# Patient Record
Sex: Female | Born: 1960 | Race: White | Hispanic: No | Smoking: Never smoker
Health system: Southern US, Community
[De-identification: ages and names within clinical notes are randomized; demographics above are authoritative.]

## PROBLEM LIST (undated history)

## (undated) DIAGNOSIS — F419 Anxiety disorder, unspecified: Secondary | ICD-10-CM

## (undated) DIAGNOSIS — E119 Type 2 diabetes mellitus without complications: Secondary | ICD-10-CM

## (undated) DIAGNOSIS — E7800 Pure hypercholesterolemia, unspecified: Secondary | ICD-10-CM

## (undated) DIAGNOSIS — F329 Major depressive disorder, single episode, unspecified: Secondary | ICD-10-CM

## (undated) DIAGNOSIS — G4733 Obstructive sleep apnea (adult) (pediatric): Secondary | ICD-10-CM

## (undated) DIAGNOSIS — F32A Depression, unspecified: Secondary | ICD-10-CM

## (undated) HISTORY — DX: Type 2 diabetes mellitus without complications: E11.9

## (undated) HISTORY — PX: TONSILLECTOMY: SUR1361

## (undated) HISTORY — PX: ABDOMINAL SURGERY: SHX537

## (undated) HISTORY — DX: Obstructive sleep apnea (adult) (pediatric): G47.33

---
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
## 2006-06-24 HISTORY — PX: APPENDECTOMY: SHX54

## 2007-11-20 ENCOUNTER — Emergency Department (HOSPITAL_COMMUNITY): Admission: EM | Admit: 2007-11-20 | Discharge: 2007-11-20 | Payer: Self-pay | Admitting: Emergency Medicine

## 2007-11-28 ENCOUNTER — Emergency Department (HOSPITAL_COMMUNITY): Admission: EM | Admit: 2007-11-28 | Discharge: 2007-11-28 | Payer: Self-pay | Admitting: Family Medicine

## 2008-01-03 ENCOUNTER — Emergency Department (HOSPITAL_COMMUNITY): Admission: EM | Admit: 2008-01-03 | Discharge: 2008-01-03 | Payer: Self-pay | Admitting: Emergency Medicine

## 2008-01-12 ENCOUNTER — Ambulatory Visit: Payer: Self-pay | Admitting: Internal Medicine

## 2008-01-12 LAB — CONVERTED CEMR LAB
Calcium: 9.8 mg/dL (ref 8.4–10.5)
GC Probe Amp, Urine: NEGATIVE
Sodium: 141 meq/L (ref 135–145)
TSH: 0.985 microintl units/mL (ref 0.350–4.50)

## 2008-01-13 ENCOUNTER — Encounter (INDEPENDENT_AMBULATORY_CARE_PROVIDER_SITE_OTHER): Payer: Self-pay | Admitting: Internal Medicine

## 2008-01-17 ENCOUNTER — Ambulatory Visit (HOSPITAL_COMMUNITY): Admission: RE | Admit: 2008-01-17 | Discharge: 2008-01-17 | Payer: Self-pay | Admitting: Internal Medicine

## 2008-01-23 ENCOUNTER — Ambulatory Visit: Payer: Self-pay | Admitting: *Deleted

## 2008-11-29 ENCOUNTER — Ambulatory Visit: Payer: Self-pay | Admitting: Family Medicine

## 2009-02-22 ENCOUNTER — Ambulatory Visit: Payer: Self-pay | Admitting: Internal Medicine

## 2009-02-22 ENCOUNTER — Encounter: Payer: Self-pay | Admitting: Internal Medicine

## 2009-02-22 LAB — CONVERTED CEMR LAB: Chlamydia, DNA Probe: NEGATIVE

## 2009-03-13 ENCOUNTER — Ambulatory Visit: Payer: Self-pay | Admitting: Internal Medicine

## 2009-08-30 ENCOUNTER — Ambulatory Visit: Payer: Self-pay | Admitting: Internal Medicine

## 2010-09-22 ENCOUNTER — Inpatient Hospital Stay (INDEPENDENT_AMBULATORY_CARE_PROVIDER_SITE_OTHER)
Admission: RE | Admit: 2010-09-22 | Discharge: 2010-09-22 | Disposition: A | Discharge status: Home / Self Care | Payer: Self-pay | Source: Ambulatory Visit | Attending: Emergency Medicine | Admitting: Emergency Medicine

## 2010-09-22 DIAGNOSIS — M659 Synovitis and tenosynovitis, unspecified: Secondary | ICD-10-CM

## 2010-10-31 ENCOUNTER — Emergency Department (HOSPITAL_COMMUNITY)
Admission: EM | Admit: 2010-10-31 | Discharge: 2010-11-01 | Disposition: A | Discharge status: Other Acute Inpatient Hospital | Payer: Self-pay | Attending: Emergency Medicine | Admitting: Emergency Medicine

## 2010-10-31 DIAGNOSIS — F3289 Other specified depressive episodes: Secondary | ICD-10-CM | POA: Insufficient documentation

## 2010-10-31 DIAGNOSIS — F329 Major depressive disorder, single episode, unspecified: Secondary | ICD-10-CM | POA: Insufficient documentation

## 2010-10-31 DIAGNOSIS — R45851 Suicidal ideations: Secondary | ICD-10-CM | POA: Insufficient documentation

## 2010-10-31 LAB — COMPREHENSIVE METABOLIC PANEL
ALT: 21 U/L (ref 0–35)
AST: 17 U/L (ref 0–37)
Albumin: 3.4 g/dL — ABNORMAL LOW (ref 3.5–5.2)
Alkaline Phosphatase: 41 U/L (ref 39–117)
BUN: 21 mg/dL (ref 6–23)
Chloride: 108 mEq/L (ref 96–112)
Potassium: 4.3 mEq/L (ref 3.5–5.1)
Sodium: 141 mEq/L (ref 135–145)
Total Bilirubin: 0.3 mg/dL (ref 0.3–1.2)

## 2010-10-31 LAB — CBC
HCT: 45.3 % (ref 36.0–46.0)
MCHC: 32 g/dL (ref 30.0–36.0)
RDW: 14.2 % (ref 11.5–15.5)

## 2010-10-31 LAB — DIFFERENTIAL
Basophils Absolute: 0.1 10*3/uL (ref 0.0–0.1)
Basophils Relative: 1 % (ref 0–1)
Eosinophils Relative: 3 % (ref 0–5)
Lymphocytes Relative: 33 % (ref 12–46)
Monocytes Absolute: 1 10*3/uL (ref 0.1–1.0)

## 2010-10-31 LAB — RAPID URINE DRUG SCREEN, HOSP PERFORMED
Opiates: NOT DETECTED
Tetrahydrocannabinol: NOT DETECTED

## 2010-11-01 ENCOUNTER — Inpatient Hospital Stay (HOSPITAL_COMMUNITY)
Admission: RE | Admit: 2010-11-01 | Discharge: 2010-11-02 | DRG: 881 | Disposition: A | Discharge status: Home / Self Care | Payer: PRIVATE HEALTH INSURANCE | Source: Intra-hospital | Attending: Psychiatry | Admitting: Psychiatry

## 2010-11-01 DIAGNOSIS — E669 Obesity, unspecified: Secondary | ICD-10-CM

## 2010-11-01 DIAGNOSIS — F411 Generalized anxiety disorder: Secondary | ICD-10-CM

## 2010-11-01 DIAGNOSIS — R7309 Other abnormal glucose: Secondary | ICD-10-CM

## 2010-11-01 DIAGNOSIS — F3289 Other specified depressive episodes: Principal | ICD-10-CM

## 2010-11-01 DIAGNOSIS — F1410 Cocaine abuse, uncomplicated: Secondary | ICD-10-CM

## 2010-11-01 DIAGNOSIS — F4310 Post-traumatic stress disorder, unspecified: Secondary | ICD-10-CM

## 2010-11-01 DIAGNOSIS — R45851 Suicidal ideations: Secondary | ICD-10-CM

## 2010-11-01 DIAGNOSIS — F603 Borderline personality disorder: Secondary | ICD-10-CM

## 2010-11-01 DIAGNOSIS — F329 Major depressive disorder, single episode, unspecified: Principal | ICD-10-CM

## 2010-11-01 DIAGNOSIS — K219 Gastro-esophageal reflux disease without esophagitis: Secondary | ICD-10-CM
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
## 2011-03-20 LAB — URINALYSIS, ROUTINE W REFLEX MICROSCOPIC
Bilirubin Urine: NEGATIVE
Glucose, UA: NEGATIVE
Hgb urine dipstick: NEGATIVE
Specific Gravity, Urine: 1.025
pH: 5.5

## 2011-03-20 LAB — POCT PREGNANCY, URINE: Preg Test, Ur: NEGATIVE

## 2011-03-21 LAB — POCT PREGNANCY, URINE
Operator id: 239701
Preg Test, Ur: NEGATIVE

## 2011-03-21 LAB — POCT URINALYSIS DIP (DEVICE)
Bilirubin Urine: NEGATIVE
Glucose, UA: NEGATIVE
Ketones, ur: NEGATIVE
Nitrite: NEGATIVE
Operator id: 239701

## 2011-09-04 ENCOUNTER — Other Ambulatory Visit: Payer: Self-pay

## 2011-09-04 ENCOUNTER — Emergency Department (HOSPITAL_COMMUNITY): Payer: Self-pay

## 2011-09-04 ENCOUNTER — Encounter (HOSPITAL_COMMUNITY): Payer: Self-pay

## 2011-09-04 ENCOUNTER — Emergency Department (HOSPITAL_COMMUNITY)
Admission: EM | Admit: 2011-09-04 | Discharge: 2011-09-04 | Disposition: A | Discharge status: Home / Self Care | Payer: Self-pay | Attending: Emergency Medicine | Admitting: Emergency Medicine

## 2011-09-04 DIAGNOSIS — E7800 Pure hypercholesterolemia, unspecified: Secondary | ICD-10-CM | POA: Insufficient documentation

## 2011-09-04 DIAGNOSIS — M79609 Pain in unspecified limb: Secondary | ICD-10-CM | POA: Insufficient documentation

## 2011-09-04 DIAGNOSIS — G43909 Migraine, unspecified, not intractable, without status migrainosus: Secondary | ICD-10-CM | POA: Insufficient documentation

## 2011-09-04 DIAGNOSIS — R071 Chest pain on breathing: Secondary | ICD-10-CM | POA: Insufficient documentation

## 2011-09-04 DIAGNOSIS — F341 Dysthymic disorder: Secondary | ICD-10-CM | POA: Insufficient documentation

## 2011-09-04 DIAGNOSIS — R110 Nausea: Secondary | ICD-10-CM | POA: Insufficient documentation

## 2011-09-04 DIAGNOSIS — R0789 Other chest pain: Secondary | ICD-10-CM

## 2011-09-04 DIAGNOSIS — M25519 Pain in unspecified shoulder: Secondary | ICD-10-CM | POA: Insufficient documentation

## 2011-09-04 HISTORY — DX: Major depressive disorder, single episode, unspecified: F32.9

## 2011-09-04 HISTORY — DX: Depression, unspecified: F32.A

## 2011-09-04 HISTORY — DX: Pure hypercholesterolemia, unspecified: E78.00

## 2011-09-04 HISTORY — DX: Anxiety disorder, unspecified: F41.9

## 2011-09-04 LAB — DIFFERENTIAL
Basophils Relative: 1 % (ref 0–1)
Lymphs Abs: 1.3 10*3/uL (ref 0.7–4.0)
Monocytes Relative: 17 % — ABNORMAL HIGH (ref 3–12)
Neutro Abs: 5 10*3/uL (ref 1.7–7.7)
Neutrophils Relative %: 64 % (ref 43–77)

## 2011-09-04 LAB — RAPID URINE DRUG SCREEN, HOSP PERFORMED
Amphetamines: NOT DETECTED
Barbiturates: NOT DETECTED
Benzodiazepines: NOT DETECTED
Cocaine: NOT DETECTED
Tetrahydrocannabinol: NOT DETECTED

## 2011-09-04 LAB — COMPREHENSIVE METABOLIC PANEL
ALT: 32 U/L (ref 0–35)
Albumin: 3.5 g/dL (ref 3.5–5.2)
Alkaline Phosphatase: 40 U/L (ref 39–117)
BUN: 15 mg/dL (ref 6–23)
Chloride: 105 mEq/L (ref 96–112)
Potassium: 4.7 mEq/L (ref 3.5–5.1)
Sodium: 138 mEq/L (ref 135–145)
Total Bilirubin: 0.5 mg/dL (ref 0.3–1.2)
Total Protein: 7.6 g/dL (ref 6.0–8.3)

## 2011-09-04 LAB — CBC
Hemoglobin: 15 g/dL (ref 12.0–15.0)
MCHC: 33.2 g/dL (ref 30.0–36.0)
Platelets: 268 10*3/uL (ref 150–400)
RBC: 5.01 MIL/uL (ref 3.87–5.11)

## 2011-09-04 LAB — POCT I-STAT TROPONIN I: Troponin i, poc: 0 ng/mL (ref 0.00–0.08)

## 2011-09-04 LAB — D-DIMER, QUANTITATIVE: D-Dimer, Quant: 0.32 ug/mL-FEU (ref 0.00–0.48)

## 2011-09-04 MED ORDER — DIPHENHYDRAMINE HCL 50 MG/ML IJ SOLN
25.0000 mg | Freq: Once | INTRAMUSCULAR | Status: AC
  Administered 2011-09-04: 09:00:00 via INTRAVENOUS
  Filled 2011-09-04: qty 2

## 2011-09-04 MED ORDER — DIAZEPAM 5 MG PO TABS: 5.0000 mg | ORAL_TABLET | Freq: Two times a day (BID) | ORAL | Status: DC

## 2011-09-04 MED ORDER — NAPROXEN 500 MG PO TABS: 500.0000 mg | ORAL_TABLET | Freq: Two times a day (BID) | ORAL | Status: DC

## 2011-09-04 MED ORDER — METOCLOPRAMIDE HCL 5 MG/ML IJ SOLN
10.0000 mg | Freq: Once | INTRAMUSCULAR | Status: AC
  Administered 2011-09-04: 10 mg via INTRAVENOUS
  Filled 2011-09-04: qty 2

## 2011-09-04 MED ORDER — KETOROLAC TROMETHAMINE 30 MG/ML IJ SOLN
30.0000 mg | Freq: Once | INTRAMUSCULAR | Status: AC
  Administered 2011-09-04: 30 mg via INTRAVENOUS
  Filled 2011-09-04: qty 1

## 2011-09-04 MED ORDER — DEXAMETHASONE SODIUM PHOSPHATE 10 MG/ML IJ SOLN
10.0000 mg | Freq: Once | INTRAMUSCULAR | Status: AC
  Administered 2011-09-04: 10 mg via INTRAVENOUS
  Filled 2011-09-04: qty 1

## 2011-09-04 NOTE — ED Notes (Addendum)
Chest pain increases with any pressure or movement.  Pt denies nausea, vomiting sweating.  Pt states ibuprofen has not helped the headache. Pt reports MI 7 yrs ago and the pain is similar. Pt reports having EKG on 9th of march.  Pt states [facility] "didnt like" the EKG and wanted her to have stress test but pt has not scheduled.

## 2011-09-04 NOTE — ED Provider Notes (Signed)
Medical screening examination/treatment/procedure(s) were performed by non-physician practitioner and as supervising physician I was immediately available for consultation/collaboration. Devoria Albe, MD, Armando Gang   Ward Givens, MD 09/04/11 [phone]

## 2011-09-04 NOTE — ED Notes (Signed)
Pt presents with 1 month h/o mid-sternal chest tightness.  Pt reports pain has been constant, radiates into L arm and straight through to mid-scapula.  -shortness of breath.  Pt reports productive cough with clear phlegm x 3-4 days.  Migraine headache started x 2 days ago that begins to frontal area and radiates to occipital area.  +nausea and photophobia.

## 2011-09-04 NOTE — ED Provider Notes (Signed)
History     CSN: [id number]  Arrival date & time 09/04/11  4098   None     Chief Complaint  Patient presents with  . Chest Pain    (Consider location/radiation/quality/duration/timing/severity/associated sxs/prior treatment) The history is provided by the patient.  Pt presents with 1 month of chest tightness to the mid sternal area. Described as sharp, stabbing. The pain seems to be constant in nature and is not positional. Seems to get mildly worse with deep breaths. Occasionally radiates into the L arm, shoulder. Denies associated dyspnea, diaphoresis. Admits occasional SOB with walking long distances which does not seem related to pain. Denies hemoptysis but has had productive cough (clear phlegm) for past 4 days. Denies f/c. No n/v/d. Patient has no prior history of DVT/PE. Denies recent trauma, surgery, or prolonged immobilization. Denies hemoptysis or use of exogenous estrogen. Denies leg swelling. Has been taking ibuprofen with no relief. States she has hx of MI seven years ago for which she was txed in [place]. Does not think she received a stent. Is not currently followed by cards as she does not have insurance.   Additionally c/o migraine HA. Has hx of the same. States this started 2 days ago at occiput, radiates to frontal area bilaterally, which is consistent with previous. Denies neck pain, stiffness. She has associated nausea without emesis. Additionally complains of photophobia and phonophobia. Denies difficulty walking, syncope.  Past Medical History  Diagnosis Date  . Depression   . Anxiety   . Migraine   . Hypercholesteremia   . Endometriosis     Past Surgical History  Procedure Date  . Appendectomy   . Abdominal surgery   . Tonsillectomy     No family history on file.  History  Substance Use Topics  . Smoking status: Never Smoker   . Smokeless tobacco: Not on file  . Alcohol Use: No    OB History    Grav Para Term Preterm Abortions TAB SAB Ect Mult Living                    Review of Systems  Constitutional: Negative for fever, chills, activity change and appetite change.  HENT: Negative for congestion, sore throat, facial swelling, rhinorrhea, neck pain and neck stiffness.   Eyes: Negative for photophobia.  Respiratory: Negative for chest tightness and shortness of breath.   Cardiovascular: Positive for chest pain. Negative for palpitations and leg swelling.  Gastrointestinal: Positive for nausea. Negative for vomiting and abdominal pain.  Genitourinary: Negative.   Musculoskeletal: Negative for myalgias.  Skin: Negative for color change and rash.  Neurological: Positive for headaches. Negative for dizziness, seizures, speech difficulty, weakness and numbness.    Allergies  Review of patient's allergies indicates not on file.  Home Medications  No current outpatient prescriptions on file.  BP 138/79  Pulse 110  Temp(Src) 100.1 F (37.8 C) (Oral)  Resp 22  Ht 5\' 2"  (1.575 m)  Wt 225 lb (102.059 kg)  BMI 41.15 kg/m2  SpO2 94% Patient's HR on monitor while I was examining her is 90.  Physical Exam  Nursing note and vitals reviewed. Constitutional: She is oriented to person, place, and time. She appears well-developed and well-nourished. No distress.  HENT:  Head: Normocephalic and atraumatic.  Right Ear: External ear normal.  Left Ear: External ear normal.  Mouth/Throat: Oropharynx is clear and moist. No oropharyngeal exudate.       Scalp nontender to palp, no sinus tenderness  Eyes: Conjunctivae and  EOM are normal. Pupils are equal, round, and reactive to light.  Neck: Normal range of motion. Neck supple. No muscular tenderness present. No Brudzinski's sign and no Kernig's sign noted.  Cardiovascular: Normal rate, regular rhythm and normal heart sounds.   Pulmonary/Chest: Effort normal and breath sounds normal. She exhibits tenderness.       Reproducible chest pain over midsternum  Abdominal: Soft. Bowel sounds are  normal. There is no tenderness.  Musculoskeletal: Normal range of motion. She exhibits no edema and no tenderness.       nontender to calf palpation  Neurological: She is alert and oriented to person, place, and time. She displays normal reflexes. No cranial nerve deficit. She exhibits normal muscle tone. Coordination normal.  Skin: Skin is warm and dry. No rash noted. She is not diaphoretic.  Psychiatric: She has a normal mood and affect.    ED Course  Procedures (including critical care time)   Date: 09/04/2011  Rate: 110  Rhythm: sinus tachycardia and premature ventricular contractions (PVC)  QRS Axis: normal  Intervals: normal  ST/T Wave abnormalities: normal  Conduction Disutrbances:none  Narrative Interpretation:   Old EKG Reviewed: none available    Labs Reviewed  DIFFERENTIAL - Abnormal; Notable for the following:    Monocytes Relative 17 (*)    Monocytes Absolute 1.3 (*)    All other components within normal limits  COMPREHENSIVE METABOLIC PANEL - Abnormal; Notable for the following:    Glucose, Bld 107 (*)    GFR calc non Af Amer 76 (*)    GFR calc Af Amer 89 (*)    All other components within normal limits  D-DIMER, QUANTITATIVE  CBC  POCT I-STAT TROPONIN I  URINE RAPID DRUG SCREEN (HOSP PERFORMED)   Dg Chest 2 View  09/04/2011  *RADIOLOGY REPORT*  Clinical Data: Chest pain.  CHEST - 2 VIEW  Comparison: No priors.  Findings: Lung volumes are normal.  No consolidative airspace disease.  No pleural effusions.  No pneumothorax.  No pulmonary nodule or mass noted.  Pulmonary vasculature and the cardiomediastinal silhouette are within normal limits. Atherosclerotic calcifications within the arch of the aorta. Mild elevation of right hemidiaphragm.  IMPRESSION: 1. No radiographic evidence of acute cardiopulmonary disease. 2.  Atherosclerosis. 3.  Mild elevation of right hemidiaphragm.  Original Report Authenticated By: Florencia Reasons, M.D.     1. Migraine   2.  Chest wall pain       MDM  Old records reviewed. Office visit from March 2012 indicates that the patient has a history of cocaine abuse. UDS performed today was negative. Pt given migraine cocktail of Decadron/Reglan/Benadryl which almost completely relieved her pain. She was given Toradol as well and reported analgesia with this for both her HA and CP. Given the timing of sx of CP, suspicion for ACS lowered. Pain is reproducible to palpation. Will treat as poss chest wall pain. Pt was encouraged to establish care with [facility] for f/u. Strict return precautions discussed.       Grant Fontana, [place] 09/04/11 1614

## 2011-09-04 NOTE — Discharge Instructions (Signed)
Your labs today were normal. Please make a follow up appointment with cardiology. Return to the ED immediately for worsening pain, high fever, or other worrisome symptoms.  RESOURCE GUIDE  Dental Problems  Patients with Medicaid: [facility] [phone] [address]                                           [phone] [address] Phone:  [phone]                                                  Phone:  [phone]  If unable to pay or uninsured, contact:  [facility] or [facility]. to become qualified for the adult dental clinic.  Chronic Pain Problems Contact [facility]  [phone] Patients need to be referred by their primary care doctor.  Insufficient Money for Medicine Contact [employer]:  call "211" or [facility] [phone].  No Primary Care Doctor Call [facility]  [phone] Other agencies that provide inexpensive medical care    [facility]  [phone]    [facility]  [phone]    [facility]  [phone]    [facility]  [phone]    [facility]  [phone]    [facility]  [phone]  Psychological Services [facility]  [phone] [facility]  [phone] [facility]   [phone] (emergency services [phone])  Substance Abuse Resources [facility]  [phone] [facility] [phone] [facility] [phone] Floydene Flock ([phone] Residential & Outpatient Substance Abuse Program  ([phone]  Abuse/Neglect [facility] Child Abuse Hotline (864) 533-4609 [facility] Child Abuse Hotline 782-178-5191 (After Hours)  Emergency Shelter [facility] [phone]  [facility] [phone] [facility] 6696828798  MRSA Hotline #:   (856) 090-8731    [facility]  Resources  [facility]     [employer]                          [facility]. [address] [place]                       [address]      [address]  [place]                                                Cristobal Goldmann Phone:  [phone]                                   Phone:  [phone]                 Phone:  [phone]  [facility] Phone:  [phone]  [facility] Child Abuse Hotline 385-660-9796 978-414-9671 (After Hours)  Chest Pain (Nonspecific) Chest pain has many causes. Your pain could be caused by something serious, such as a heart attack or a blood clot in the lungs. It could also be caused by something less serious, such as a chest bruise or a virus. Follow up with your doctor. More lab tests or other studies may be needed to find the cause of your pain. Most of the time, nonspecific chest pain will improve within 2 to 3 days of rest and mild pain medicine. HOME CARE  For chest bruises, you may put ice on the sore area for 15 to 20 minutes, 3 to 4 times a day. Do this only if it makes you or your child feel better.   Put ice in a plastic bag.   Place a towel between the skin and the bag.   Rest for the next 2 to 3 days.   Go back to work if the pain improves.   See your doctor if the pain lasts longer than 1 to 2 weeks.   Only take medicine as told by your doctor.   Quit smoking if you smoke.  GET HELP RIGHT AWAY IF:   There is more pain or pain that spreads to the arm, neck, jaw, back, or belly (abdomen).   You or your child has shortness of breath.   You or your child coughs more than usual or coughs up blood.   You or your child has very bad back or belly pain, feels sick to his or her stomach (nauseous), or throws up (vomits).   You or your child has very bad weakness.   You or your child passes out (faints).   You or your child has a  temperature by mouth above 102 F (38.9 C), not controlled by medicine.  Any of these problems may be serious and may be an emergency. Do not wait to see if the problems will go away. Get medical help right away. Call your local emergency services 911 in [place] Do not drive yourself to the hospital. MAKE SURE YOU:   Understand these instructions.   Will watch this condition.   Will get help right away if you or your child is not doing well or gets worse.  Document Released: 11/27/2007 Document Revised: 05/30/2011 Document Reviewed: 11/27/2007 [facility] Patient Information 2012 [place], [place].  Migraine Headache A migraine headache is an intense, throbbing pain on one or both sides of your head. The exact cause of a migraine headache is not always known. A migraine may be caused when nerves in the brain become irritated and release chemicals that cause swelling within blood vessels, causing pain. Many migraine sufferers have a family history of migraines. Before you get a migraine you may or may not get an aura. An aura is a group of symptoms that can predict the beginning of a migraine. An aura may include:  Visual changes such as:   Flashing lights.   Bright spots or zig-zag lines.   Tunnel vision.   Feelings of numbness.   Trouble talking.   Muscle weakness.  SYMPTOMS  Pain on one or both sides of your head.   Pain that is pulsating or throbbing in nature.   Pain that is severe enough to prevent  daily activities.   Pain that is aggravated by any daily physical activity.   Nausea (feeling sick to your stomach), vomiting, or both.   Pain with exposure to bright lights, loud noises, or activity.   General sensitivity to bright lights or loud noises.  MIGRAINE TRIGGERS Examples of triggers of migraine headaches include:   Alcohol.   Smoking.   Stress.   It may be related to menses (female menstruation).   Aged cheeses.   Foods or drinks that contain nitrates,  glutamate, aspartame, or tyramine.   Lack of sleep.   Chocolate.   Caffeine.   Hunger.   Medications such as nitroglycerine (used to treat chest pain), birth control pills, estrogen, and some blood pressure medications.  DIAGNOSIS  A migraine headache is often diagnosed based on:  Symptoms.   Physical examination.   A computerized X-ray scan (computed tomography, CT) of your head.  TREATMENT  Medications can help prevent migraines if they are recurrent or should they become recurrent. Your caregiver can help you with a medication or treatment program that will be helpful to you.   Lying down in a dark, quiet room may be helpful.   Keeping a headache diary may help you find a trend as to what may be triggering your headaches.  SEEK IMMEDIATE MEDICAL CARE IF:   You have confusion, personality changes or seizures.   You have headaches that wake you from sleep.   You have an increased frequency in your headaches.   You have a stiff neck.   You have a loss of vision.   You have muscle weakness.   You start losing your balance or have trouble walking.   You feel faint or pass out.  MAKE SURE YOU:   Understand these instructions.   Will watch your condition.   Will get help right away if you are not doing well or get worse.  Document Released: 06/10/2005 Document Revised: 05/30/2011 Document Reviewed: 01/24/2009 [facility] Patient Information 2012 [place], [place].

## 2011-09-06 ENCOUNTER — Emergency Department (HOSPITAL_COMMUNITY): Payer: Self-pay

## 2011-09-06 ENCOUNTER — Encounter (HOSPITAL_COMMUNITY): Payer: Self-pay

## 2011-09-06 ENCOUNTER — Other Ambulatory Visit: Payer: Self-pay

## 2011-09-06 ENCOUNTER — Emergency Department (HOSPITAL_COMMUNITY)
Admission: EM | Admit: 2011-09-06 | Discharge: 2011-09-06 | Disposition: A | Discharge status: Home / Self Care | Payer: Self-pay | Attending: Emergency Medicine | Admitting: Emergency Medicine

## 2011-09-06 DIAGNOSIS — R059 Cough, unspecified: Secondary | ICD-10-CM | POA: Insufficient documentation

## 2011-09-06 DIAGNOSIS — M940 Chondrocostal junction syndrome [Tietze]: Secondary | ICD-10-CM | POA: Insufficient documentation

## 2011-09-06 DIAGNOSIS — Z79899 Other long term (current) drug therapy: Secondary | ICD-10-CM | POA: Insufficient documentation

## 2011-09-06 DIAGNOSIS — R071 Chest pain on breathing: Secondary | ICD-10-CM | POA: Insufficient documentation

## 2011-09-06 DIAGNOSIS — E7800 Pure hypercholesterolemia, unspecified: Secondary | ICD-10-CM | POA: Insufficient documentation

## 2011-09-06 DIAGNOSIS — J40 Bronchitis, not specified as acute or chronic: Secondary | ICD-10-CM | POA: Insufficient documentation

## 2011-09-06 DIAGNOSIS — R0789 Other chest pain: Secondary | ICD-10-CM

## 2011-09-06 DIAGNOSIS — R110 Nausea: Secondary | ICD-10-CM | POA: Insufficient documentation

## 2011-09-06 DIAGNOSIS — R5381 Other malaise: Secondary | ICD-10-CM | POA: Insufficient documentation

## 2011-09-06 DIAGNOSIS — R0602 Shortness of breath: Secondary | ICD-10-CM | POA: Insufficient documentation

## 2011-09-06 DIAGNOSIS — F341 Dysthymic disorder: Secondary | ICD-10-CM | POA: Insufficient documentation

## 2011-09-06 DIAGNOSIS — R509 Fever, unspecified: Secondary | ICD-10-CM | POA: Insufficient documentation

## 2011-09-06 DIAGNOSIS — R05 Cough: Secondary | ICD-10-CM | POA: Insufficient documentation

## 2011-09-06 LAB — DIFFERENTIAL
Basophils Relative: 1 % (ref 0–1)
Lymphs Abs: 1.6 10*3/uL (ref 0.7–4.0)
Monocytes Absolute: 0.6 10*3/uL (ref 0.1–1.0)
Monocytes Relative: 8 % (ref 3–12)
Neutro Abs: 5.5 10*3/uL (ref 1.7–7.7)
Neutrophils Relative %: 71 % (ref 43–77)

## 2011-09-06 LAB — BASIC METABOLIC PANEL
BUN: 20 mg/dL (ref 6–23)
Chloride: 104 mEq/L (ref 96–112)
Creatinine, Ser: 1.07 mg/dL (ref 0.50–1.10)
GFR calc Af Amer: 69 mL/min — ABNORMAL LOW (ref >90)
Glucose, Bld: 96 mg/dL (ref 70–99)
Potassium: 4 mEq/L (ref 3.5–5.1)

## 2011-09-06 LAB — CBC
HCT: 45.6 % (ref 36.0–46.0)
Hemoglobin: 14.4 g/dL (ref 12.0–15.0)
MCH: 28.6 pg (ref 26.0–34.0)
MCHC: 31.6 g/dL (ref 30.0–36.0)
RBC: 5.03 MIL/uL (ref 3.87–5.11)

## 2011-09-06 LAB — APTT: aPTT: 29 seconds (ref 24–37)

## 2011-09-06 LAB — CARDIAC PANEL(CRET KIN+CKTOT+MB+TROPI): Relative Index: 1.4 (ref 0.0–2.5)

## 2011-09-06 MED ORDER — MOXIFLOXACIN HCL 400 MG PO TABS
400.0000 mg | ORAL_TABLET | Freq: Once | ORAL | Status: AC
  Administered 2011-09-06: 400 mg via ORAL
  Filled 2011-09-06: qty 1

## 2011-09-06 MED ORDER — SODIUM CHLORIDE 0.9 % IV SOLN: INTRAVENOUS | Status: DC

## 2011-09-06 MED ORDER — HYDROCODONE-ACETAMINOPHEN 5-325 MG PO TABS
2.0000 | ORAL_TABLET | Freq: Once | ORAL | Status: DC
  Filled 2011-09-06: qty 1

## 2011-09-06 MED ORDER — MORPHINE SULFATE 4 MG/ML IJ SOLN
4.0000 mg | Freq: Once | INTRAMUSCULAR | Status: AC
  Administered 2011-09-06: 4 mg via INTRAVENOUS
  Filled 2011-09-06: qty 1

## 2011-09-06 MED ORDER — ALBUTEROL SULFATE HFA 108 (90 BASE) MCG/ACT IN AERS
2.0000 | INHALATION_SPRAY | RESPIRATORY_TRACT | Status: DC | PRN
  Administered 2011-09-06: 2 via RESPIRATORY_TRACT
  Filled 2011-09-06: qty 6.7

## 2011-09-06 MED ORDER — PREDNISONE 20 MG PO TABS
60.0000 mg | ORAL_TABLET | Freq: Once | ORAL | Status: AC
  Administered 2011-09-06: 60 mg via ORAL
  Filled 2011-09-06: qty 3

## 2011-09-06 MED ORDER — ASPIRIN 81 MG PO CHEW: 324.0000 mg | CHEWABLE_TABLET | Freq: Once | ORAL | Status: DC

## 2011-09-06 MED ORDER — SODIUM CHLORIDE 0.9 % IV BOLUS (SEPSIS)
1000.0000 mL | Freq: Once | INTRAVENOUS | Status: AC
  Administered 2011-09-06: 1000 mL via INTRAVENOUS

## 2011-09-06 MED ORDER — PREDNISONE 20 MG PO TABS: 40.0000 mg | ORAL_TABLET | Freq: Every day | ORAL | Status: AC

## 2011-09-06 MED ORDER — ALBUTEROL SULFATE HFA 108 (90 BASE) MCG/ACT IN AERS: 2.0000 | INHALATION_SPRAY | RESPIRATORY_TRACT | Status: AC | PRN

## 2011-09-06 MED ORDER — MOXIFLOXACIN HCL 400 MG PO TABS: 400.0000 mg | ORAL_TABLET | Freq: Every day | ORAL | Status: AC

## 2011-09-06 MED ORDER — HYDROCODONE-ACETAMINOPHEN 5-325 MG PO TABS: 2.0000 | ORAL_TABLET | ORAL | Status: AC | PRN

## 2011-09-06 NOTE — ED Notes (Signed)
Pt vomited previously ingested sandwich. Pt was assisted in changing. Ginger Ale was offered.

## 2011-09-06 NOTE — ED Notes (Signed)
Patient states she is feeling better and ready to be discharged.

## 2011-09-06 NOTE — ED Notes (Signed)
Pt has been resting comfortably, denies any pain at present

## 2011-09-06 NOTE — ED Notes (Signed)
Patient going to try to drink ginger ale, then will be discharged.

## 2011-09-06 NOTE — ED Notes (Signed)
Dr Connor at bedside 

## 2011-09-06 NOTE — ED Provider Notes (Signed)
History     CSN: [id number]  Arrival date & time 09/06/11  1012   First MD Initiated Contact with Patient 09/06/11 1019      Chief Complaint  Patient presents with  . Chest Pain    (Consider location/radiation/quality/duration/timing/severity/associated sxs/prior treatment) Patient is a 51 y.o. female presenting with chest pain. The history is provided by the patient.  Chest Pain The chest pain began 1 - 2 weeks ago. Duration of episode(s) is 1 week. Chest pain occurs constantly. The chest pain is unchanged. The pain is associated with breathing and exertion. The severity of the pain is moderate. The quality of the pain is described as dull, similar to previous episodes and pressure-like. The pain does not radiate. Chest pain is worsened by deep breathing and exertion. Primary symptoms include a fever, fatigue, shortness of breath, cough and nausea. Pertinent negatives for primary symptoms include no syncope, no wheezing, no palpitations, no abdominal pain, no vomiting, no dizziness and no altered mental status.  Pertinent negatives for associated symptoms include no claudication, no diaphoresis, no lower extremity edema, no near-syncope, no numbness, no orthopnea and no weakness. She tried nothing for the symptoms. Risk factors: Hyperlipidemia, father with early coronary artery disease.  Her past medical history is significant for hyperlipidemia.  Pertinent negatives for past medical history include no CAD, no congenital heart disease, no COPD, no CHF, no diabetes, no DVT, no hypertension, no MI and no PE.  Her family medical history is significant for CAD in family.     Past Medical History  Diagnosis Date  . Depression   . Anxiety   . Migraine   . Hypercholesteremia   . Endometriosis     Past Surgical History  Procedure Date  . Appendectomy   . Abdominal surgery   . Tonsillectomy   . Splenectomy     No family history on file.  History  Substance Use Topics  . Smoking  status: Never Smoker   . Smokeless tobacco: Not on file  . Alcohol Use: No    OB History    Grav Para Term Preterm Abortions TAB SAB Ect Mult Living                  Review of Systems  Constitutional: Positive for fever and fatigue. Negative for chills, diaphoresis, activity change and appetite change.  HENT: Negative.   Eyes: Negative.   Respiratory: Positive for cough, chest tightness and shortness of breath. Negative for wheezing.   Cardiovascular: Positive for chest pain. Negative for palpitations, orthopnea, claudication, leg swelling, syncope and near-syncope.  Gastrointestinal: Positive for nausea. Negative for vomiting, abdominal pain, constipation and abdominal distention.  Genitourinary: Negative.   Musculoskeletal: Negative for myalgias, joint swelling and arthralgias.  Skin: Negative for color change, pallor and rash.  Neurological: Negative for dizziness, syncope, weakness, light-headedness and numbness.  Hematological: Negative.  Does not bruise/bleed easily.  Psychiatric/Behavioral: Negative.  Negative for altered mental status.    Allergies  Gabapentin and Iodine  Home Medications   Current Outpatient Rx  Name Route Sig Dispense Refill  . HYDROXYZINE PAMOATE 25 MG PO CAPS Oral Take 25 mg by mouth 2 (two) times daily as needed. For anxiety    . OMEPRAZOLE 20 MG PO CPDR Oral Take 40 mg by mouth daily.    . SERTRALINE HCL 100 MG PO TABS Oral Take 150 mg by mouth every morning.      BP 101/59  Pulse 70  Temp(Src) 98.4 F (36.9 C) (Oral)  Resp 18  Ht 5\' 2"  (1.575 m)  Wt 234 lb (106.142 kg)  BMI 42.80 kg/m2  SpO2 100%  Physical Exam  Nursing note and vitals reviewed. Constitutional: She is oriented to person, place, and time. She appears well-nourished. No distress.  HENT:  Head: Normocephalic and atraumatic.  Mouth/Throat: Oropharynx is clear and moist.  Eyes: EOM are normal. Pupils are equal, round, and reactive to light.  Neck: Normal range of  motion. Neck supple. No JVD present. No tracheal deviation present.  Cardiovascular: Normal rate, regular rhythm, S1 normal, S2 normal, normal heart sounds and intact distal pulses.   No extrasystoles are present. PMI is not displaced.  Exam reveals no gallop and no friction rub.   No murmur heard. Pulmonary/Chest: Effort normal and breath sounds normal. No accessory muscle usage or stridor. Not tachypneic. No respiratory distress. She has no decreased breath sounds. She has no wheezes. She has no rhonchi. She has no rales. She exhibits tenderness and bony tenderness. She exhibits no crepitus, no deformity and no retraction.       The patient has reproduction of her chest pain, augmentation of her chest pain with palpation of the left chest wall and costal chondral joints suggestive of chest wall pain.  Abdominal: Soft. Bowel sounds are normal. She exhibits no distension and no mass. There is no tenderness. There is no rebound and no guarding.  Musculoskeletal: Normal range of motion. She exhibits no edema and no tenderness.  Neurological: She is alert and oriented to person, place, and time. No cranial nerve deficit. She exhibits normal muscle tone.  Skin: Skin is warm and dry. No rash noted. She is not diaphoretic. No erythema. No pallor.  Psychiatric: She has a normal mood and affect. Her behavior is normal. Judgment and thought content normal.    ED Course  Procedures (including critical care time)   Date: 09/06/2011  Rate: 72  Rhythm: normal sinus rhythm  QRS Axis: normal  Intervals: normal  ST/T Wave abnormalities: nonspecific ST/T changes  Conduction Disutrbances:none  Narrative Interpretation: No significant changes from prior ECG or suggestion of myocardial ischemia.  Old EKG Reviewed: unchanged    Labs Reviewed  BASIC METABOLIC PANEL - Abnormal; Notable for the following:    GFR calc non Af Amer 59 (*)    GFR calc Af Amer 69 (*)    All other components within normal limits    CARDIAC PANEL(CRET KIN+CKTOT+MB+TROPI)  CBC  DIFFERENTIAL  PROTIME-INR  APTT   Dg Chest 2 View  09/06/2011  *RADIOLOGY REPORT*  Clinical Data: Chest pain.  Midsternal pain.  CHEST - 2 VIEW  Comparison: 09/04/2011  Findings: Cardiomegaly.  Mild peribronchial thickening.  Low lung volumes without confluent opacity or effusion.  IMPRESSION: Cardiomegaly.  Bronchitic changes.  Original Report Authenticated By: Cyndie Chime, M.D.     No diagnosis found.    MDM  Of course acute coronary syndrome and myocardial ischemia or myocardial infarction are considered in evaluating the patient's symptoms. However, she has no EKG changes, and her cardiac enzymes are negative at more than a week of constant symptoms, and that an upper respiratory tract infection with symptoms suggestive of bronchitis, and reproduction of her pain on palpation of the chest wall suggestive of costochondritis and chest wall pain. The patient has only a history of hyperlipidemia and a family history of coronary artery disease but does not smoke and does not have diabetes, hypertension, or known history of coronary artery disease. At this time I  do not think that her pain is due to myocardial infarction, unstable angina, or acute coronary syndrome. I perceive the patient to have bronchitis and chest wall pain. She was evaluated for these same complaints 2 days ago in the emergency department and found to have chest wall pain as well.        Felisa Bonier, MD 09/06/11 1147

## 2011-09-06 NOTE — ED Notes (Signed)
Pt was brought in by EMS with c/o on and off chest pain x 1 week. Pt was seen here last Wednesday for the same problem. Pt claimed that the chest pain is tight with nausea. Pt was given ASA 324 mg with 1 tab of NTG. The pain when down from 8/10-5/10

## 2011-09-06 NOTE — Discharge Instructions (Signed)
Bronchitis Bronchitis is a problem of the air tubes leading to your lungs. This problem makes it hard for air to get in and out of the lungs. You may cough a lot because your air tubes are narrow. Going without care can cause lasting (chronic) bronchitis. HOME CARE   Drink enough fluids to keep your pee (urine) clear or pale yellow.   Use a cool mist humidifier.   Quit smoking if you smoke. If you keep smoking, the bronchitis might not get better.   Only take medicine as told by your doctor.  GET HELP RIGHT AWAY IF:   Coughing keeps you awake.   You start to wheeze.   You become more sick or weak.   You have a hard time breathing or get short of breath.   You cough up blood.   Coughing lasts more than 2 weeks.   You have a fever.   Your baby is older than 3 months with a rectal temperature of 102 F (38.9 C) or higher.   Your baby is 57 months old or younger with a rectal temperature of 100.4 F (38 C) or higher.  MAKE SURE YOU:  Understand these instructions.   Will watch your condition.   Will get help right away if you are not doing well or get worse.  Document Released: 11/27/2007 Document Revised: 05/30/2011 Document Reviewed: 05/12/2009 [facility] Patient Information 2012 [place], [place].Chest Pain (Nonspecific) Chest pain has many causes. Your pain could be caused by something serious, such as a heart attack or a blood clot in the lungs. It could also be caused by something less serious, such as a chest bruise or a virus. Follow up with your doctor. More lab tests or other studies may be needed to find the cause of your pain. Most of the time, nonspecific chest pain will improve within 2 to 3 days of rest and mild pain medicine. HOME CARE  For chest bruises, you may put ice on the sore area for 15 to 20 minutes, 3 to 4 times a day. Do this only if it makes you or your child feel better.   Put ice in a plastic bag.   Place a towel between the skin and the bag.    Rest for the next 2 to 3 days.   Go back to work if the pain improves.   See your doctor if the pain lasts longer than 1 to 2 weeks.   Only take medicine as told by your doctor.   Quit smoking if you smoke.  GET HELP RIGHT AWAY IF:   There is more pain or pain that spreads to the arm, neck, jaw, back, or belly (abdomen).   You or your child has shortness of breath.   You or your child coughs more than usual or coughs up blood.   You or your child has very bad back or belly pain, feels sick to his or her stomach (nauseous), or throws up (vomits).   You or your child has very bad weakness.   You or your child passes out (faints).   You or your child has a temperature by mouth above 102 F (38.9 C), not controlled by medicine.  Any of these problems may be serious and may be an emergency. Do not wait to see if the problems will go away. Get medical help right away. Call your local emergency services 911 in [place] Do not drive yourself to the hospital. MAKE SURE YOU:   Understand  these instructions.   Will watch this condition.   Will get help right away if you or your child is not doing well or gets worse.  Document Released: 11/27/2007 Document Revised: 05/30/2011 Document Reviewed: 11/27/2007 [facility] Patient Information 2012 [place], [place].Chest Wall Pain Chest wall pain is pain in or around the bones and muscles of your chest. It may take up to 6 weeks to get better. It may take longer if you must stay physically active in your work and activities.  CAUSES  Chest wall pain may happen on its own. However, it may be caused by:  A viral illness like the flu.   Injury.   Coughing.   Exercise.   Arthritis.   Fibromyalgia.   Shingles.  HOME CARE INSTRUCTIONS   Avoid overtiring physical activity. Try not to strain or perform activities that cause pain. This includes any activities using your chest or your abdominal and side muscles, especially if heavy weights are  used.   Put ice on the sore area.   Put ice in a plastic bag.   Place a towel between your skin and the bag.   Leave the ice on for 15 to 20 minutes per hour while awake for the first 2 days.   Only take over-the-counter or prescription medicines for pain, discomfort, or fever as directed by your caregiver.  SEEK IMMEDIATE MEDICAL CARE IF:   Your pain increases, or you are very uncomfortable.   You have a fever.   Your chest pain becomes worse.   You have new, unexplained symptoms.   You have nausea or vomiting.   You feel sweaty or lightheaded.   You have a cough with phlegm (sputum), or you cough up blood.  MAKE SURE YOU:   Understand these instructions.   Will watch your condition.   Will get help right away if you are not doing well or get worse.  Document Released: 06/10/2005 Document Revised: 05/30/2011 Document Reviewed: 02/04/2011 [facility] Patient Information 2012 [place], [place].Costochondritis Costochondritis (Tietze syndrome), or costochondral separation, is a swelling and irritation (inflammation) of the tissue (cartilage) that connects your ribs with your breastbone (sternum). It may occur on its own (spontaneously), through damage caused by an accident (trauma), or simply from coughing or minor exercise. It may take up to 6 weeks to get better and longer if you are unable to be conservative in your activities. HOME CARE INSTRUCTIONS   Avoid exhausting physical activity. Try not to strain your ribs during normal activity. This would include any activities using chest, belly (abdominal), and side muscles, especially if heavy weights are used.   Use ice for 15 to 20 minutes per hour while awake for the first 2 days. Place the ice in a plastic bag, and place a towel between the bag of ice and your skin.   Only take over-the-counter or prescription medicines for pain, discomfort, or fever as directed by your caregiver.  SEEK IMMEDIATE MEDICAL CARE IF:   Your pain  increases or you are very uncomfortable.   You have a fever.   You develop difficulty with your breathing.   You cough up blood.   You develop worse chest pains, shortness of breath, sweating, or vomiting.   You develop new, unexplained problems (symptoms).  MAKE SURE YOU:   Understand these instructions.   Will watch your condition.   Will get help right away if you are not doing well or get worse.  Document Released: 03/20/2005 Document Revised: 05/30/2011 Document Reviewed: 01/27/2008  [employer] Patient Information 2012 [place], [place]. RESOURCE GUIDE  Dental Problems  Patients with Medicaid: [facility] [id number]                                           [phone] [address] Phone:  [phone]                                                  Phone:  [phone]  If unable to pay or uninsured, contact:  [facility] or [facility]. to become qualified for the adult dental clinic.  Chronic Pain Problems Contact [facility]  [phone] Patients need to be referred by their primary care doctor.  Insufficient Money for Medicine Contact [employer]:  call "211" or [facility] [phone].  No Primary Care Doctor Call [facility]  365-336-4993 Other agencies that provide inexpensive medical care    [facility]  [phone]    [facility]  [phone]    [facility]  [phone]    [facility]  [phone]    [facility]  [phone]    [facility]  [phone]  Psychological Services [facility]  [phone] [facility]  [phone] [facility]   [phone] (emergency services [phone])  Substance Abuse Resources [facility]  [phone] [facility] [phone] [facility] [phone] Floydene Flock [phone] Residential & Outpatient Substance Abuse  Program  ([phone]  Abuse/Neglect [facility] Child Abuse Hotline 6670610916 [facility] Child Abuse Hotline [phone] (After Hours)  Emergency Shelter [facility] [phone]  Maternity Homes [facility] [place] [facility] 725-336-3333 [facility] [phone]  MRSA Hotline #:   508-869-2345    [facility] Resources  [facility] [place]     [employer]                          [facility] Dept. [address] [place]                       [address]      [address]  [place]                                                Cristobal Goldmann Phone:  [phone]                                   Phone:  [phone]                 Phone:  [phone]  [facility] Mental [facility] Phone:  [phone]  [facility] Child Abuse Hotline [phone])580-0485 [phone] (After Hours)

## 2011-12-11 ENCOUNTER — Encounter (HOSPITAL_COMMUNITY): Payer: Self-pay | Admitting: Emergency Medicine

## 2011-12-11 ENCOUNTER — Emergency Department (HOSPITAL_COMMUNITY): Payer: Medicaid Other

## 2011-12-11 ENCOUNTER — Observation Stay (HOSPITAL_COMMUNITY)
Admission: EM | Admit: 2011-12-11 | Discharge: 2011-12-12 | Disposition: A | Discharge status: Home / Self Care | Payer: Medicaid Other | Source: Ambulatory Visit | Attending: Emergency Medicine | Admitting: Emergency Medicine

## 2011-12-11 DIAGNOSIS — R079 Chest pain, unspecified: Principal | ICD-10-CM | POA: Insufficient documentation

## 2011-12-11 DIAGNOSIS — R5381 Other malaise: Secondary | ICD-10-CM | POA: Insufficient documentation

## 2011-12-11 DIAGNOSIS — E669 Obesity, unspecified: Secondary | ICD-10-CM | POA: Insufficient documentation

## 2011-12-11 DIAGNOSIS — E785 Hyperlipidemia, unspecified: Secondary | ICD-10-CM | POA: Insufficient documentation

## 2011-12-11 DIAGNOSIS — F411 Generalized anxiety disorder: Secondary | ICD-10-CM | POA: Insufficient documentation

## 2011-12-11 DIAGNOSIS — E7800 Pure hypercholesterolemia, unspecified: Secondary | ICD-10-CM | POA: Insufficient documentation

## 2011-12-11 LAB — POCT I-STAT TROPONIN I
Troponin i, poc: 0.01 ng/mL (ref 0.00–0.08)
Troponin i, poc: 0 ng/mL (ref 0.00–0.08)

## 2011-12-11 LAB — DIFFERENTIAL
Basophils Absolute: 0.1 10*3/uL (ref 0.0–0.1)
Eosinophils Relative: 5 % (ref 0–5)
Lymphocytes Relative: 29 % (ref 12–46)
Lymphs Abs: 3.5 10*3/uL (ref 0.7–4.0)
Monocytes Absolute: 1.1 10*3/uL — ABNORMAL HIGH (ref 0.1–1.0)
Monocytes Relative: 9 % (ref 3–12)
Neutro Abs: 6.9 10*3/uL (ref 1.7–7.7)

## 2011-12-11 LAB — CBC
HCT: 45 % (ref 36.0–46.0)
Hemoglobin: 15 g/dL (ref 12.0–15.0)
MCV: 89.5 fL (ref 78.0–100.0)
RBC: 5.03 MIL/uL (ref 3.87–5.11)
WBC: 12.1 10*3/uL — ABNORMAL HIGH (ref 4.0–10.5)

## 2011-12-11 LAB — BASIC METABOLIC PANEL
BUN: 17 mg/dL (ref 6–23)
CO2: 25 mEq/L (ref 19–32)
Calcium: 9.5 mg/dL (ref 8.4–10.5)
Chloride: 103 mEq/L (ref 96–112)
Creatinine, Ser: 1.01 mg/dL (ref 0.50–1.10)
Glucose, Bld: 105 mg/dL — ABNORMAL HIGH (ref 70–99)

## 2011-12-11 MED ORDER — NITROGLYCERIN 0.4 MG SL SUBL
0.4000 mg | SUBLINGUAL_TABLET | SUBLINGUAL | Status: DC | PRN
Start: 2011-12-11 — End: 2011-12-12
  Administered 2011-12-11: 0.4 mg via SUBLINGUAL
  Filled 2011-12-11: qty 25

## 2011-12-11 MED ORDER — OXYCODONE-ACETAMINOPHEN 5-325 MG PO TABS
2.0000 | ORAL_TABLET | Freq: Once | ORAL | Status: AC
  Administered 2011-12-11: 2 via ORAL
  Filled 2011-12-11: qty 2

## 2011-12-11 MED ORDER — MORPHINE SULFATE 4 MG/ML IJ SOLN
4.0000 mg | Freq: Once | INTRAMUSCULAR | Status: AC
  Administered 2011-12-11: 4 mg via INTRAVENOUS
  Filled 2011-12-11: qty 1

## 2011-12-11 NOTE — ED Provider Notes (Signed)
Date: 12/11/2011  Rate: 83  Rhythm: normal sinus rhythm  QRS Axis: normal  Intervals: normal QRS:  Poor R wave progression in the precordial leads suggests old anterior myocardial infarction.  ST/T Wave abnormalities: normal  Conduction Disutrbances:none  Narrative Interpretation: Abnormal EKG  Old EKG Reviewed: unchanged    Carleene Cooper III, MD 12/11/11 [phone]

## 2011-12-11 NOTE — ED Provider Notes (Signed)
History     CSN: [id number]  Arrival date & time 12/11/11  1412   First MD Initiated Contact with Patient 12/11/11 1816      Chief Complaint  Patient presents with  . Chest Pain    (Consider location/radiation/quality/duration/timing/severity/associated sxs/prior treatment) Patient is a 51 y.o. female presenting with chest pain.  Chest Pain Primary symptoms include fatigue. Pertinent negatives for primary symptoms include no fever, no syncope, no shortness of breath, no cough, no wheezing, no palpitations, no abdominal pain, no nausea, no vomiting, no dizziness and no altered mental status.  Pertinent negatives for associated symptoms include no lower extremity edema, no numbness and no weakness. She tried nothing for the symptoms. Risk factors include obesity.  Her past medical history is significant for anxiety/panic attacks and hyperlipidemia.  Pertinent negatives for past medical history include no aneurysm, no arrhythmia, no CAD, no cancer, no COPD, no CHF, no diabetes, no DVT, no hypertension, no MI, no seizures, no sleep apnea, no strokes and no TIA.  Her family medical history is significant for diabetes in family, hyperlipidemia in family, hypertension in family, early MI in family, PE in family and stroke in family.  Pertinent negatives for family medical history include: no sudden death in family and no TIA in family.  Procedure history is negative for cardiac catheterization, echocardiogram, persantine thallium, stress echo, stress thallium and exercise treadmill test.    51 year old female coming in with ongoing chest pain x4 months. States that the pressure is in the midsternal epigastric area and radiates to her left upper extremities intermittently. Patient has been to the hospital twice before inventory with bronchitis GERD and anxiety. She's states that the pain is worse with exertion. States that the pain is constant pressure and benefits sharp radiating pain  intermittently with exertion. States that in the past nitroglycerin has helped her pain. States that her father died of a massive MI in his early 37s. No nausea vomiting or diaphoresis. No long trips shortness of breath no calf pain. She is a [facility] the patient.  Past Medical History  Diagnosis Date  . Depression   . Anxiety   . Migraine   . Hypercholesteremia   . Endometriosis     Past Surgical History  Procedure Date  . Appendectomy   . Abdominal surgery   . Tonsillectomy   . Splenectomy     History reviewed. No pertinent family history.  History  Substance Use Topics  . Smoking status: Never Smoker   . Smokeless tobacco: Not on file  . Alcohol Use: No    OB History    Grav Para Term Preterm Abortions TAB SAB Ect Mult Living                  Review of Systems  Constitutional: Positive for fatigue. Negative for fever.  HENT: Negative.   Eyes: Negative.   Respiratory: Negative.  Negative for cough, shortness of breath and wheezing.   Cardiovascular: Positive for chest pain. Negative for palpitations, leg swelling and syncope.  Gastrointestinal: Negative.  Negative for nausea, vomiting, abdominal pain and abdominal distention.  Genitourinary: Negative for dysuria and vaginal discharge.  Neurological: Negative.  Negative for dizziness, seizures, weakness and numbness.  Psychiatric/Behavioral: Negative.  Negative for altered mental status.  All other systems reviewed and are negative.    Allergies  Gabapentin and Iodine  Home Medications   Current Outpatient Rx  Name Route Sig Dispense Refill  . ALBUTEROL SULFATE HFA 108 (90 BASE) MCG/ACT  IN AERS Inhalation Inhale 2 puffs into the lungs every 4 (four) hours as needed for wheezing or shortness of breath (cough). 1 Inhaler 0  . BUPROPION HCL ER (XL) 300 MG PO TB24 Oral Take 600 mg by mouth daily.    Marland Kitchen HYDROXYZINE PAMOATE 25 MG PO CAPS Oral Take 25 mg by mouth 2 (two) times daily as needed. For anxiety    .  OMEPRAZOLE 20 MG PO CPDR Oral Take 40 mg by mouth daily.    . SERTRALINE HCL 100 MG PO TABS Oral Take 150 mg by mouth every evening.       BP 140/92  Pulse 79  Temp 98.1 F (36.7 C) (Oral)  Resp 18  SpO2 100%  Physical Exam  Nursing note and vitals reviewed. Constitutional: She is oriented to person, place, and time. She appears well-developed and well-nourished.  HENT:  Head: Normocephalic and atraumatic.  Eyes: Conjunctivae and EOM are normal. Pupils are equal, round, and reactive to light.  Neck: Normal range of motion. Neck supple.  Cardiovascular: Normal rate.   Pulmonary/Chest: Effort normal.  Abdominal: Soft.  Musculoskeletal: Normal range of motion. She exhibits no edema and no tenderness.  Neurological: She is alert and oriented to person, place, and time. She has normal reflexes.  Skin: Skin is warm and dry.  Psychiatric: She has a normal mood and affect.    ED Course  Procedures (including critical care time)  Labs Reviewed  CBC - Abnormal; Notable for the following:    WBC 12.1 (*)     All other components within normal limits  DIFFERENTIAL - Abnormal; Notable for the following:    Monocytes Absolute 1.1 (*)     All other components within normal limits  BASIC METABOLIC PANEL - Abnormal; Notable for the following:    Glucose, Bld 105 (*)     GFR calc non Af Amer 63 (*)     GFR calc Af Amer 73 (*)     All other components within normal limits  POCT I-STAT TROPONIN I  URINALYSIS, ROUTINE W REFLEX MICROSCOPIC   Dg Chest 2 View  12/11/2011  *RADIOLOGY REPORT*  Clinical Data: Mid chest pain radiating to the right shoulder  CHEST - 2 VIEW  Comparison: 09/06/2011  Findings: Cardiomediastinal silhouette is within normal limits. The lungs are clear. No pleural effusion.  No pneumothorax.  No acute osseous abnormality.  IMPRESSION: Normal chest.  Original Report Authenticated By: Harrel Lemon, M.D.     No diagnosis found.    MDM  Patient will move to CDU  for the chest pain protocol. BMI is 38.9 so she will have the stress test instead of the CT of the chest. Report given to [facility]. Chest pain protocol orders are in. Patient was given a sublingual monitor and her pain was worse. Aspirin ordered. Past medical history depression anxiety denies suicidal ideation or homicidal ideations. She does not smoke never has. The pain has been constant for 4 months worse with exertion epigastric midsternal external radiating to her left upper extremity. Troponin is negative EKG shows no changes. Chest x-ray unremarkable.   Labs Reviewed  CBC - Abnormal; Notable for the following:    WBC 12.1 (*)     All other components within normal limits  DIFFERENTIAL - Abnormal; Notable for the following:    Monocytes Absolute 1.1 (*)     All other components within normal limits  BASIC METABOLIC PANEL - Abnormal; Notable for the following:  Glucose, Bld 105 (*)     GFR calc non Af Amer 63 (*)     GFR calc Af Amer 73 (*)     All other components within normal limits  POCT I-STAT TROPONIN I  URINALYSIS, ROUTINE W REFLEX MICROSCOPIC  LIPASE, BLOOD         Remi Haggard, NP 12/11/11 1932

## 2011-12-11 NOTE — ED Notes (Signed)
Pt states that she was having stabbing pain in her chest since this morning, pt states she did not take anything for the pain. Pt states her pain right now in her mid chest area is a 6

## 2011-12-11 NOTE — ED Provider Notes (Signed)
Pt moved to CDU on CP protocol. 4 mos of intermittent epigastric pain worse with exertion. NTG has helped pain in past. +fam hx CAD. 3 negative troponins, pain controlled. Pt awaiting stress echo in AM. Currently no complaints, resting comfortably. Signed out to Dr. Preston Fleeting, Pod A overnight MD.  Grant Fontana, PA-C 12/12/11 [phone]

## 2011-12-11 NOTE — ED Notes (Signed)
Pt reports epigastric pain x 4 months. Pt seen and treated for gastric reflux and anxiety. Reports prescribed medications ineffective.

## 2011-12-11 NOTE — Progress Notes (Signed)
Medical screening examination/treatment/procedure(s) were conducted as a shared visit with non-physician practitioner(s) and myself.  I personally evaluated the patient during the encounter 51 year old woman with recurrent episodes of chest pain.  Family history positive for coronary artery disease.  Exam shows her to be an obese middle aged woman in mild distress.  Heart and lungs clear.  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 Recommend admission to CDU Observation for the chest pain protocol.

## 2011-12-12 DIAGNOSIS — R072 Precordial pain: Secondary | ICD-10-CM

## 2011-12-12 LAB — POCT I-STAT TROPONIN I: Troponin i, poc: 0 ng/mL (ref 0.00–0.08)

## 2011-12-12 MED ORDER — ACETAMINOPHEN 325 MG PO TABS
650.0000 mg | ORAL_TABLET | Freq: Once | ORAL | Status: AC
  Administered 2011-12-12: 650 mg via ORAL
  Filled 2011-12-12: qty 2

## 2011-12-12 NOTE — ED Provider Notes (Signed)
11:23 AM Assumed care of patient in the CDU.  Patient on CPP.  Patient has had intermittent epigastric pain for the past 4 months.  Patient reports that her pain has improved at this time.  Patient had a normal stress echo this morning and three negative troponins.  Patient in no acute distress.  Alert and orientated x 3, Heart RRR, Lungs CTAB, no lower extremity edema.  Will discharge patient home.  Patient in agreement with the plan.  Return precautions discussed.    Pascal Lux [place], PA-C 12/12/11 1143

## 2011-12-12 NOTE — ED Provider Notes (Signed)
Medical screening examination/treatment/procedure(s) were performed by non-physician practitioner and as supervising physician I was immediately available for consultation/collaboration.   Semya Klinke B. Bernette Mayers, MD 12/12/11 1147

## 2011-12-12 NOTE — ED Notes (Signed)
Pt moved from POD A 6 to CDU 7. Report received

## 2011-12-12 NOTE — ED Provider Notes (Signed)
Medical screening examination/treatment/procedure(s) were performed by non-physician practitioner and as supervising physician I was immediately available for consultation/collaboration.   Carleene Cooper III, MD 12/12/11 1249

## 2011-12-12 NOTE — ED Provider Notes (Signed)
Medical screening examination/treatment/procedure(s) were conducted as a shared visit with non-physician practitioner(s) and myself.  I personally evaluated the patient during the encounter screening examination/treatment/procedure(s) were conducted as a shared visit with non-physician practitioner(s) and myself. I personally evaluated the patient during the encounter  51 year old woman with recurrent episodes of chest pain. Family history positive for coronary artery disease. Exam shows her to be an obese middle aged woman in mild distress. Heart and lungs clear. 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 Recommend admission to CDU Observation for the chest pain protocol.        Carleene Cooper III, MD 12/12/11 1249

## 2011-12-12 NOTE — Progress Notes (Signed)
Observation review is complete. 

## 2011-12-12 NOTE — Discharge Instructions (Signed)
The Stress Echocardiogram that you had done of your heart today was normal.  Follow up with your Primary Care Physician regarding your hospital visit today.  Read instructions below for reasons to return to the Emergency Department. It is recommended that your follow up with your Primary Care Doctor in regards to today's visit. If you do not have a doctor, use the resource guide listed below to help you find one. Begin taking over the counter Prilosec or Zegrid as directed.   Chest Pain (Nonspecific)  HOME CARE INSTRUCTIONS  For the next few days, avoid physical activities that bring on chest pain. Continue physical activities as directed.  Do not smoke cigarettes or drink alcohol until your symptoms are gone.  Only take over-the-counter or prescription medicine for pain, discomfort, or fever as directed by your caregiver.  Follow your caregiver's suggestions for further testing if your chest pain does not go away.  Keep any follow-up appointments you made. If you do not go to an appointment, you could develop lasting (chronic) problems with pain. If there is any problem keeping an appointment, you must call to reschedule.  SEEK MEDICAL CARE IF:  You think you are having problems from the medicine you are taking. Read your medicine instructions carefully.  Your chest pain does not go away, even after treatment.  You develop a rash with blisters on your chest.  SEEK IMMEDIATE MEDICAL CARE IF:  You have increased chest pain or pain that spreads to your arm, neck, jaw, back, or belly (abdomen).  You develop shortness of breath, an increasing cough, or you are coughing up blood.  You have severe back or abdominal pain, feel sick to your stomach (nauseous) or throw up (vomit).  You develop severe weakness, fainting, or chills.  You have an oral temperature above 102 F (38.9 C), not controlled by medicine.   THIS IS AN EMERGENCY. Do not wait to see if the pain will go away. Get medical help at  once. Call your local emergency services (911 in [place] Do not drive yourself to the hospital.   RESOURCE GUIDE  Dental Problems  Patients with Medicaid: [facility] [phone] [address]                                           [phone] [address] Phone:  [phone]                                                  Phone:  [phone]  If unable to pay or uninsured, contact:  [facility] or [facility]. to become qualified for the adult dental clinic.  Chronic Pain Problems Contact [facility]  [phone] Patients need to be referred by their primary care doctor.  Insufficient Money for Medicine Contact [employer]:  call "211" or [facility] [phone].  No Primary Care Doctor Call [facility]  [phone] Other agencies that provide inexpensive medical care    [facility]  [phone]    [facility]  [phone]    [facility]  [phone]    Women's Clinic  [phone]    [facility]  714-820-3826    [facility]  [phone]  Psychological Services [facility]  [phone] [facility]  [phone] [facility]   [phone] (emergency services [phone])  Substance Abuse Resources [facility]  817-834-6942 [facility] 901-035-5803 [facility] ([phone] Floyd[facility] Flock [phone] Residential & Outpatient Substance Abuse Program  [phone]  Abuse/Neglect [facility] Child Abuse Hotline 207 343 2136 [facility] Child Abuse Hotline 870-397-8371 (After Hours)  Emergency Shelter [facility] [phone] 042-9442  Maternity Homes Room at the [place] [facility] [place] (220)507-7218 [facility] [phone]  MRSA Hotline #:   479-026-7357    [facility] Resources  [facility] [place]     [employer]                           [facility] Dept. [address] [place]                       [address]      [address]  Blondell Reveal Phone:  [phone]                                   Phone:  [phone]                 Phone:  [phone]  [facility] Phone:  [phone]  [facility] Child Abuse Hotline [phone] (After Hours)

## 2011-12-12 NOTE — ED Provider Notes (Signed)
Date: 12/12/2011  Rate: 67  Rhythm: normal sinus rhythm  QRS Axis: normal  Intervals: PR prolonged  ST/T Wave abnormalities: normal  Conduction Disutrbances:first-degree A-V block   Narrative Interpretation: Poor R-wave progression, borderline low voltage. When compared with ECG of 12/11/2011, no significant changes are seen.  Old EKG Reviewed: unchanged    Dione Booze, MD 12/12/11 [phone]

## 2011-12-12 NOTE — ED Notes (Signed)
PA IN TO DISCUSS RESULTS WITH PT AND FAMILY

## 2011-12-12 NOTE — Progress Notes (Signed)
  Echocardiogram 2D Echocardiogram has been performed.  Monica Welch 12/12/2011, 9:45 AM

## 2012-04-13 ENCOUNTER — Emergency Department (HOSPITAL_COMMUNITY): Payer: Medicaid Other

## 2012-04-13 ENCOUNTER — Emergency Department (HOSPITAL_COMMUNITY)
Admission: EM | Admit: 2012-04-13 | Discharge: 2012-04-13 | Disposition: A | Discharge status: Home / Self Care | Payer: Medicaid Other | Attending: Emergency Medicine | Admitting: Emergency Medicine

## 2012-04-13 ENCOUNTER — Encounter (HOSPITAL_COMMUNITY): Payer: Self-pay | Admitting: Emergency Medicine

## 2012-04-13 DIAGNOSIS — X500XXA Overexertion from strenuous movement or load, initial encounter: Secondary | ICD-10-CM | POA: Insufficient documentation

## 2012-04-13 DIAGNOSIS — E7800 Pure hypercholesterolemia, unspecified: Secondary | ICD-10-CM | POA: Insufficient documentation

## 2012-04-13 DIAGNOSIS — R071 Chest pain on breathing: Secondary | ICD-10-CM | POA: Insufficient documentation

## 2012-04-13 DIAGNOSIS — S86919A Strain of unspecified muscle(s) and tendon(s) at lower leg level, unspecified leg, initial encounter: Secondary | ICD-10-CM

## 2012-04-13 DIAGNOSIS — Z79899 Other long term (current) drug therapy: Secondary | ICD-10-CM | POA: Insufficient documentation

## 2012-04-13 DIAGNOSIS — Z8742 Personal history of other diseases of the female genital tract: Secondary | ICD-10-CM | POA: Insufficient documentation

## 2012-04-13 DIAGNOSIS — F329 Major depressive disorder, single episode, unspecified: Secondary | ICD-10-CM | POA: Insufficient documentation

## 2012-04-13 DIAGNOSIS — Z791 Long term (current) use of non-steroidal anti-inflammatories (NSAID): Secondary | ICD-10-CM | POA: Insufficient documentation

## 2012-04-13 DIAGNOSIS — M7150 Other bursitis, not elsewhere classified, unspecified site: Secondary | ICD-10-CM | POA: Insufficient documentation

## 2012-04-13 DIAGNOSIS — Z8669 Personal history of other diseases of the nervous system and sense organs: Secondary | ICD-10-CM | POA: Insufficient documentation

## 2012-04-13 DIAGNOSIS — IMO0002 Reserved for concepts with insufficient information to code with codable children: Secondary | ICD-10-CM | POA: Insufficient documentation

## 2012-04-13 DIAGNOSIS — R0789 Other chest pain: Secondary | ICD-10-CM

## 2012-04-13 DIAGNOSIS — Y939 Activity, unspecified: Secondary | ICD-10-CM | POA: Insufficient documentation

## 2012-04-13 DIAGNOSIS — F411 Generalized anxiety disorder: Secondary | ICD-10-CM | POA: Insufficient documentation

## 2012-04-13 DIAGNOSIS — F3289 Other specified depressive episodes: Secondary | ICD-10-CM | POA: Insufficient documentation

## 2012-04-13 DIAGNOSIS — Y929 Unspecified place or not applicable: Secondary | ICD-10-CM | POA: Insufficient documentation

## 2012-04-13 DIAGNOSIS — M719 Bursopathy, unspecified: Secondary | ICD-10-CM

## 2012-04-13 LAB — BASIC METABOLIC PANEL
Chloride: 107 mEq/L (ref 96–112)
GFR calc non Af Amer: 76 mL/min — ABNORMAL LOW (ref >90)
Glucose, Bld: 131 mg/dL — ABNORMAL HIGH (ref 70–99)
Potassium: 4.5 mEq/L (ref 3.5–5.1)
Sodium: 145 mEq/L (ref 135–145)

## 2012-04-13 LAB — CBC
Hemoglobin: 15.6 g/dL — ABNORMAL HIGH (ref 12.0–15.0)
MCHC: 33.5 g/dL (ref 30.0–36.0)
RBC: 5.19 MIL/uL — ABNORMAL HIGH (ref 3.87–5.11)
WBC: 11.5 10*3/uL — ABNORMAL HIGH (ref 4.0–10.5)

## 2012-04-13 MED ORDER — IBUPROFEN 600 MG PO TABS: 600.0000 mg | ORAL_TABLET | Freq: Three times a day (TID) | ORAL | Status: DC

## 2012-04-13 MED ORDER — IBUPROFEN 800 MG PO TABS
800.0000 mg | ORAL_TABLET | Freq: Once | ORAL | Status: AC
  Administered 2012-04-13: 800 mg via ORAL
  Filled 2012-04-13: qty 1

## 2012-04-13 MED ORDER — ASPIRIN 81 MG PO CHEW
324.0000 mg | CHEWABLE_TABLET | Freq: Once | ORAL | Status: AC
  Administered 2012-04-13: 324 mg via ORAL
  Filled 2012-04-13: qty 4

## 2012-04-13 NOTE — ED Provider Notes (Signed)
Medical screening examination/treatment/procedure(s) were performed by non-physician practitioner and as supervising physician I was immediately available for consultation/collaboration.   Suzi Roots, MD 04/13/12 [phone]
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
## 2012-04-13 NOTE — ED Notes (Addendum)
Pt has had chest pressure for 1 week, sts pain is radiating to right shoulder, denies any SOB; pt having tingling in both hands and legs.  Pt complaining of nausea, denies any vomiting.

## 2012-04-13 NOTE — ED Notes (Signed)
Pt transported to xray 

## 2012-10-01 ENCOUNTER — Other Ambulatory Visit: Payer: Self-pay

## 2012-10-01 DIAGNOSIS — Z1231 Encounter for screening mammogram for malignant neoplasm of breast: Secondary | ICD-10-CM

## 2012-10-30 ENCOUNTER — Ambulatory Visit: Payer: PRIVATE HEALTH INSURANCE

## 2013-01-25 ENCOUNTER — Ambulatory Visit
Admission: RE | Admit: 2013-01-25 | Discharge: 2013-01-25 | Disposition: A | Discharge status: Home / Self Care | Payer: Medicaid Other | Source: Ambulatory Visit

## 2013-01-25 DIAGNOSIS — Z1231 Encounter for screening mammogram for malignant neoplasm of breast: Secondary | ICD-10-CM

## 2013-01-26 ENCOUNTER — Other Ambulatory Visit: Payer: Self-pay | Admitting: Internal Medicine

## 2013-01-26 DIAGNOSIS — R928 Other abnormal and inconclusive findings on diagnostic imaging of breast: Secondary | ICD-10-CM

## 2013-02-11 ENCOUNTER — Ambulatory Visit
Admission: RE | Admit: 2013-02-11 | Discharge: 2013-02-11 | Disposition: A | Discharge status: Home / Self Care | Payer: Medicaid Other | Source: Ambulatory Visit | Attending: Internal Medicine | Admitting: Internal Medicine

## 2013-02-11 DIAGNOSIS — R928 Other abnormal and inconclusive findings on diagnostic imaging of breast: Secondary | ICD-10-CM

## 2013-03-01 ENCOUNTER — Ambulatory Visit (HOSPITAL_BASED_OUTPATIENT_CLINIC_OR_DEPARTMENT_OTHER): Payer: Medicaid Other | Attending: Internal Medicine

## 2013-03-15 ENCOUNTER — Emergency Department (HOSPITAL_COMMUNITY)
Admission: EM | Admit: 2013-03-15 | Discharge: 2013-03-15 | Disposition: A | Discharge status: Home / Self Care | Payer: Self-pay | Attending: Emergency Medicine | Admitting: Emergency Medicine

## 2013-03-15 ENCOUNTER — Emergency Department (HOSPITAL_COMMUNITY): Payer: Self-pay

## 2013-03-15 ENCOUNTER — Encounter (HOSPITAL_COMMUNITY): Payer: Self-pay | Admitting: Emergency Medicine

## 2013-03-15 DIAGNOSIS — Z79899 Other long term (current) drug therapy: Secondary | ICD-10-CM | POA: Insufficient documentation

## 2013-03-15 DIAGNOSIS — R269 Unspecified abnormalities of gait and mobility: Secondary | ICD-10-CM | POA: Insufficient documentation

## 2013-03-15 DIAGNOSIS — X500XXA Overexertion from strenuous movement or load, initial encounter: Secondary | ICD-10-CM | POA: Insufficient documentation

## 2013-03-15 DIAGNOSIS — Z8742 Personal history of other diseases of the female genital tract: Secondary | ICD-10-CM | POA: Insufficient documentation

## 2013-03-15 DIAGNOSIS — M7989 Other specified soft tissue disorders: Secondary | ICD-10-CM | POA: Insufficient documentation

## 2013-03-15 DIAGNOSIS — Z888 Allergy status to other drugs, medicaments and biological substances status: Secondary | ICD-10-CM | POA: Insufficient documentation

## 2013-03-15 DIAGNOSIS — Z8669 Personal history of other diseases of the nervous system and sense organs: Secondary | ICD-10-CM | POA: Insufficient documentation

## 2013-03-15 DIAGNOSIS — F411 Generalized anxiety disorder: Secondary | ICD-10-CM | POA: Insufficient documentation

## 2013-03-15 DIAGNOSIS — Y929 Unspecified place or not applicable: Secondary | ICD-10-CM | POA: Insufficient documentation

## 2013-03-15 DIAGNOSIS — F329 Major depressive disorder, single episode, unspecified: Secondary | ICD-10-CM | POA: Insufficient documentation

## 2013-03-15 DIAGNOSIS — E785 Hyperlipidemia, unspecified: Secondary | ICD-10-CM | POA: Insufficient documentation

## 2013-03-15 DIAGNOSIS — F3289 Other specified depressive episodes: Secondary | ICD-10-CM | POA: Insufficient documentation

## 2013-03-15 DIAGNOSIS — Y939 Activity, unspecified: Secondary | ICD-10-CM | POA: Insufficient documentation

## 2013-03-15 DIAGNOSIS — M79609 Pain in unspecified limb: Secondary | ICD-10-CM | POA: Insufficient documentation

## 2013-03-15 MED ORDER — OXYCODONE-ACETAMINOPHEN 5-325 MG PO TABS: 2.0000 | ORAL_TABLET | ORAL | Status: DC | PRN

## 2013-03-15 MED ORDER — OXYCODONE-ACETAMINOPHEN 5-325 MG PO TABS
2.0000 | ORAL_TABLET | Freq: Once | ORAL | Status: AC
  Administered 2013-03-15: 2 via ORAL
  Filled 2013-03-15: qty 2

## 2013-03-15 NOTE — Progress Notes (Signed)
Orthopedic Tech Progress Note Patient Details:  Monica Welch May 17, 1961 [id number]  Ortho Devices Type of Ortho Device: Crutches Ortho Device/Splint Interventions: Application   Demba Nigh 03/15/2013, 9:30 PM
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
## 2013-03-18 NOTE — ED Provider Notes (Signed)
Medical screening examination/treatment/procedure(s) were performed by non-physician practitioner and as supervising physician I was immediately available for consultation/collaboration.  Keerat Denicola, MD 03/18/13 0614 

## 2013-06-02 ENCOUNTER — Encounter (HOSPITAL_COMMUNITY): Payer: Self-pay | Admitting: Emergency Medicine

## 2013-06-02 ENCOUNTER — Emergency Department (INDEPENDENT_AMBULATORY_CARE_PROVIDER_SITE_OTHER)
Admission: EM | Admit: 2013-06-02 | Discharge: 2013-06-02 | Disposition: A | Discharge status: Home / Self Care | Payer: Self-pay | Source: Home / Self Care | Attending: Emergency Medicine | Admitting: Emergency Medicine

## 2013-06-02 DIAGNOSIS — M5430 Sciatica, unspecified side: Secondary | ICD-10-CM

## 2013-06-02 DIAGNOSIS — M5432 Sciatica, left side: Secondary | ICD-10-CM

## 2013-06-02 MED ORDER — METHYLPREDNISOLONE (PAK) 4 MG PO TABS: ORAL_TABLET | ORAL | Status: DC

## 2013-06-02 NOTE — ED Notes (Addendum)
C/o pain in low back w radiation into her left leg and foot; no known injury; has not been on her Depakote or Zoloft, since her Medicaid is on "hold" until her birthday and/or she pays $2000

## 2013-06-02 NOTE — ED Provider Notes (Signed)
CSN: [id number]     Arrival date & time 06/02/13  1435 History   First MD Initiated Contact with Patient 06/02/13 1509     No chief complaint on file.  (Consider location/radiation/quality/duration/timing/severity/associated sxs/prior Treatment) Patient is a 52 y.o. female presenting with back pain. The history is provided by the patient.  Back Pain Location:  Sacro-iliac joint (left) Quality:  Burning Radiates to:  L thigh and L knee (and left buttock) Pain severity:  Moderate Pain is:  Same all the time Onset quality:  Gradual Duration:  10 days Timing:  Constant Progression:  Unchanged Chronicity:  New Context: not falling, not lifting heavy objects and not recent illness   Exacerbated by: made worse when transitioning from sitting to standing and vice versa. Associated symptoms: no abdominal pain, no bladder incontinence, no bowel incontinence, no dysuria, no fever, no numbness, no paresthesias, no pelvic pain, no perianal numbness, no tingling and no weakness   Risk factors: obesity     Past Medical History  Diagnosis Date  . Depression   . Anxiety   . Migraine   . Hypercholesteremia   . Endometriosis    Past Surgical History  Procedure Laterality Date  . Appendectomy    . Abdominal surgery    . Tonsillectomy    . Splenectomy     No family history on file. History  Substance Use Topics  . Smoking status: Never Smoker   . Smokeless tobacco: Not on file  . Alcohol Use: No   OB History   Grav Para Term Preterm Abortions TAB SAB Ect Mult Living                 Review of Systems  Constitutional: Negative for fever.  HENT: Negative.   Eyes: Negative.   Respiratory: Negative.   Cardiovascular: Negative.   Gastrointestinal: Negative.  Negative for nausea, vomiting, abdominal pain, diarrhea, constipation, blood in stool, rectal pain and bowel incontinence.  Endocrine: Negative for polydipsia, polyphagia and polyuria.  Genitourinary: Negative for bladder  incontinence, dysuria, frequency, hematuria, flank pain, vaginal bleeding, vaginal discharge and pelvic pain.  Musculoskeletal: Positive for back pain.  Skin: Negative.   Allergic/Immunologic: Negative for immunocompromised state.  Neurological: Negative.  Negative for tingling, weakness, numbness and paresthesias.  Hematological: Negative for adenopathy. Does not bruise/bleed easily.  Psychiatric/Behavioral: The patient is nervous/anxious.     Allergies  Gabapentin and Iodine  Home Medications   Current Outpatient Rx  Name  Route  Sig  Dispense  Refill  . divalproex (DEPAKOTE) 500 MG DR tablet   Oral   Take 500 mg by mouth 2 (two) times daily.         Marland Kitchen omeprazole (PRILOSEC) 20 MG capsule   Oral   Take 40 mg by mouth daily.         Marland Kitchen oxyCODONE-acetaminophen (PERCOCET/ROXICET) 5-325 MG per tablet   Oral   Take 2 tablets by mouth every 4 (four) hours as needed for pain.   15 tablet   0   . sertraline (ZOLOFT) 100 MG tablet   Oral   Take 100 mg by mouth daily.         Marland Kitchen zolpidem (AMBIEN) 10 MG tablet   Oral   Take 10 mg by mouth at bedtime as needed. For insomnia          BP 117/81  Pulse 95  Temp(Src) 98 F (36.7 C) (Oral)  Resp 18  SpO2 96% Physical Exam  Nursing note and vitals reviewed.  Constitutional: She is oriented to person, place, and time. She appears well-developed and well-nourished. No distress.  Appears uncomfortable  HENT:  Head: Normocephalic and atraumatic.  Eyes: No scleral icterus.  Neck: Normal range of motion. Neck supple.  Cardiovascular: Normal rate, regular rhythm and normal heart sounds.   Pulmonary/Chest: Effort normal and breath sounds normal.  Abdominal: Soft. Bowel sounds are normal. She exhibits no distension and no mass. There is no tenderness. There is no rebound and no guarding.  Musculoskeletal: Normal range of motion.       Lumbar back: She exhibits tenderness and pain. She exhibits normal range of motion, no bony  tenderness, no swelling, no edema, no deformity, no laceration, no spasm and normal pulse.       Back:  Neurological: She is alert and oriented to person, place, and time. She has normal strength and normal reflexes. No sensory deficit. She exhibits normal muscle tone. Coordination and gait normal.  Reflex Scores:      Patellar reflexes are 2+ on the right side and 2+ on the left side.      Achilles reflexes are 2+ on the right side and 2+ on the left side. Denies saddle anesthesia or bowel incontinence  Skin: Skin is warm and dry. No rash noted.  Psychiatric: She has a normal mood and affect. Her behavior is normal.    ED Course  Procedures (including critical care time) Labs Review Labs Reviewed - No data to display Imaging Review No results found.  EKG Interpretation    Date/Time:    Ventricular Rate:    PR Interval:    QRS Duration:   QT Interval:    QTC Calculation:   R Axis:     Text Interpretation:              MDM    When patient asked specifically about bladder incontinence, she mentions two episodes of urinary incontinence 2-3 weeks ago and states that these occurred several days prior to the beginning of her back discomfort. Educated patient regarding warning signs of cauda equina syndrome and gave her specific instructions regarding re-evaluation at her nearest ER should these symptoms occur, or if she develops fever or severe and persistent pain. Advised her to try the medication (medrol dose pack) prescribed here today and return for re-evaluation either here or with her PCP (Dr. Ginette Otto) is her symptoms do not improve. The patient and I both agreed that I would not prescribe narcotic patient medication as she is a recovering drug addict.   Jess Barters Melba, [place] 06/03/13 [phone]

## 2013-06-03 NOTE — ED Provider Notes (Signed)
Medical screening examination/treatment/procedure(s) were performed by non-physician practitioner and as supervising physician I was immediately available for consultation/collaboration.  Leslee Home, M.D.  Reuben Likes, MD 06/03/13 1003

## 2013-07-17 ENCOUNTER — Emergency Department (HOSPITAL_COMMUNITY)
Admission: EM | Admit: 2013-07-17 | Discharge: 2013-07-17 | Disposition: A | Discharge status: Home / Self Care | Payer: Self-pay | Attending: Emergency Medicine | Admitting: Emergency Medicine

## 2013-07-17 ENCOUNTER — Encounter (HOSPITAL_COMMUNITY): Payer: Self-pay | Admitting: Emergency Medicine

## 2013-07-17 ENCOUNTER — Emergency Department (HOSPITAL_COMMUNITY): Payer: Self-pay

## 2013-07-17 DIAGNOSIS — Z8679 Personal history of other diseases of the circulatory system: Secondary | ICD-10-CM | POA: Insufficient documentation

## 2013-07-17 DIAGNOSIS — J069 Acute upper respiratory infection, unspecified: Secondary | ICD-10-CM | POA: Insufficient documentation

## 2013-07-17 DIAGNOSIS — Z8639 Personal history of other endocrine, nutritional and metabolic disease: Secondary | ICD-10-CM | POA: Insufficient documentation

## 2013-07-17 DIAGNOSIS — H9209 Otalgia, unspecified ear: Secondary | ICD-10-CM | POA: Insufficient documentation

## 2013-07-17 DIAGNOSIS — F411 Generalized anxiety disorder: Secondary | ICD-10-CM | POA: Insufficient documentation

## 2013-07-17 DIAGNOSIS — R197 Diarrhea, unspecified: Secondary | ICD-10-CM | POA: Insufficient documentation

## 2013-07-17 DIAGNOSIS — F329 Major depressive disorder, single episode, unspecified: Secondary | ICD-10-CM | POA: Insufficient documentation

## 2013-07-17 DIAGNOSIS — F3289 Other specified depressive episodes: Secondary | ICD-10-CM | POA: Insufficient documentation

## 2013-07-17 DIAGNOSIS — Z79899 Other long term (current) drug therapy: Secondary | ICD-10-CM | POA: Insufficient documentation

## 2013-07-17 DIAGNOSIS — IMO0001 Reserved for inherently not codable concepts without codable children: Secondary | ICD-10-CM | POA: Insufficient documentation

## 2013-07-17 DIAGNOSIS — Z8742 Personal history of other diseases of the female genital tract: Secondary | ICD-10-CM | POA: Insufficient documentation

## 2013-07-17 DIAGNOSIS — Z862 Personal history of diseases of the blood and blood-forming organs and certain disorders involving the immune mechanism: Secondary | ICD-10-CM | POA: Insufficient documentation

## 2013-07-17 MED ORDER — SALINE SPRAY 0.65 % NA SOLN: 1.0000 | NASAL | Status: DC | PRN

## 2013-07-17 MED ORDER — HYDROCODONE-ACETAMINOPHEN 7.5-325 MG/15ML PO SOLN: 15.0000 mL | Freq: Three times a day (TID) | ORAL | Status: DC | PRN

## 2013-07-17 NOTE — ED Provider Notes (Signed)
CSN: [id number]     Arrival date & time 07/17/13  1404 History   First MD Initiated Contact with Patient 07/17/13 1508    This chart was scribed for Junius Finner PA-C, a non-physician practitioner working with Junius Argyle, MD by Lewanda Rife, ED Scribe. This patient was seen in room TR07C/TR07C and the patient's care was started at 3:30 PM     Chief Complaint  Patient presents with  . Cough   (Consider location/radiation/quality/duration/timing/severity/associated sxs/prior Treatment) The history is provided by the patient. A language interpreter was used.   HPI Comments: ZO LOUDON is a 53 y.o. female who presents to the Emergency Department complaining of intermittent unchanged non-productive cough onset 10 days. Reports associated generalized myalgias, diarrhea, baseline headaches, and bilateral otalgia. Reports trying cough syrup and ibuprofen with no relief of symptoms. Denies any aggravating factors. Denies associated fever, nausea, sore throat, and emesis. Denies smoking cigarettes. Denies hx of asthma.  Past Medical History  Diagnosis Date  . Depression   . Anxiety   . Migraine   . Hypercholesteremia   . Endometriosis    Past Surgical History  Procedure Laterality Date  . Appendectomy    . Abdominal surgery    . Tonsillectomy    . Splenectomy     No family history on file. History  Substance Use Topics  . Smoking status: Never Smoker   . Smokeless tobacco: Not on file  . Alcohol Use: No   OB History   Grav Para Term Preterm Abortions TAB SAB Ect Mult Living                 Review of Systems  Constitutional: Negative for fever.  Respiratory: Positive for cough.   Psychiatric/Behavioral: Negative for confusion.    Allergies  Gabapentin and Iodine  Home Medications   Current Outpatient Rx  Name  Route  Sig  Dispense  Refill  . omeprazole (PRILOSEC) 20 MG capsule   Oral   Take 40 mg by mouth daily.         . sertraline (ZOLOFT)  100 MG tablet   Oral   Take 100 mg by mouth daily.         Marland Kitchen HYDROcodone-acetaminophen (HYCET) 7.5-325 mg/15 ml solution   Oral   Take 15 mLs by mouth every 8 (eight) hours as needed for moderate pain.   120 mL   0   . sodium chloride (OCEAN) 0.65 % SOLN nasal spray   Each Nare   Place 1 spray into both nostrils as needed for congestion.   1 Bottle   0    BP 125/61  Pulse 67  Temp(Src) 97.3 F (36.3 C) (Oral)  Resp 22  Ht 5\' 2"  (1.575 m)  Wt 230 lb 3 oz (104.412 kg)  BMI 42.09 kg/m2  SpO2 95% Physical Exam  Nursing note and vitals reviewed. Constitutional: She is oriented to person, place, and time. She appears well-developed and well-nourished.  Pt sitting in exam chair, intermittent dry cough.  HENT:  Head: Normocephalic and atraumatic.  Right Ear: Hearing, external ear and ear canal normal. Tympanic membrane is bulging. Tympanic membrane is not erythematous.  Left Ear: Hearing, external ear and ear canal normal. Tympanic membrane is bulging. Tympanic membrane is not erythematous.  Nose: Nose normal.  Mouth/Throat: Uvula is midline, oropharynx is clear and moist and mucous membranes are normal. No oropharyngeal exudate, posterior oropharyngeal edema, posterior oropharyngeal erythema or tonsillar abscesses.  Eyes: EOM are normal.  Neck:  Normal range of motion. Neck supple.  Cardiovascular: Normal rate, regular rhythm and normal heart sounds.   Pulmonary/Chest: Effort normal and breath sounds normal. No respiratory distress. She has no wheezes. She has no rales. She exhibits no tenderness.  Abdominal: Soft. Bowel sounds are normal. She exhibits no distension. There is no tenderness.  Musculoskeletal: Normal range of motion.  Neurological: She is alert and oriented to person, place, and time.  Skin: Skin is warm and dry.  Psychiatric: She has a normal mood and affect. Her behavior is normal.    ED Course  Procedures  COORDINATION OF CARE:  Nursing notes reviewed.  Vital signs reviewed. Initial pt interview and examination performed.   Discussed treatment plan with pt at bedside. Pt agrees with plan.   Treatment plan initiated:Medications - No data to display   Initial diagnostic testing ordered.    Labs Review Labs Reviewed - No data to display Imaging Review Dg Chest 2 View  07/17/2013   CLINICAL DATA:  Cough and congestion, shortness of breath for 10 days  EXAM: CHEST  2 VIEW  FINDINGS: The heart size and mediastinal contours are within normal limits. Both lungs are clear. The visualized skeletal structures are unremarkable.  IMPRESSION: No active cardiopulmonary disease.   Electronically Signed   By: Christiana PellantGretchen  Green M.D.   On: 07/17/2013 16:40    EKG Interpretation   None       MDM   1. URI, acute    Pt presenting with URI type symptoms.  CXR: unremarkable. Rx: hycet and ocean nasal spray. Advised to use acetaminophen (when not taking cough syrup) and ibuprofen as needed for fever and pain. Encouraged rest and fluids. Return precautions provided. Pt verbalized understanding and agreement with tx plan.   I personally performed the services described in this documentation, which was scribed in my presence. The recorded information has been reviewed and is accurate.    Junius Finnerrin O'Malley, PA-C 07/17/13 1701

## 2013-07-17 NOTE — Discharge Instructions (Signed)
Cool Mist Vaporizers °Vaporizers may help relieve the symptoms of a cough and cold. They add moisture to the air, which helps mucus to become thinner and less sticky. This makes it easier to breathe and cough up secretions. Cool mist vaporizers do not cause serious burns like hot mist vaporizers ("steamers, humidifiers"). Vaporizers have not been proved to show they help with colds. You should not use a vaporizer if you are allergic to mold.  °HOME CARE INSTRUCTIONS °· Follow the package instructions for the vaporizer. °· Do not use anything other than distilled water in the vaporizer. °· Do not run the vaporizer all of the time. This can cause mold or bacteria to grow in the vaporizer. °· Clean the vaporizer after each time it is used. °· Clean and dry the vaporizer well before storing it. °· Stop using the vaporizer if worsening respiratory symptoms develop. °Document Released: 03/07/2004 Document Revised: 02/10/2013 Document Reviewed: 10/28/2012 °[employer]® Patient Information ©2014 [employer]. ° °Cough, Adult ° A cough is a reflex. It helps you clear your throat and airways. A cough can help heal your body. A cough can last 2 or 3 weeks (acute) or may last more than 8 weeks (chronic). Some common causes of a cough can include an infection, allergy, or a cold. °HOME CARE °· Only take medicine as told by your doctor. °· If given, take your medicines (antibiotics) as told. Finish them even if you start to feel better. °· Use a cold steam vaporizer or humidier in your home. This can help loosen thick spit (secretions). °· Sleep so you are almost sitting up (semi-upright). Use pillows to do this. This helps reduce coughing. °· Rest as needed. °· Stop smoking if you smoke. °GET HELP RIGHT AWAY IF: °· You have yellowish-white fluid (pus) in your thick spit. °· Your cough gets worse. °· Your medicine does not reduce coughing, and you are losing sleep. °· You cough up blood. °· You have trouble breathing. °· Your pain  gets worse and medicine does not help. °· You have a fever. °MAKE SURE YOU:  °· Understand these instructions. °· Will watch your condition. °· Will get help right away if you are not doing well or get worse. °Document Released: 02/21/2011 Document Revised: 09/02/2011 Document Reviewed: 02/21/2011 °[employer]® Patient Information ©2014 [employer]. ° °

## 2013-07-17 NOTE — ED Notes (Signed)
Pt states she has had a non-productive cough x 1 week.  Pt states she feels like there is something to cough up, but it won't come up.

## 2013-07-18 NOTE — ED Provider Notes (Signed)
Medical screening examination/treatment/procedure(s) were performed by non-physician practitioner and as supervising physician I was immediately available for consultation/collaboration.  EKG Interpretation   None         Junius ArgyleForrest S Joy Reiger, MD 07/18/13 1149

## 2013-08-05 ENCOUNTER — Encounter (HOSPITAL_COMMUNITY): Payer: Self-pay | Admitting: Emergency Medicine

## 2013-08-05 ENCOUNTER — Emergency Department (INDEPENDENT_AMBULATORY_CARE_PROVIDER_SITE_OTHER)
Admission: EM | Admit: 2013-08-05 | Discharge: 2013-08-05 | Disposition: A | Discharge status: Home / Self Care | Payer: Self-pay | Source: Home / Self Care

## 2013-08-05 DIAGNOSIS — M5380 Other specified dorsopathies, site unspecified: Secondary | ICD-10-CM

## 2013-08-05 DIAGNOSIS — J069 Acute upper respiratory infection, unspecified: Secondary | ICD-10-CM

## 2013-08-05 DIAGNOSIS — M62830 Muscle spasm of back: Secondary | ICD-10-CM

## 2013-08-05 DIAGNOSIS — M549 Dorsalgia, unspecified: Secondary | ICD-10-CM

## 2013-08-05 DIAGNOSIS — B9789 Other viral agents as the cause of diseases classified elsewhere: Principal | ICD-10-CM

## 2013-08-05 MED ORDER — CYCLOBENZAPRINE HCL 5 MG PO TABS: 5.0000 mg | ORAL_TABLET | Freq: Three times a day (TID) | ORAL | Status: DC | PRN

## 2013-08-05 MED ORDER — MELOXICAM 7.5 MG PO TABS: 7.5000 mg | ORAL_TABLET | Freq: Every day | ORAL | Status: DC

## 2013-08-05 MED ORDER — BENZONATATE 100 MG PO CAPS: 100.0000 mg | ORAL_CAPSULE | Freq: Three times a day (TID) | ORAL | Status: DC | PRN

## 2013-08-05 MED ORDER — IPRATROPIUM BROMIDE 0.06 % NA SOLN: 2.0000 | Freq: Four times a day (QID) | NASAL | Status: DC

## 2013-08-05 NOTE — ED Provider Notes (Signed)
CSN: [id number]     Arrival date & time 08/05/13  1842 History   None    Chief Complaint  Patient presents with  . Back Pain     (Consider location/radiation/quality/duration/timing/severity/associated sxs/prior Treatment) HPI  Back pain: new issue for pt. Started 5 days ago. gettign worse. Located in mid back. Sharp. Non radiating. No change in daily activity or exercise routine. Pt has tried tylenol 1300mg  w/o benefit, heating pad w/o benefit.   Copugh: worse at night. Associated w/ sniffles. Has not tried anything. Present for 2 wks but unchanged. No fevers.     Past Medical History  Diagnosis Date  . Depression   . Anxiety   . Migraine   . Hypercholesteremia   . Endometriosis    Past Surgical History  Procedure Laterality Date  . Appendectomy    . Abdominal surgery    . Tonsillectomy    . Splenectomy     History reviewed. No pertinent family history. History  Substance Use Topics  . Smoking status: Never Smoker   . Smokeless tobacco: Not on file  . Alcohol Use: No   OB History   Grav Para Term Preterm Abortions TAB SAB Ect Mult Living                 Review of Systems  Constitutional: Negative for fever.  Respiratory: Positive for cough. Negative for wheezing and stridor.   Musculoskeletal: Positive for back pain.  All other systems reviewed and are negative.      Allergies  Gabapentin and Iodine  Home Medications   Current Outpatient Rx  Name  Route  Sig  Dispense  Refill  . benzonatate (TESSALON PERLES) 100 MG capsule   Oral   Take 1 capsule (100 mg total) by mouth 3 (three) times daily as needed for cough.   20 capsule   0   . cyclobenzaprine (FLEXERIL) 5 MG tablet   Oral   Take 1 tablet (5 mg total) by mouth 3 (three) times daily as needed for muscle spasms.   30 tablet   0   . HYDROcodone-acetaminophen (HYCET) 7.5-325 mg/15 ml solution   Oral   Take 15 mLs by mouth every 8 (eight) hours as needed for moderate pain.   120 mL   0    . ipratropium (ATROVENT) 0.06 % nasal spray   Each Nare   Place 2 sprays into both nostrils 4 (four) times daily.   15 mL   12   . meloxicam (MOBIC) 7.5 MG tablet   Oral   Take 1-2 tablets (7.5-15 mg total) by mouth daily.   30 tablet   0   . omeprazole (PRILOSEC) 20 MG capsule   Oral   Take 40 mg by mouth daily.         . sertraline (ZOLOFT) 100 MG tablet   Oral   Take 100 mg by mouth daily.         . sodium chloride (OCEAN) 0.65 % SOLN nasal spray   Each Nare   Place 1 spray into both nostrils as needed for congestion.   1 Bottle   0    BP 122/82  Pulse 85  Temp(Src) 98 F (36.7 C) (Oral)  Resp 20  SpO2 94% Physical Exam  Constitutional: She is oriented to person, place, and time. She appears well-developed and well-nourished. No distress.  HENT:  Head: Normocephalic and atraumatic.  Pharyngeal cobblestoning. No frontal or maxillary tenderness  Eyes: EOM are normal. Pupils are  equal, round, and reactive to light.  Neck: Normal range of motion.  Cardiovascular: Normal rate, regular rhythm and intact distal pulses.  Exam reveals no gallop.   No murmur heard. Pulmonary/Chest: Effort normal and breath sounds normal. No respiratory distress.  Abdominal: Soft. She exhibits no distension.  Musculoskeletal:  Perispinal thoracic muscle tenderness to palpation, R>L. No bony abnormality. Excellent respiratory effort. FROM of back.   Neurological: She is alert and oriented to person, place, and time.  Skin: Skin is warm. No rash noted. She is not diaphoretic.  Psychiatric: She has a normal mood and affect. Her behavior is normal. Judgment and thought content normal.    ED Course  Procedures (including critical care time) Labs Review Labs Reviewed - No data to display Imaging Review No results found.    MDM   Final diagnoses:  Viral URI with cough  Back pain  Back spasm    53yo F w/ likely viral uri and postnasal drip causing cough.  - ipatropriam and  tessalon perles  Also w/ muscle spasm  - flexeril and meloxicam  Precautions given and all questions answered   Shelly Flattenavid Merrell, MD Family Medicine PGY-3 08/05/2013, 8:37 PM      Ozella Rocksavid J Merrell, MD 08/05/13 2037

## 2013-08-05 NOTE — ED Provider Notes (Signed)
Medical screening examination/treatment/procedure(s) were performed by a resident physician or non-physician practitioner and as the supervising physician I was immediately available for consultation/collaboration.  Mandee Pluta, MD    Tyneka Scafidi S Zenith Kercheval, MD 08/05/13 2112 

## 2013-08-05 NOTE — Discharge Instructions (Signed)
You likely have a viral cold that is causing post nasal drip and a cough Please start the atrovent to dry up your sinuses and tessalon perles to take the tickle out of your throat. You also have muscle spasms in your back Please start the flexeril to relax your muscles and meloxicam to help with pain and as an antiinflammatory Please follow up with your PCP as needed

## 2013-08-05 NOTE — ED Notes (Signed)
C/o lower lt sided back pain that started 5 days ago. Pain is 9/10. Stated that her sciatic nerve is causing a shooting pain to her rt leg. Other sx include non productive cough, headache, and nausea. Stated she tried warm compresses and Tylenol with no relief. Written by: Marga MelnickQuaNeisha Jones, SMA

## 2013-08-09 ENCOUNTER — Emergency Department (HOSPITAL_COMMUNITY): Payer: Self-pay

## 2013-08-09 ENCOUNTER — Emergency Department (HOSPITAL_COMMUNITY)
Admission: EM | Admit: 2013-08-09 | Discharge: 2013-08-09 | Disposition: A | Discharge status: Home / Self Care | Payer: Self-pay | Attending: Emergency Medicine | Admitting: Emergency Medicine

## 2013-08-09 ENCOUNTER — Encounter (HOSPITAL_COMMUNITY): Payer: Self-pay | Admitting: Emergency Medicine

## 2013-08-09 DIAGNOSIS — R109 Unspecified abdominal pain: Secondary | ICD-10-CM | POA: Insufficient documentation

## 2013-08-09 DIAGNOSIS — Z862 Personal history of diseases of the blood and blood-forming organs and certain disorders involving the immune mechanism: Secondary | ICD-10-CM | POA: Insufficient documentation

## 2013-08-09 DIAGNOSIS — Z8742 Personal history of other diseases of the female genital tract: Secondary | ICD-10-CM | POA: Insufficient documentation

## 2013-08-09 DIAGNOSIS — Z8639 Personal history of other endocrine, nutritional and metabolic disease: Secondary | ICD-10-CM | POA: Insufficient documentation

## 2013-08-09 DIAGNOSIS — M549 Dorsalgia, unspecified: Secondary | ICD-10-CM

## 2013-08-09 DIAGNOSIS — Z8659 Personal history of other mental and behavioral disorders: Secondary | ICD-10-CM | POA: Insufficient documentation

## 2013-08-09 DIAGNOSIS — G43909 Migraine, unspecified, not intractable, without status migrainosus: Secondary | ICD-10-CM | POA: Insufficient documentation

## 2013-08-09 DIAGNOSIS — R079 Chest pain, unspecified: Secondary | ICD-10-CM | POA: Insufficient documentation

## 2013-08-09 DIAGNOSIS — M542 Cervicalgia: Secondary | ICD-10-CM

## 2013-08-09 DIAGNOSIS — Z791 Long term (current) use of non-steroidal anti-inflammatories (NSAID): Secondary | ICD-10-CM | POA: Insufficient documentation

## 2013-08-09 DIAGNOSIS — K1120 Sialoadenitis, unspecified: Secondary | ICD-10-CM | POA: Insufficient documentation

## 2013-08-09 DIAGNOSIS — R599 Enlarged lymph nodes, unspecified: Secondary | ICD-10-CM | POA: Insufficient documentation

## 2013-08-09 LAB — URINALYSIS, ROUTINE W REFLEX MICROSCOPIC
Bilirubin Urine: NEGATIVE
Glucose, UA: NEGATIVE mg/dL
Hgb urine dipstick: NEGATIVE
Ketones, ur: NEGATIVE mg/dL
Leukocytes, UA: NEGATIVE
Nitrite: NEGATIVE
Protein, ur: NEGATIVE mg/dL
Specific Gravity, Urine: 1.028 (ref 1.005–1.030)
Urobilinogen, UA: 0.2 mg/dL (ref 0.0–1.0)
pH: 6 (ref 5.0–8.0)

## 2013-08-09 LAB — BASIC METABOLIC PANEL
BUN: 15 mg/dL (ref 6–23)
Calcium: 9.2 mg/dL (ref 8.4–10.5)
Chloride: 106 mEq/L (ref 96–112)
Creatinine, Ser: 1.01 mg/dL (ref 0.50–1.10)
GFR calc Af Amer: 73 mL/min — ABNORMAL LOW (ref >90)
GFR calc non Af Amer: 63 mL/min — ABNORMAL LOW (ref >90)
Sodium: 142 mEq/L (ref 137–147)

## 2013-08-09 LAB — CBC
HCT: 45.7 % (ref 36.0–46.0)
Hemoglobin: 15.4 g/dL — ABNORMAL HIGH (ref 12.0–15.0)
MCH: 31.2 pg (ref 26.0–34.0)
MCHC: 33.7 g/dL (ref 30.0–36.0)
MCV: 92.5 fL (ref 78.0–100.0)
Platelets: 280 10*3/uL (ref 150–400)
RBC: 4.94 MIL/uL (ref 3.87–5.11)
RDW: 14.5 % (ref 11.5–15.5)
WBC: 9.3 10*3/uL (ref 4.0–10.5)

## 2013-08-09 LAB — HEPATIC FUNCTION PANEL
ALT: 33 U/L (ref 0–35)
AST: 30 U/L (ref 0–37)
Albumin: 3.3 g/dL — ABNORMAL LOW (ref 3.5–5.2)
Alkaline Phosphatase: 46 U/L (ref 39–117)
Bilirubin, Direct: <0.2 mg/dL (ref 0.0–0.3)
Total Bilirubin: 0.3 mg/dL (ref 0.3–1.2)
Total Protein: 7.4 g/dL (ref 6.0–8.3)

## 2013-08-09 LAB — BASIC METABOLIC PANEL WITH GFR
CO2: 27 meq/L (ref 19–32)
Glucose, Bld: 140 mg/dL — ABNORMAL HIGH (ref 70–99)
Potassium: 4.5 meq/L (ref 3.7–5.3)

## 2013-08-09 LAB — POCT I-STAT TROPONIN I: Troponin i, poc: 0 ng/mL (ref 0.00–0.08)

## 2013-08-09 MED ORDER — FAMOTIDINE IN NACL 20-0.9 MG/50ML-% IV SOLN
20.0000 mg | Freq: Once | INTRAVENOUS | Status: AC
  Administered 2013-08-09: 20 mg via INTRAVENOUS
  Filled 2013-08-09: qty 50

## 2013-08-09 MED ORDER — HYDROXYZINE HCL 25 MG PO TABS
50.0000 mg | ORAL_TABLET | Freq: Once | ORAL | Status: AC
  Administered 2013-08-09: 50 mg via ORAL
  Filled 2013-08-09: qty 2

## 2013-08-09 MED ORDER — METHYLPREDNISOLONE SODIUM SUCC 125 MG IJ SOLR
125.0000 mg | Freq: Once | INTRAMUSCULAR | Status: AC
  Administered 2013-08-09: 125 mg via INTRAVENOUS
  Filled 2013-08-09: qty 2

## 2013-08-09 MED ORDER — HYDROXYZINE HCL 50 MG/ML IM SOLN: 50.0000 mg | Freq: Once | INTRAMUSCULAR | Status: DC

## 2013-08-09 MED ORDER — SODIUM CHLORIDE 0.9 % IV SOLN
Freq: Once | INTRAVENOUS | Status: AC
  Administered 2013-08-09: 15:00:00 via INTRAVENOUS

## 2013-08-09 MED ORDER — MORPHINE SULFATE 4 MG/ML IJ SOLN
4.0000 mg | Freq: Once | INTRAMUSCULAR | Status: AC
  Administered 2013-08-09: 4 mg via INTRAVENOUS
  Filled 2013-08-09: qty 1

## 2013-08-09 MED ORDER — CEPHALEXIN 500 MG PO CAPS: ORAL_CAPSULE | ORAL | Status: DC

## 2013-08-09 MED ORDER — DIPHENHYDRAMINE HCL 50 MG/ML IJ SOLN
50.0000 mg | Freq: Once | INTRAMUSCULAR | Status: AC
  Administered 2013-08-09: 50 mg via INTRAVENOUS
  Filled 2013-08-09: qty 1

## 2013-08-09 MED ORDER — IOHEXOL 300 MG/ML  SOLN
75.0000 mL | Freq: Once | INTRAMUSCULAR | Status: AC | PRN
  Administered 2013-08-09: 75 mL via INTRAVENOUS

## 2013-08-09 NOTE — ED Provider Notes (Signed)
After CT scan the patient developed some mild generalized itching with a single urticarial plaque noted on her anterior right ankle a few centimeters in diameter no other rash noted tongue normal limits normal no stridor no drooling voice is normal pulse oximetry is normal on room air 98% lungs are clear and unlabored. She was treated with Benadryl & SoluMedrol prior to her CT scan. 1630  No itching; feels much better; lungs clear tongue normal lips normal; patient aware of need to follow up with ENT.  Monica HornJohn M Sayed Apostol, MD 08/11/13 1910

## 2013-08-09 NOTE — ED Notes (Signed)
Pt reports woke up with right neck gland swollen.  Pt was seen at an urgent care for right posterior back pain and pain hurts with movement and to breath.

## 2013-08-09 NOTE — Discharge Instructions (Signed)
Return sooner to the emergency department if you develop, difficulty breathing, change in speech or swallowing, unable to swallow, shortness of breath, rash, new neck swelling, or other concerns. Have ENT review CT results and follow-up plan.

## 2013-08-09 NOTE — ED Notes (Signed)
Ct called to make aware that pt received medication.

## 2013-08-09 NOTE — ED Provider Notes (Signed)
CSN: [id number]     Arrival date & time 08/09/13  1210 History   First MD Initiated Contact with Patient 08/09/13 1338     Chief Complaint  Patient presents with  . Neck Pain  . Back Pain  . Chest Pain     (Consider location/radiation/quality/duration/timing/severity/associated sxs/prior Treatment) HPI Comments: Pt reports several days ago developed a gradual onset of right side flank pain that she thought might be a kidney infection that she's had in the past.  She went to urgent care, didn't check a urine according to her, given meloxicam and flexeril which hasn't helped too much.  She has had a dry cough for a few days, no fevers, chills.  She denies any CP.  Pain radiates to right upper epigastrium however, not worse with eating.  Not worse with breath, movement.  No dysuria, frequency.  She noticed a firm area and tenderness to right mid neck, no difficulty or pain with swallowing  And no SOB.  She was also given tessalon perles for the cough with slight improvement.  No N/V/D.    Patient is a 53 y.o. female presenting with neck pain, back pain, and chest pain. The history is provided by the patient and medical records.  Neck Pain Associated symptoms: chest pain   Associated symptoms: no headaches and no weakness   Back Pain Associated symptoms: chest pain   Associated symptoms: no abdominal pain, no dysuria, no headaches and no weakness   Chest Pain Associated symptoms: back pain   Associated symptoms: no abdominal pain, no dizziness, no dysphagia, no headache, no nausea, not vomiting and no weakness     Past Medical History  Diagnosis Date  . Depression   . Anxiety   . Migraine   . Hypercholesteremia   . Endometriosis    Past Surgical History  Procedure Laterality Date  . Appendectomy    . Abdominal surgery    . Tonsillectomy    . Splenectomy     History reviewed. No pertinent family history. History  Substance Use Topics  . Smoking status: Never Smoker   .  Smokeless tobacco: Not on file  . Alcohol Use: No   OB History   Grav Para Term Preterm Abortions TAB SAB Ect Mult Living                 Review of Systems  HENT: Negative for congestion, sore throat, trouble swallowing and voice change.   Respiratory: Negative for chest tightness.   Cardiovascular: Positive for chest pain.  Gastrointestinal: Negative for nausea, vomiting, abdominal pain and diarrhea.  Genitourinary: Positive for flank pain. Negative for dysuria and frequency.  Musculoskeletal: Positive for back pain and neck pain.  Neurological: Negative for dizziness, weakness, light-headedness and headaches.  All other systems reviewed and are negative.      Allergies  Gabapentin and Iodine  Home Medications   Current Outpatient Rx  Name  Route  Sig  Dispense  Refill  . acetaminophen (TYLENOL) 500 MG tablet   Oral   Take 1,500 mg by mouth every 6 (six) hours as needed for mild pain.         . benzonatate (TESSALON PERLES) 100 MG capsule   Oral   Take 1 capsule (100 mg total) by mouth 3 (three) times daily as needed for cough.   20 capsule   0   . cyclobenzaprine (FLEXERIL) 5 MG tablet   Oral   Take 1 tablet (5 mg total) by mouth 3 (three)  times daily as needed for muscle spasms.   30 tablet   0   . meloxicam (MOBIC) 7.5 MG tablet   Oral   Take 1-2 tablets (7.5-15 mg total) by mouth daily.   30 tablet   0    BP 113/73  Pulse 78  Temp(Src) 98.3 F (36.8 C) (Oral)  Resp 16  Wt 236 lb 8 oz (107.276 kg)  SpO2 95% Physical Exam  Nursing note and vitals reviewed. Constitutional: She is oriented to person, place, and time. She appears well-developed and well-nourished. No distress.  HENT:  Head: Normocephalic and atraumatic.  Eyes: Conjunctivae and EOM are normal. No scleral icterus.  Neck: Normal range of motion. Neck supple.  Cardiovascular: Normal rate, regular rhythm and intact distal pulses.   Pulmonary/Chest: Effort normal. No respiratory  distress. She has no wheezes.  Abdominal: Soft. She exhibits no distension. There is no tenderness. There is no rebound.  Lymphadenopathy:    She has cervical adenopathy.  Neurological: She is alert and oriented to person, place, and time. No cranial nerve deficit. Coordination normal.  Skin: Skin is warm and dry. No rash noted. She is not diaphoretic.  Psychiatric: She has a normal mood and affect.    ED Course  Procedures (including critical care time) Labs Review Labs Reviewed  CBC - Abnormal; Notable for the following:    Hemoglobin 15.4 (*)    All other components within normal limits  BASIC METABOLIC PANEL - Abnormal; Notable for the following:    Glucose, Bld 140 (*)    GFR calc non Af Amer 63 (*)    GFR calc Af Amer 73 (*)    All other components within normal limits  HEPATIC FUNCTION PANEL - Abnormal; Notable for the following:    Albumin 3.3 (*)    All other components within normal limits  URINALYSIS, ROUTINE W REFLEX MICROSCOPIC - Abnormal; Notable for the following:    APPearance HAZY (*)    All other components within normal limits  POCT I-STAT TROPONIN I   Imaging Review Dg Chest 2 View  08/09/2013   CLINICAL DATA:  Right posterior chest pain, neck pain, back pain, cough  EXAM: CHEST  2 VIEW  COMPARISON:  07/17/2013  FINDINGS: Enlargement of cardiac silhouette.  Slight pulmonary vascular congestion.  Mediastinal contours normal.  Decreased peribronchial thickening versus previous exam.  Lungs clear.  No pleural effusion or pneumothorax.  No acute osseous findings.  IMPRESSION: Enlargement of cardiac silhouette.  No acute abnormalities.   Electronically Signed   By: Ulyses Southward M.D.   On: 08/09/2013 13:34     RA sat is 98% and I interpret to be normal   4:05 PM Pt remains comfortable.  CXR shows no PTX, no infiltrate.  UA is neg for hematuria, no signs of infection.  CT of neck is pending.  Dr. Fonnie Jarvis to follow up on CT results.     MDM   Final diagnoses:   Back pain  Neck pain on right side    Pt with firm, tender mass about 2.5 cm size on right anterior cervical region, moveable.  No difficulty swallowing, no SOB.  Will get CT, will treat with benadryl and solumedrol before hand, although pt had a reaction to contrast many years ago which may have been an older form of contrast.      Gavin Pound. Cornellius Kropp, MD 08/09/13 1606

## 2013-08-09 NOTE — ED Notes (Signed)
Pt in CT and after contrast reports she started to experience itching. Denies any shortness of breath or swelling. CT called this RN to evaluate. Pt 96% on RA; NAD. Radiologist at bedside evaluating as well. Pt returned to room. MD Fonnie JarvisBednar and MD Ghim made aware.

## 2013-12-31 ENCOUNTER — Encounter: Payer: Self-pay | Admitting: Internal Medicine

## 2014-02-18 ENCOUNTER — Encounter: Payer: Self-pay | Admitting: *Deleted

## 2014-02-18 ENCOUNTER — Telehealth: Payer: Self-pay | Admitting: *Deleted

## 2014-02-18 NOTE — Telephone Encounter (Signed)
Patient no show for previsit 02/18/14. Left message to call before 5 pm today to reschedule previsit for 03/02/14 colonoscopy appointment to not be cancelled. If previsit appointment not rescheduled before 5 pm today, colonoscopy scheduled 03/02/14 will be cancelled and both previsit and colonoscopy will need to be rescheduled.

## 2014-03-02 ENCOUNTER — Encounter: Payer: Self-pay | Admitting: Internal Medicine

## 2014-05-23 ENCOUNTER — Other Ambulatory Visit: Payer: Self-pay | Admitting: Internal Medicine

## 2014-05-23 DIAGNOSIS — N6320 Unspecified lump in the left breast, unspecified quadrant: Secondary | ICD-10-CM

## 2014-06-02 ENCOUNTER — Ambulatory Visit
Admission: RE | Admit: 2014-06-02 | Discharge: 2014-06-02 | Disposition: A | Discharge status: Home / Self Care | Payer: Medicare Other | Source: Ambulatory Visit | Attending: Internal Medicine | Admitting: Internal Medicine

## 2014-06-02 DIAGNOSIS — N6320 Unspecified lump in the left breast, unspecified quadrant: Secondary | ICD-10-CM

## 2014-12-19 DIAGNOSIS — M25569 Pain in unspecified knee: Secondary | ICD-10-CM | POA: Diagnosis not present

## 2014-12-19 DIAGNOSIS — Z13220 Encounter for screening for lipoid disorders: Secondary | ICD-10-CM | POA: Diagnosis not present

## 2014-12-19 DIAGNOSIS — R7301 Impaired fasting glucose: Secondary | ICD-10-CM | POA: Diagnosis not present

## 2014-12-19 DIAGNOSIS — E039 Hypothyroidism, unspecified: Secondary | ICD-10-CM | POA: Diagnosis not present

## 2014-12-19 DIAGNOSIS — E669 Obesity, unspecified: Secondary | ICD-10-CM | POA: Diagnosis not present

## 2014-12-19 DIAGNOSIS — M545 Low back pain: Secondary | ICD-10-CM | POA: Diagnosis not present

## 2014-12-28 DIAGNOSIS — M47819 Spondylosis without myelopathy or radiculopathy, site unspecified: Secondary | ICD-10-CM | POA: Diagnosis not present

## 2014-12-28 DIAGNOSIS — M179 Osteoarthritis of knee, unspecified: Secondary | ICD-10-CM | POA: Diagnosis not present

## 2014-12-28 DIAGNOSIS — M519 Unspecified thoracic, thoracolumbar and lumbosacral intervertebral disc disorder: Secondary | ICD-10-CM | POA: Diagnosis not present

## 2015-04-13 IMAGING — MG MM DIAGNOSTIC LTD LEFT
3 series · 3 of 3 positions shown · non-contrast
Comparison: Multiple priors.

CLINICAL DATA: 6-month follow-up mammogram

DIGITAL DIAGNOSTIC LEFT MAMMOGRAM  AND LEFT BREAST ULTRASOUND:

[L CC]
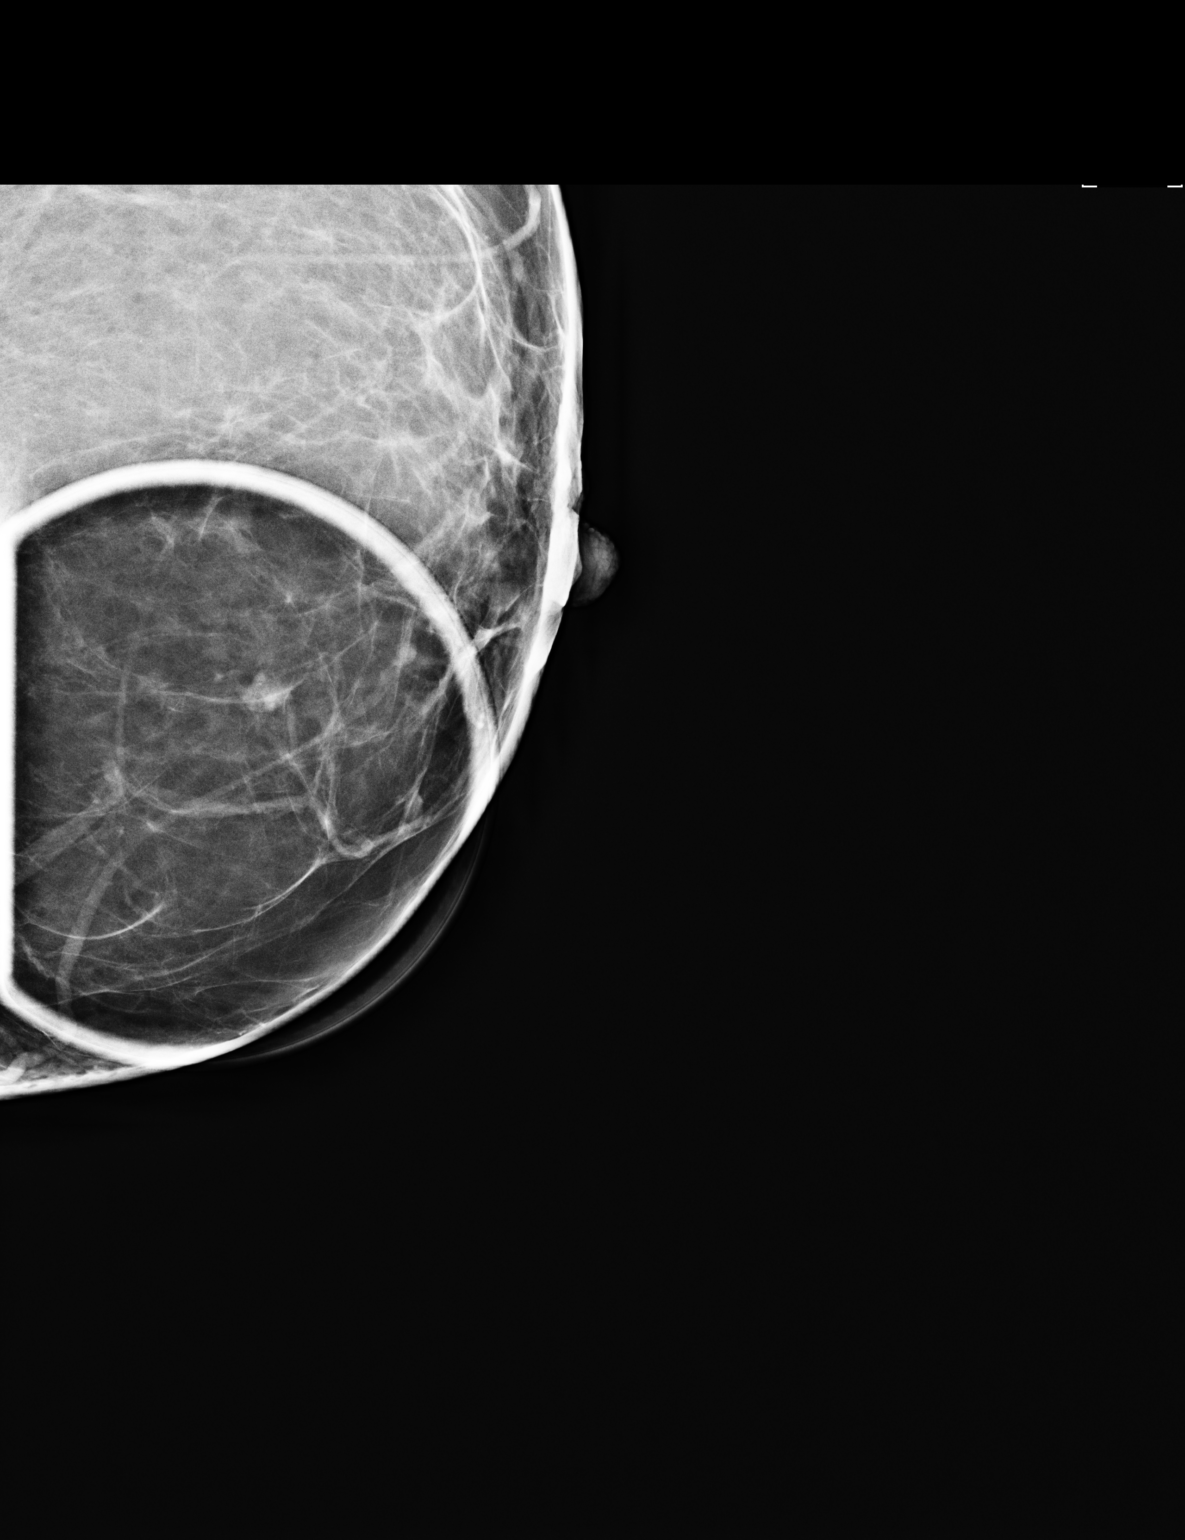

[L MLO (1 of 2)]
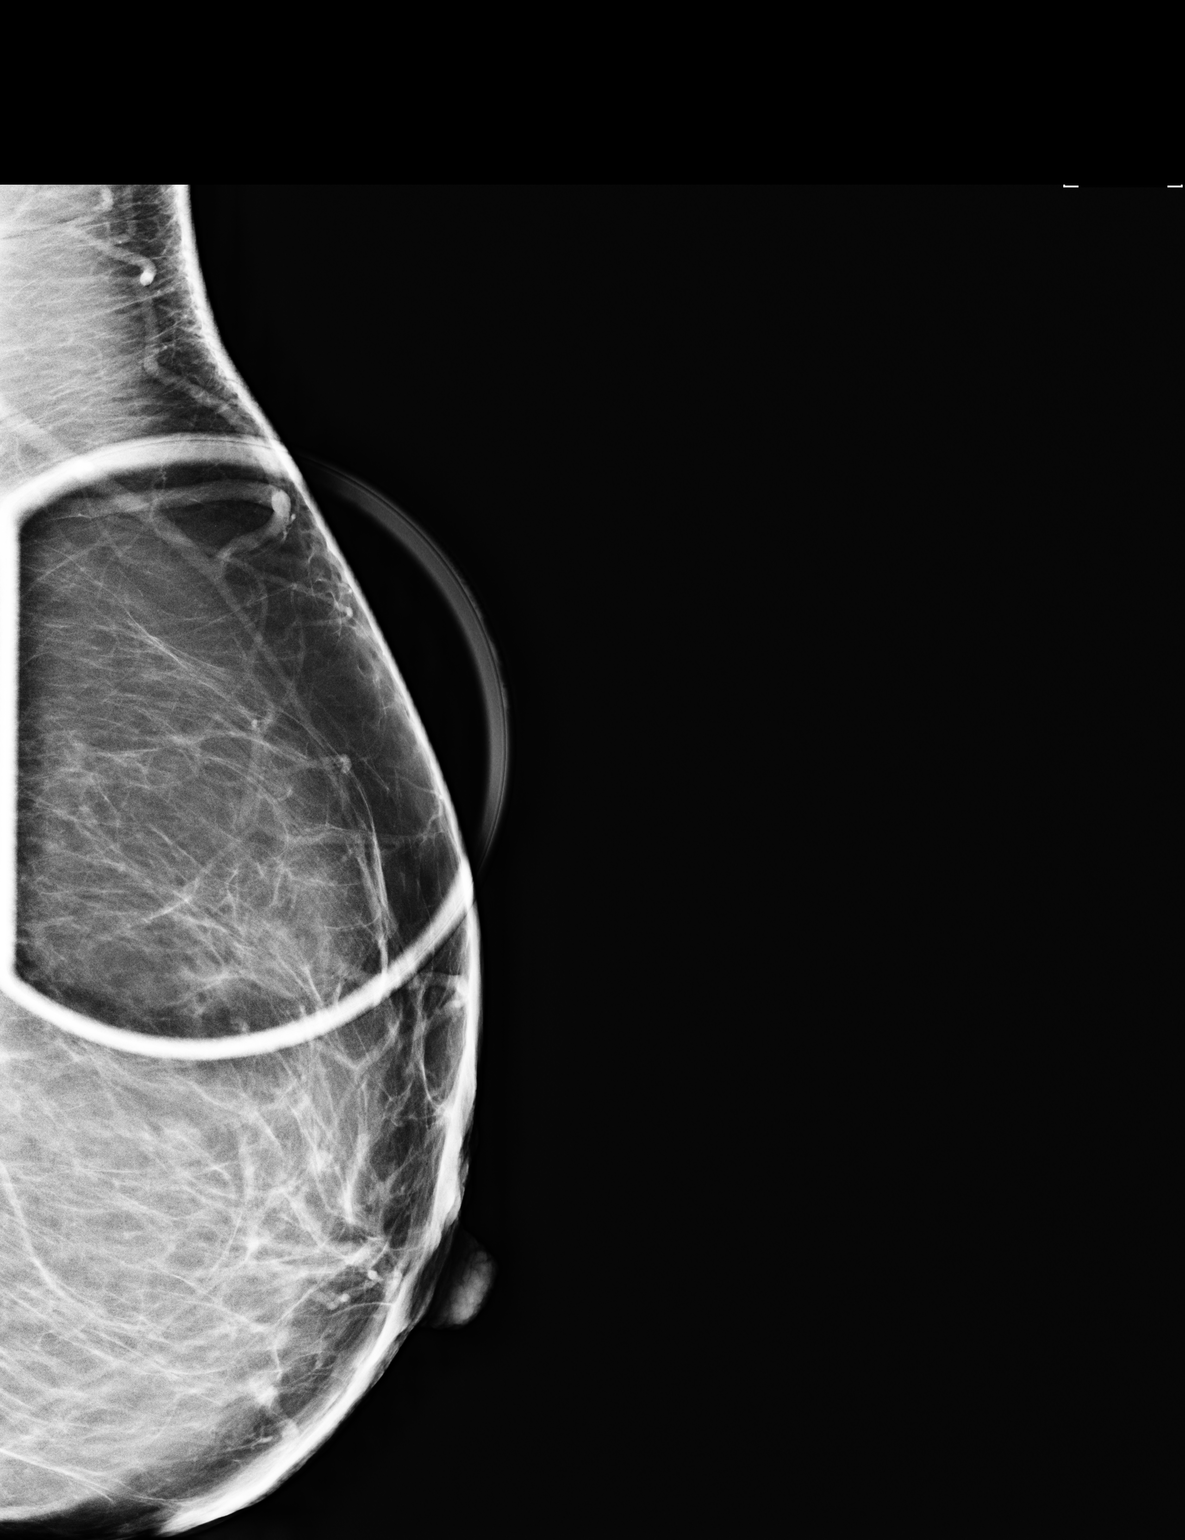

[L MLO (2 of 2)]
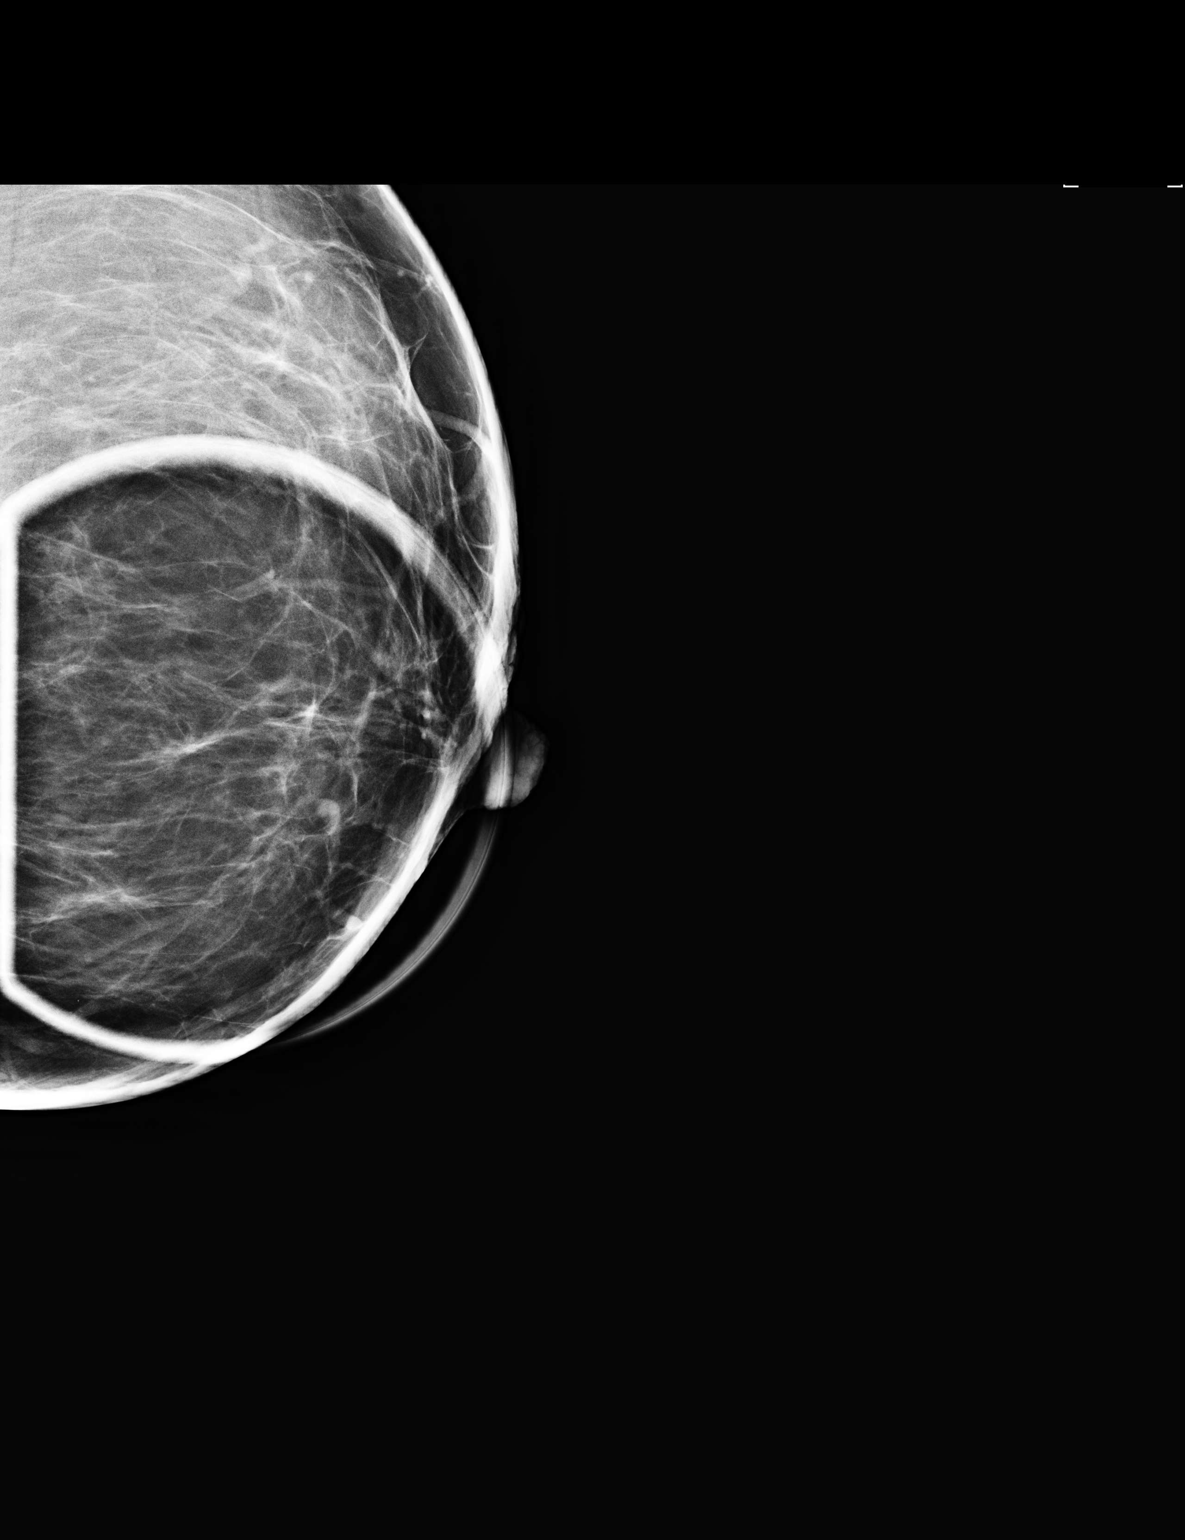

[3 of 3 positions shown; findings below may reference images not displayed]

FINDINGS: ACR Breast Density Category b:  There are scattered areas of
fibroglandular density.

Spot compression CC and MLO views of the left breast were obtained.
There is a circumscribed isodense oval mass in the inner lower left
breast, measuring up to 6 mm.  There is some mammographic
suggestion of a fatty hilum.

On physical exam, no palpable abnormality is identified.

Ultrasound is performed, showing a hypoechoic oval circumscribed
mass, measuring 3 x 4 x 2 mm.  This is located at 8 o'clock, 2 cm
from the nipple. No internal vascularity is demonstrated. This
corresponds with the mammographic abnormality.
IMPRESSION: Probably benign hypoechoic left breast mass, likely representing a
lymph node or a complicated cyst.
BI-RADS CATEGORY 3:  Probably benign finding(s) - short interval
follow-up suggested.

RECOMMENDATION:
 A 6-month follow-up left diagnostic mammogram and ultrasound is
recommended.

I have discussed the findings and recommendations with the patient.
Results were also provided in writing at the conclusion of the
visit.  If applicable, a reminder letter will be sent to the
patient regarding the next appointment.

## 2016-01-14 ENCOUNTER — Emergency Department (HOSPITAL_COMMUNITY)
Admission: EM | Admit: 2016-01-14 | Discharge: 2016-01-14 | Disposition: A | Discharge status: Home / Self Care | Payer: No Typology Code available for payment source | Attending: Emergency Medicine | Admitting: Emergency Medicine

## 2016-01-14 ENCOUNTER — Encounter (HOSPITAL_COMMUNITY): Payer: Self-pay

## 2016-01-14 DIAGNOSIS — M542 Cervicalgia: Secondary | ICD-10-CM | POA: Diagnosis not present

## 2016-01-14 DIAGNOSIS — Y92410 Unspecified street and highway as the place of occurrence of the external cause: Secondary | ICD-10-CM | POA: Insufficient documentation

## 2016-01-14 DIAGNOSIS — M549 Dorsalgia, unspecified: Secondary | ICD-10-CM | POA: Diagnosis present

## 2016-01-14 DIAGNOSIS — Y999 Unspecified external cause status: Secondary | ICD-10-CM | POA: Insufficient documentation

## 2016-01-14 DIAGNOSIS — R51 Headache: Secondary | ICD-10-CM | POA: Insufficient documentation

## 2016-01-14 DIAGNOSIS — Z79899 Other long term (current) drug therapy: Secondary | ICD-10-CM | POA: Insufficient documentation

## 2016-01-14 DIAGNOSIS — R1030 Lower abdominal pain, unspecified: Secondary | ICD-10-CM | POA: Diagnosis not present

## 2016-01-14 DIAGNOSIS — Y939 Activity, unspecified: Secondary | ICD-10-CM | POA: Insufficient documentation

## 2016-01-14 MED ORDER — CYCLOBENZAPRINE HCL 5 MG PO TABS: 5.0000 mg | ORAL_TABLET | Freq: Three times a day (TID) | ORAL | 0 refills | Status: DC | PRN

## 2016-01-14 MED ORDER — IBUPROFEN 800 MG PO TABS: 800.0000 mg | ORAL_TABLET | Freq: Three times a day (TID) | ORAL | 0 refills | Status: DC
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
## 2016-02-09 ENCOUNTER — Ambulatory Visit (INDEPENDENT_AMBULATORY_CARE_PROVIDER_SITE_OTHER): Payer: Medicare Other | Admitting: Internal Medicine

## 2016-02-09 ENCOUNTER — Encounter: Payer: Self-pay | Admitting: Internal Medicine

## 2016-02-09 VITALS — BP 136/82 | HR 77 | Temp 98.4°F | Resp 16 | Ht 62.0 in | Wt 229.0 lb

## 2016-02-09 DIAGNOSIS — J3089 Other allergic rhinitis: Secondary | ICD-10-CM

## 2016-02-09 DIAGNOSIS — F329 Major depressive disorder, single episode, unspecified: Secondary | ICD-10-CM | POA: Insufficient documentation

## 2016-02-09 DIAGNOSIS — M545 Low back pain, unspecified: Secondary | ICD-10-CM | POA: Insufficient documentation

## 2016-02-09 DIAGNOSIS — R5383 Other fatigue: Secondary | ICD-10-CM | POA: Insufficient documentation

## 2016-02-09 DIAGNOSIS — R358 Other polyuria: Secondary | ICD-10-CM

## 2016-02-09 DIAGNOSIS — E282 Polycystic ovarian syndrome: Secondary | ICD-10-CM | POA: Insufficient documentation

## 2016-02-09 DIAGNOSIS — K219 Gastro-esophageal reflux disease without esophagitis: Secondary | ICD-10-CM | POA: Diagnosis not present

## 2016-02-09 DIAGNOSIS — K118 Other diseases of salivary glands: Secondary | ICD-10-CM | POA: Insufficient documentation

## 2016-02-09 DIAGNOSIS — M5441 Lumbago with sciatica, right side: Secondary | ICD-10-CM

## 2016-02-09 DIAGNOSIS — F419 Anxiety disorder, unspecified: Secondary | ICD-10-CM

## 2016-02-09 DIAGNOSIS — M1990 Unspecified osteoarthritis, unspecified site: Secondary | ICD-10-CM | POA: Insufficient documentation

## 2016-02-09 DIAGNOSIS — E66811 Obesity, class 1: Secondary | ICD-10-CM | POA: Insufficient documentation

## 2016-02-09 DIAGNOSIS — J309 Allergic rhinitis, unspecified: Secondary | ICD-10-CM | POA: Insufficient documentation

## 2016-02-09 DIAGNOSIS — R053 Chronic cough: Secondary | ICD-10-CM

## 2016-02-09 DIAGNOSIS — Z862 Personal history of diseases of the blood and blood-forming organs and certain disorders involving the immune mechanism: Secondary | ICD-10-CM

## 2016-02-09 DIAGNOSIS — F32A Depression, unspecified: Secondary | ICD-10-CM

## 2016-02-09 DIAGNOSIS — R5382 Chronic fatigue, unspecified: Secondary | ICD-10-CM

## 2016-02-09 DIAGNOSIS — F418 Other specified anxiety disorders: Secondary | ICD-10-CM

## 2016-02-09 DIAGNOSIS — E669 Obesity, unspecified: Secondary | ICD-10-CM | POA: Insufficient documentation

## 2016-02-09 DIAGNOSIS — R05 Cough: Secondary | ICD-10-CM | POA: Diagnosis not present

## 2016-02-09 DIAGNOSIS — R3589 Other polyuria: Secondary | ICD-10-CM | POA: Insufficient documentation

## 2016-02-09 DIAGNOSIS — R109 Unspecified abdominal pain: Secondary | ICD-10-CM

## 2016-02-09 DIAGNOSIS — Z1159 Encounter for screening for other viral diseases: Secondary | ICD-10-CM

## 2016-02-09 DIAGNOSIS — Z114 Encounter for screening for human immunodeficiency virus [HIV]: Secondary | ICD-10-CM

## 2016-02-09 DIAGNOSIS — K119 Disease of salivary gland, unspecified: Secondary | ICD-10-CM | POA: Insufficient documentation

## 2016-02-09 MED ORDER — MELOXICAM 15 MG PO TABS: 15.0000 mg | ORAL_TABLET | Freq: Every day | ORAL | 1 refills | Status: DC

## 2016-02-09 MED ORDER — TIZANIDINE HCL 4 MG PO TABS: 4.0000 mg | ORAL_TABLET | Freq: Four times a day (QID) | ORAL | 1 refills | Status: DC | PRN

## 2016-02-09 MED ORDER — OMEPRAZOLE 20 MG PO CPDR: 20.0000 mg | DELAYED_RELEASE_CAPSULE | Freq: Every day | ORAL | 1 refills | Status: DC

## 2016-02-09 MED ORDER — SERTRALINE HCL 50 MG PO TABS: 50.0000 mg | ORAL_TABLET | Freq: Every day | ORAL | 1 refills | Status: DC

## 2016-02-09 MED ORDER — AMITRIPTYLINE HCL 25 MG PO TABS: 25.0000 mg | ORAL_TABLET | Freq: Every day | ORAL | 1 refills | Status: DC

## 2016-02-09 MED ORDER — BENZONATATE 100 MG PO CAPS: 100.0000 mg | ORAL_CAPSULE | Freq: Three times a day (TID) | ORAL | 0 refills | Status: DC | PRN

## 2016-02-09 NOTE — Assessment & Plan Note (Signed)
Seen on CT scan from ED in 2015 -  Follow up was advised, but not done due to lack of insurance Will check US ( did not tolerate ct scan dye)

## 2016-02-09 NOTE — Progress Notes (Signed)
Subjective:    Patient ID: Monica PufferKathleen R Welch, female    DOB: April 28, 1961, 55 y.o.   MRN: [id number]  HPI She is here to establish with a new pcp.    GERD:  She is taking her medication daily as prescribed.  She denies any GERD symptoms and feels her GERD is well controlled.   Fatigue:  She has chronic fatigue.  Sometimes she wakes up more tired than when she went to bed.  She is unsure if she snores and does not wake up choking or gasping for air.  She is unsure if she has apnea.    Back pain, knee/hip arthritis:  She is out of her medication and her medication was working well.  She has never had her back pain evaluated.  Recently she has developed numbness on the lateral side of her right foot.    Chronic cough: she needs a refill of her cough medication.  She is unsure why she has a cough.  She feels her GERD is controlled. She denies uncontrolled allergies.  She denies prior evaluation for the cough.    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   Depression, anxiety: She ran out of her medication, but wants a refill.  She denies any side effects from the medication. She feels her depression and anxiety were well controlled and she is happy with her current dose of medication.     Medications and allergies reviewed with patient and updated if appropriate.  Patient Active Problem List   Diagnosis Date Noted  . Chronic cough 02/09/2016  . GERD (gastroesophageal reflux disease) 02/09/2016  . Allergic rhinitis 02/09/2016  . Osteoarthritis 02/09/2016  . Lower back pain 02/09/2016  . Anxiety and depression 02/09/2016  . Obese 02/09/2016  . Polyuria 02/09/2016  . Fatigue 02/09/2016  . 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 PCOS (polycystic ovarian syndrome) 02/09/2016    No current outpatient prescriptions on file prior to visit.   No current facility-administered medications on file prior to visit.     Past Medical History:  Diagnosis Date  . Anxiety   . Depression    . Endometriosis   . Hypercholesteremia   . Migraine     Past Surgical History:  Procedure Laterality Date  . ABDOMINAL SURGERY    . APPENDECTOMY    . SPLENECTOMY    . TONSILLECTOMY      Social History   Social History  . Marital status: Divorced    Spouse name: N/A  . Number of children: N/A  . Years of education: N/A   Social History Main Topics  . Smoking status: Never Smoker  . Smokeless tobacco: Never Used  . Alcohol use No  . Drug use: No     Comment: in the past, pt claimed she used crack  . Sexual activity: Not Asked   Other Topics Concern  . None   Social History Narrative  . None    Family History  Problem Relation Age of Onset  . Hypertension Mother   . Diabetes Mother   . Kidney disease Mother   . Hypertension Father   . Heart disease Father   . Diabetes Sister   . Hypertension Sister   . Kidney disease Sister   . Diabetes Brother     Review of Systems  Constitutional: Positive for fatigue (chronic). Negative for chills and fever.  HENT: Negative for trouble swallowing.        Throat feel  like it closing up at times - occurs randomly  Eyes: Positive for visual disturbance (blurry).  Respiratory: Positive for cough (chronic, cough) and shortness of breath (with stairs only). Negative for wheezing.        Chest wall pain - sternal - constant, tender to touch Non-refreshing sleep, ? Snore, ? apnea  Cardiovascular: Positive for palpitations (occasional) and leg swelling (4 months, left foot). Negative for chest pain.  Gastrointestinal: Positive for abdominal pain (RUQ) and diarrhea. Negative for blood in stool.  Endocrine: Positive for heat intolerance, polydipsia and polyuria.  Genitourinary: Positive for menstrual problem (irregular cycles since 17).  Musculoskeletal: Positive for back pain.  Neurological: Positive for dizziness and headaches. Negative for light-headedness.  Psychiatric/Behavioral: Positive for dysphoric mood. The patient is  nervous/anxious.        Objective:   Vitals:   02/09/16 0911  BP: 136/82  Pulse: 77  Resp: 16  Temp: 98.4 F (36.9 C)   Filed Weights   02/09/16 0911  Weight: 229 lb (103.9 kg)   Body mass index is 41.88 kg/m.   Physical Exam  Constitutional: She is oriented to person, place, and time. She appears well-developed and well-nourished. No distress.  HENT:  Head: Normocephalic and atraumatic.  Right Ear: External ear normal.  Left Ear: External ear normal.  Mouth/Throat: Oropharynx is clear and moist.  Poor dentition  Eyes: Conjunctivae are normal.  Neck: Neck supple. No tracheal deviation present. No thyromegaly present.  Cardiovascular: Normal rate, regular rhythm and normal heart sounds.   Pulmonary/Chest: Effort normal and breath sounds normal. No respiratory distress. She has no wheezes. She has no rales.  Abdominal: Soft. There is tenderness (RUQ and suprapubic region with palpation). There is no rebound and no guarding.  Obese, splenectomy scar  Musculoskeletal: She exhibits edema (trace).  Lymphadenopathy:    She has no cervical adenopathy.  Neurological: She is alert and oriented to person, place, and time.  Skin: Skin is warm and dry. No rash noted. She is not diaphoretic. No erythema.  Psychiatric: She has a normal mood and affect. Her behavior is normal.          Assessment & Plan:   See Problem List for Assessment and Plan of chronic medical problems.

## 2016-02-09 NOTE — Assessment & Plan Note (Signed)
GERD controlled Continue daily medication  

## 2016-02-09 NOTE — Assessment & Plan Note (Addendum)
Chronic back pain with radiculopathy Taking mobic, elavil, tizanidine 4 months of right lateral foot tightness Refer to sports medicine for further evaluation

## 2016-02-09 NOTE — Patient Instructions (Addendum)
Blood work and chest xray next week.  Test(s) ordered today. Your results will be released to MyChart (or called to you) after review, usually within 72hours after test completion. If any changes need to be made, you will be notified at that same time.  All other Health Maintenance issues reviewed.   All recommended immunizations and age-appropriate screenings are up-to-date or discussed.  No immunizations administered today.   Medications reviewed and updated.   No changes recommended at this time.  Your prescription(s) have been submitted to your pharmacy. Please take as directed and contact our office if you believe you are having problem(s) with the medication(s).  A referral was ordered for sports medicine and pulmonary  Please followup in 2 weeks

## 2016-02-09 NOTE — Assessment & Plan Note (Signed)
Taking meloxicam daily - renewed today Check cmp

## 2016-02-09 NOTE — Assessment & Plan Note (Signed)
?   Cause Has not had this evaluated in the past Will refer to pulm for cough and possible OSA Will renew tessalon perles Continue her allergy medication GERD controlled

## 2016-02-09 NOTE — Assessment & Plan Note (Addendum)
Taking sertraline 50 mg daily Controlled on current dose - renewed today

## 2016-02-09 NOTE — Assessment & Plan Note (Signed)
Take clairtin daily

## 2016-02-09 NOTE — Assessment & Plan Note (Signed)
Family history of diabetes Having blurry vision and excessive urination Check a1c

## 2016-02-09 NOTE — Assessment & Plan Note (Signed)
Multiple possible causes  Probable OSA - will refer to pulm for testing Blood work ordered

## 2016-02-09 NOTE — Assessment & Plan Note (Signed)
Very tender on exam in RUQ and suprapubic region to light palpation.  She is obese and I do not think an US will be very helpful, but she needs to have a Ct scan wo constrast  Check labs and urine

## 2016-02-09 NOTE — Progress Notes (Signed)
Pre visit review using our clinic review tool, if applicable. No additional management support is needed unless otherwise documented below in the visit note. 

## 2016-02-16 ENCOUNTER — Ambulatory Visit (INDEPENDENT_AMBULATORY_CARE_PROVIDER_SITE_OTHER): Payer: Medicare Other | Admitting: Pulmonary Disease

## 2016-02-16 ENCOUNTER — Ambulatory Visit (INDEPENDENT_AMBULATORY_CARE_PROVIDER_SITE_OTHER)
Admission: RE | Admit: 2016-02-16 | Discharge: 2016-02-16 | Disposition: A | Discharge status: Home / Self Care | Payer: Medicare Other | Source: Ambulatory Visit | Attending: Internal Medicine | Admitting: Internal Medicine

## 2016-02-16 ENCOUNTER — Other Ambulatory Visit (INDEPENDENT_AMBULATORY_CARE_PROVIDER_SITE_OTHER): Payer: Medicare Other

## 2016-02-16 ENCOUNTER — Encounter: Payer: Self-pay | Admitting: Pulmonary Disease

## 2016-02-16 VITALS — BP 118/70 | HR 66 | Ht 62.0 in | Wt 226.6 lb

## 2016-02-16 DIAGNOSIS — R053 Chronic cough: Secondary | ICD-10-CM

## 2016-02-16 DIAGNOSIS — Z114 Encounter for screening for human immunodeficiency virus [HIV]: Secondary | ICD-10-CM

## 2016-02-16 DIAGNOSIS — R3589 Other polyuria: Secondary | ICD-10-CM

## 2016-02-16 DIAGNOSIS — R0683 Snoring: Secondary | ICD-10-CM | POA: Diagnosis not present

## 2016-02-16 DIAGNOSIS — R358 Other polyuria: Secondary | ICD-10-CM | POA: Diagnosis not present

## 2016-02-16 DIAGNOSIS — Z6841 Body Mass Index (BMI) 40.0 and over, adult: Secondary | ICD-10-CM

## 2016-02-16 DIAGNOSIS — Z1159 Encounter for screening for other viral diseases: Secondary | ICD-10-CM | POA: Diagnosis not present

## 2016-02-16 DIAGNOSIS — R5382 Chronic fatigue, unspecified: Secondary | ICD-10-CM | POA: Diagnosis not present

## 2016-02-16 DIAGNOSIS — R05 Cough: Secondary | ICD-10-CM | POA: Diagnosis not present

## 2016-02-16 LAB — CBC WITH DIFFERENTIAL/PLATELET
BASOS PCT: 0.4 % (ref 0.0–3.0)
Basophils Absolute: 0.1 10*3/uL (ref 0.0–0.1)
EOS ABS: 0.5 10*3/uL (ref 0.0–0.7)
Eosinophils Relative: 3.6 % (ref 0.0–5.0)
HCT: 46.6 % — ABNORMAL HIGH (ref 36.0–46.0)
Hemoglobin: 15.5 g/dL — ABNORMAL HIGH (ref 12.0–15.0)
Lymphocytes Relative: 36.8 % (ref 12.0–46.0)
Lymphs Abs: 4.7 10*3/uL — ABNORMAL HIGH (ref 0.7–4.0)
MCHC: 33.2 g/dL (ref 30.0–36.0)
MCV: 89.4 fl (ref 78.0–100.0)
MONO ABS: 0.7 10*3/uL (ref 0.1–1.0)
Monocytes Relative: 5.6 % (ref 3.0–12.0)
NEUTROS ABS: 6.8 10*3/uL (ref 1.4–7.7)
NEUTROS PCT: 53.6 % (ref 43.0–77.0)
PLATELETS: 273 10*3/uL (ref 150.0–400.0)
RBC: 5.21 Mil/uL — ABNORMAL HIGH (ref 3.87–5.11)
RDW: 14 % (ref 11.5–15.5)
WBC: 12.7 10*3/uL — ABNORMAL HIGH (ref 4.0–10.5)

## 2016-02-16 LAB — COMPREHENSIVE METABOLIC PANEL
ALBUMIN: 3.7 g/dL (ref 3.5–5.2)
ALT: 21 U/L (ref 0–35)
AST: 15 U/L (ref 0–37)
Alkaline Phosphatase: 39 U/L (ref 39–117)
BILIRUBIN TOTAL: 0.5 mg/dL (ref 0.2–1.2)
BUN: 20 mg/dL (ref 6–23)
CHLORIDE: 105 meq/L (ref 96–112)
CO2: 27 meq/L (ref 19–32)
CREATININE: 1.16 mg/dL (ref 0.40–1.20)
Calcium: 8.9 mg/dL (ref 8.4–10.5)
GFR: 51.51 mL/min — ABNORMAL LOW (ref >60.00)
Glucose, Bld: 118 mg/dL — ABNORMAL HIGH (ref 70–99)
Potassium: 3.8 mEq/L (ref 3.5–5.1)
SODIUM: 141 meq/L (ref 135–145)
Total Protein: 7.1 g/dL (ref 6.0–8.3)

## 2016-02-16 LAB — T4, FREE: FREE T4: 0.76 ng/dL (ref 0.60–1.60)

## 2016-02-16 LAB — TSH: TSH: 1.72 u[IU]/mL (ref 0.35–4.50)

## 2016-02-16 LAB — HEMOGLOBIN A1C: Hgb A1c MFr Bld: 6.8 % — ABNORMAL HIGH (ref 4.6–6.5)

## 2016-02-16 NOTE — Progress Notes (Signed)
   Subjective:    Patient ID: Monica Welch, female    DOB: [birth year], 55 y.o.   MRN: [id number]  HPI    Review of Systems  Constitutional: Negative for fever and unexpected weight change.  HENT: Positive for sneezing and sore throat. Negative for congestion, dental problem, ear pain, nosebleeds, postnasal drip, rhinorrhea, sinus pressure and trouble swallowing.   Eyes: Negative for redness and itching.  Respiratory: Positive for cough and shortness of breath. Negative for chest tightness and wheezing.   Cardiovascular: Positive for chest pain. Negative for palpitations and leg swelling.  Gastrointestinal: Positive for abdominal pain. Negative for nausea and vomiting.       Acid Heartburn / Indigestion  Genitourinary: Negative for dysuria.  Musculoskeletal: Positive for arthralgias and joint swelling.  Skin: Negative for rash ( itching).  Neurological: Positive for headaches.  Hematological: Does not bruise/bleed easily.  Psychiatric/Behavioral: Positive for dysphoric mood. The patient is nervous/anxious.        Objective:   Physical Exam        Assessment & Plan:

## 2016-02-16 NOTE — Patient Instructions (Signed)
Will arrange for home sleep study Will call to arrange for follow up after sleep study reviewed  
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
## 2016-02-17 LAB — HIV ANTIBODY (ROUTINE TESTING W REFLEX): HIV 1&2 Ab, 4th Generation: NONREACTIVE

## 2016-02-17 LAB — HEPATITIS C ANTIBODY: HCV AB: NEGATIVE

## 2016-02-23 ENCOUNTER — Encounter: Payer: Self-pay | Admitting: Internal Medicine

## 2016-02-29 ENCOUNTER — Telehealth: Payer: Self-pay | Admitting: Pulmonary Disease

## 2016-02-29 ENCOUNTER — Ambulatory Visit: Payer: Medicare Other | Admitting: Internal Medicine

## 2016-02-29 ENCOUNTER — Encounter: Payer: Self-pay | Admitting: Pulmonary Disease

## 2016-02-29 DIAGNOSIS — E1140 Type 2 diabetes mellitus with diabetic neuropathy, unspecified: Secondary | ICD-10-CM | POA: Insufficient documentation

## 2016-02-29 DIAGNOSIS — G4733 Obstructive sleep apnea (adult) (pediatric): Secondary | ICD-10-CM

## 2016-02-29 DIAGNOSIS — E119 Type 2 diabetes mellitus without complications: Secondary | ICD-10-CM | POA: Insufficient documentation

## 2016-02-29 HISTORY — DX: Obstructive sleep apnea (adult) (pediatric): G47.33

## 2016-02-29 NOTE — Progress Notes (Deleted)
Subjective:    Patient ID: Monica PufferKathleen R Mayse, female    DOB: 1960-10-25, 55 y.o.   MRN: [id number]  HPI The patient is here for follow up.  ? OSA;  She has seen pulmonary and will have a sleep study.  Back pain, knee pain, hip pain, numbness lateral side of right foot:   Chronic cough:    GERD:    Depression, Anxiety: She is taking her medication daily as prescribed. She denies any side effects from the medication. She feels her depression and anxiety are well controlled and she is happy with her current dose of medication.    H/o ITP s/p splenectomy:    Diabetes: She is taking her medication daily as prescribed. She is compliant with a diabetic diet. She is exercising regularly. She monitors her sugars and they have been running XXX. She checks her feet daily and denies foot lesions. She is up-to-date with an ophthalmology examination.   Abdominal pain:    Medications and allergies reviewed with patient and updated if appropriate.  Patient Active Problem List   Diagnosis Date Noted  . Chronic cough 02/09/2016  . GERD (gastroesophageal reflux disease) 02/09/2016  . Allergic rhinitis 02/09/2016  . Osteoarthritis 02/09/2016  . Lower back pain 02/09/2016  . Anxiety and depression 02/09/2016  . Obese 02/09/2016  . Polyuria 02/09/2016  . Fatigue 02/09/2016  . 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 PCOS (polycystic ovarian syndrome) 02/09/2016  . Lesion of parotid gland 02/09/2016  . AP (abdominal pain) 02/09/2016    Current Outpatient Prescriptions on File Prior to Visit  Medication Sig Dispense Refill  . amitriptyline (ELAVIL) 25 MG tablet Take 1 tablet (25 mg total) by mouth at bedtime. 90 tablet 1  . benzonatate (TESSALON PERLES) 100 MG capsule Take 1 capsule (100 mg total) by mouth 3 (three) times daily as needed for cough. 90 capsule 0  . loratadine (CLARITIN) 10 MG tablet Take 10 mg by mouth daily.    . meloxicam (MOBIC) 15 MG tablet Take 1 tablet (15 mg total) by mouth  daily. 90 tablet 1  . omeprazole (PRILOSEC) 20 MG capsule Take 1 capsule (20 mg total) by mouth daily. 90 capsule 1  . sertraline (ZOLOFT) 50 MG tablet Take 1 tablet (50 mg total) by mouth daily. 90 tablet 1  . tiZANidine (ZANAFLEX) 4 MG tablet Take 1 tablet (4 mg total) by mouth every 6 (six) hours as needed for muscle spasms. 901 tablet 1   No current facility-administered medications on file prior to visit.     Past Medical History:  Diagnosis Date  . Anxiety   . Depression   . Endometriosis   . Hypercholesteremia   . Migraine     Past Surgical History:  Procedure Laterality Date  . ABDOMINAL SURGERY    . APPENDECTOMY  2008  . SPLENECTOMY  1978  . TONSILLECTOMY      Social History   Social History  . Marital status: Divorced    Spouse name: N/A  . Number of children: N/A  . Years of education: N/A   Occupational History  . disabled    Social History Main Topics  . Smoking status: Never Smoker  . Smokeless tobacco: Never Used  . Alcohol use No  . Drug use: No     Comment: in the past, pt claimed she used crack  . Sexual activity: Not on file   Other Topics Concern  . Not on file   Social History Narrative  .  No narrative on file    Family History  Problem Relation Age of Onset  . Hypertension Mother   . Diabetes Mother   . Kidney disease Mother   . Hypertension Father   . Heart disease Father   . Diabetes Sister   . Hypertension Sister   . Kidney disease Sister   . Diabetes Brother     Review of Systems     Objective:  There were no vitals filed for this visit. There were no vitals filed for this visit. There is no height or weight on file to calculate BMI.   Physical Exam    Constitutional: Appears well-developed and well-nourished. No distress.  HENT:  Head: Normocephalic and atraumatic.  Neck: Neck supple. No tracheal deviation present. No thyromegaly present.  Cardiovascular: Normal rate, regular rhythm and normal heart sounds.   No  murmur heard. No carotid bruit  Pulmonary/Chest: Effort normal and breath sounds normal. No respiratory distress. No has no wheezes. No rales.  Musculoskeletal: No edema.  Lymphadenopathy: No cervical adenopathy.  Skin: Skin is warm and dry. Not diaphoretic.  Psychiatric: Normal mood and affect. Behavior is normal.     Assessment & Plan:    See Problem List for Assessment and Plan of chronic medical problems.

## 2016-02-29 NOTE — Telephone Encounter (Signed)
HST 02/29/16 >> AHI 8.2, SaO2 low 85%.  Will have my nurse inform pt that sleep study shows mild sleep apnea.  Options are 1) CPAP now, 2) ROV first.  If She is agreeable to CPAP, then please send order for auto CPAP range 5 to 15 cm H2O with heated humidity and mask of choice.  Have download sent 1 month after starting CPAP and set up ROV 2 months after starting CPAP.

## 2016-03-01 ENCOUNTER — Other Ambulatory Visit: Payer: Self-pay | Admitting: *Deleted

## 2016-03-01 DIAGNOSIS — G4733 Obstructive sleep apnea (adult) (pediatric): Secondary | ICD-10-CM | POA: Diagnosis not present

## 2016-03-01 DIAGNOSIS — R0683 Snoring: Secondary | ICD-10-CM

## 2016-03-04 ENCOUNTER — Encounter: Payer: Self-pay | Admitting: Pulmonary Disease

## 2016-03-04 ENCOUNTER — Ambulatory Visit (INDEPENDENT_AMBULATORY_CARE_PROVIDER_SITE_OTHER): Payer: Medicare Other | Admitting: Pulmonary Disease

## 2016-03-04 DIAGNOSIS — R05 Cough: Secondary | ICD-10-CM | POA: Diagnosis not present

## 2016-03-04 DIAGNOSIS — K219 Gastro-esophageal reflux disease without esophagitis: Secondary | ICD-10-CM | POA: Diagnosis not present

## 2016-03-04 DIAGNOSIS — J301 Allergic rhinitis due to pollen: Secondary | ICD-10-CM

## 2016-03-04 DIAGNOSIS — R053 Chronic cough: Secondary | ICD-10-CM

## 2016-03-04 NOTE — Patient Instructions (Signed)
For your allergic rhinitis: Use Neil Med rinses with distilled water at least twice per day using the instructions on the package. 1/2 hour after using the Lloyd HugerNeil Med rinse, use Nasacort (generic) two puffs in each nostril once per day.  Remember that the Nasacort can take 1-2 weeks to work after regular use. Use generic claritin (loratidine) every day.  If this doesn't help, then stop taking it and use chlorpheniramine-phenylephrine combination tablets.   For the acid reflux Follow the lifestyle she tweak a few Take Zantac or Pepcid in the evenings in addition to the Prilosec in the morning  For the cough: You need to try to suppress your cough to allow your larynx (voice box) to heal.  For three days don't talk, laugh, sing, or clear your throat. Do everything you can to suppress the cough during this time. Use hard candies (sugarless Jolly Ranchers) or non-mint or non-menthol containing cough drops during this time to soothe your throat.  Use a cough suppressant (Delsym or what I have prescribed you) around the clock during this time.  After three days, gradually increase the use of your voice and back off on the cough suppressants.  We will see you back in 4-6 weeks if your symptoms have not improved

## 2016-03-04 NOTE — Telephone Encounter (Signed)
Spoke with pt. She is aware of her results. She wants to start with CPAP therapy. Order has been placed. Nothing further was needed.

## 2016-03-04 NOTE — Assessment & Plan Note (Signed)
Persistent symptoms despite taking loratadine.  Plan: Nasal steroid recommended, we discussed briefly the basic pharmacokinetics and appropriate use.

## 2016-03-04 NOTE — Telephone Encounter (Signed)
lmtcb x1 

## 2016-03-04 NOTE — Telephone Encounter (Signed)
[phone] pt calling back

## 2016-03-04 NOTE — Assessment & Plan Note (Signed)
She has persistent cough which is related to acid reflux and postnasal drip from allergic rhinitis. I think there is also a component of "irritable larynx syndrome" or cyclical cough. Essentially this is where the larynx becomes inflamed and leads to recurrent sensation of tickling or itching which is relieved temporarily by cough. Unfortunately cough will perpetuate the symptom.  Fortunately, her lung exam is normal, her oxygenation is normal, and her recent chest x-ray which I have personally reviewed shows no evidence of lung disease.  Plan: Voice rest was encouraged See acid reflux See allergic rhinitis Follow-up in 4-6 weeks if no improvement, consider ENT eval versus PFT if no improvement

## 2016-03-04 NOTE — Progress Notes (Signed)
Subjective:    Patient ID: Monica Welch, female    DOB: [birth year], 55 y.o.   MRN: [id number]  HPI Chief Complaint  Patient presents with  . Advice Only    referred by Dr. Lawerance Bach for chronic nonprod cough, sob X8 mos.      [name] is here for me to evaluate her for cough. > she says it is dry > has lasted for 8 months > is intermittent > is helped by tessalon perles > She doesn't know why she coughs > she feels a tickling in her throat then she needs to cough > water makes it better > no difference with meals  > lying down in bed will make her cough more > no difference with talking > no breathing problems > no mucus production > never been told she has a lung problem > she doesn't remember this starting with a bad cough or cold  She has GERD > prilosec helps a lot  Sometimes she has trouble swallowing: > mostly solid foods, will eventually go down > happens rarely, just a few times per year  Sinus congestion in spring and fall > starting to flare up right now > loratdine helps > never taken the nasal steroids  She is disabled and doesn't work. > she worked in [employer] and worked in a cooler a lot > disabled for depression and anxiety   In the last 8 months no change in living environment.  There is mold, mildew, dust.  The ceiling leaks.  Past Medical History:  Diagnosis Date  . Anxiety   . Depression   . Endometriosis   . Hypercholesteremia   . Migraine   . OSA (obstructive sleep apnea) 02/29/2016     Family History  Problem Relation Age of Onset  . Hypertension Mother   . Diabetes Mother   . Kidney disease Mother   . Hypertension Father   . Heart disease Father   . Diabetes Sister   . Hypertension Sister   . Kidney disease Sister   . Diabetes Brother      Social History   Social History  . Marital status: Divorced    Spouse name: N/A  . Number of children: N/A  . Years of education: N/A   Occupational History  . disabled     Social History Main Topics  . Smoking status: Never Smoker  . Smokeless tobacco: Never Used  . Alcohol use No  . Drug use: No     Comment: in the past, pt claimed she used crack  . Sexual activity: Not on file   Other Topics Concern  . Not on file   Social History Narrative  . No narrative on file     Allergies  Allergen Reactions  . Iohexol Hives    Patient broke out in hives after given the quick prep of Sol-medrol and benadryl  . Gabapentin Itching  . Iodine Hives     Outpatient Medications Prior to Visit  Medication Sig Dispense Refill  . amitriptyline (ELAVIL) 25 MG tablet Take 1 tablet (25 mg total) by mouth at bedtime. 90 tablet 1  . loratadine (CLARITIN) 10 MG tablet Take 10 mg by mouth daily.    . meloxicam (MOBIC) 15 MG tablet Take 1 tablet (15 mg total) by mouth daily. 90 tablet 1  . omeprazole (PRILOSEC) 20 MG capsule Take 1 capsule (20 mg total) by mouth daily. 90 capsule 1  . sertraline (ZOLOFT) 50 MG tablet Take  1 tablet (50 mg total) by mouth daily. 90 tablet 1  . tiZANidine (ZANAFLEX) 4 MG tablet Take 1 tablet (4 mg total) by mouth every 6 (six) hours as needed for muscle spasms. 901 tablet 1  . benzonatate (TESSALON PERLES) 100 MG capsule Take 1 capsule (100 mg total) by mouth 3 (three) times daily as needed for cough. (Patient not taking: Reported on 03/04/2016) 90 capsule 0   No facility-administered medications prior to visit.       Review of Systems  Constitutional: Negative for fever and unexpected weight change.  HENT: Positive for congestion and postnasal drip. Negative for dental problem, ear pain, nosebleeds, rhinorrhea, sinus pressure, sneezing, sore throat and trouble swallowing.   Eyes: Negative for redness and itching.  Respiratory: Positive for cough, chest tightness and shortness of breath. Negative for wheezing.   Cardiovascular: Negative for palpitations and leg swelling.  Gastrointestinal: Negative for nausea and vomiting.   Genitourinary: Negative for dysuria.  Musculoskeletal: Negative for joint swelling.  Skin: Negative for rash.  Neurological: Negative for headaches.  Hematological: Does not bruise/bleed easily.  Psychiatric/Behavioral: Negative for dysphoric mood. The patient is not nervous/anxious.        Objective:   Physical Exam Vitals:   03/04/16 1457  BP: 138/76  Pulse: 82  SpO2: 97%  Weight: 228 lb 12.8 oz (103.8 kg)  Height: 5\' 2"  (1.575 m)   RA  Gen: morbidly obese, well appearing, no acute distress HENT: NCAT, OP clear, neck supple without masses Eyes: PERRL, EOMi Lymph: no cervical lymphadenopathy PULM: CTA B CV: RRR, no mgr, no JVD GI: BS+, soft, nontender, no hsm Derm: no rash or skin breakdown MSK: normal bulk and tone Neuro: A&Ox4, CN II-XII intact, strength 5/5 in all 4 extremities Psyche: normal mood and affect  August 2017 chest x-ray images personally reviewed showing no evidence of an underlying lung disease  CBC    Component Value Date/Time   WBC 12.7 (H) 02/16/2016 1216   RBC 5.21 (H) 02/16/2016 1216   HGB 15.5 (H) 02/16/2016 1216   HCT 46.6 (H) 02/16/2016 1216   PLT 273.0 02/16/2016 1216   MCV 89.4 02/16/2016 1216   MCH 31.2 08/09/2013 1241   MCHC 33.2 02/16/2016 1216   RDW 14.0 02/16/2016 1216   LYMPHSABS 4.7 (H) 02/16/2016 1216   MONOABS 0.7 02/16/2016 1216   EOSABS 0.5 02/16/2016 1216   BASOSABS 0.1 02/16/2016 1216         Assessment & Plan:  Chronic cough She has persistent cough which is related to acid reflux and postnasal drip from allergic rhinitis. I think there is also a component of "irritable larynx syndrome" or cyclical cough. Essentially this is where the larynx becomes inflamed and leads to recurrent sensation of tickling or itching which is relieved temporarily by cough. Unfortunately cough will perpetuate the symptom.  Fortunately, her lung exam is normal, her oxygenation is normal, and her recent chest x-ray which I have  personally reviewed shows no evidence of lung disease.  Plan: Voice rest was encouraged See acid reflux See allergic rhinitis Follow-up in 4-6 weeks if no improvement, consider ENT eval versus PFT if no improvement  GERD (gastroesophageal reflux disease) Still persistent despite taking omeprazole. I think this is contributing to her cough.  Today we reviewed the importance of avoiding certain foods which will perpetuate acid reflux as well as appropriate timing of foods with bedtime. I also recommended that she raise the head of her bed.  I also recommended that  she take a Pepcid in the evening while still taking Prilosec in the morning  Allergic rhinitis Persistent symptoms despite taking loratadine.  Plan: Nasal steroid recommended, we discussed briefly the basic pharmacokinetics and appropriate use.    Current Outpatient Prescriptions:  .  amitriptyline (ELAVIL) 25 MG tablet, Take 1 tablet (25 mg total) by mouth at bedtime., Disp: 90 tablet, Rfl: 1 .  loratadine (CLARITIN) 10 MG tablet, Take 10 mg by mouth daily., Disp: , Rfl:  .  meloxicam (MOBIC) 15 MG tablet, Take 1 tablet (15 mg total) by mouth daily., Disp: 90 tablet, Rfl: 1 .  omeprazole (PRILOSEC) 20 MG capsule, Take 1 capsule (20 mg total) by mouth daily., Disp: 90 capsule, Rfl: 1 .  sertraline (ZOLOFT) 50 MG tablet, Take 1 tablet (50 mg total) by mouth daily., Disp: 90 tablet, Rfl: 1 .  tiZANidine (ZANAFLEX) 4 MG tablet, Take 1 tablet (4 mg total) by mouth every 6 (six) hours as needed for muscle spasms., Disp: 901 tablet, Rfl: 1 .  benzonatate (TESSALON PERLES) 100 MG capsule, Take 1 capsule (100 mg total) by mouth 3 (three) times daily as needed for cough. (Patient not taking: Reported on 03/04/2016), Disp: 90 capsule, Rfl: 0

## 2016-03-04 NOTE — Assessment & Plan Note (Signed)
Still persistent despite taking omeprazole. I think this is contributing to her cough.  Today we reviewed the importance of avoiding certain foods which will perpetuate acid reflux as well as appropriate timing of foods with bedtime. I also recommended that she raise the head of her bed.  I also recommended that she take a Pepcid in the evening while still taking Prilosec in the morning

## 2016-03-05 ENCOUNTER — Ambulatory Visit
Admission: RE | Admit: 2016-03-05 | Discharge: 2016-03-05 | Disposition: A | Discharge status: Home / Self Care | Payer: Medicare Other | Source: Ambulatory Visit | Attending: Internal Medicine | Admitting: Internal Medicine

## 2016-03-05 DIAGNOSIS — R1011 Right upper quadrant pain: Secondary | ICD-10-CM | POA: Diagnosis not present

## 2016-03-05 DIAGNOSIS — R109 Unspecified abdominal pain: Secondary | ICD-10-CM

## 2016-03-05 DIAGNOSIS — K118 Other diseases of salivary glands: Secondary | ICD-10-CM

## 2016-03-05 DIAGNOSIS — K119 Disease of salivary gland, unspecified: Secondary | ICD-10-CM

## 2016-03-07 ENCOUNTER — Encounter: Payer: Self-pay | Admitting: Internal Medicine

## 2016-03-14 ENCOUNTER — Ambulatory Visit (INDEPENDENT_AMBULATORY_CARE_PROVIDER_SITE_OTHER): Payer: Medicare Other | Admitting: Internal Medicine

## 2016-03-14 ENCOUNTER — Encounter: Payer: Self-pay | Admitting: Internal Medicine

## 2016-03-14 VITALS — BP 114/82 | HR 79 | Temp 97.9°F | Resp 16 | Wt 223.0 lb

## 2016-03-14 DIAGNOSIS — E119 Type 2 diabetes mellitus without complications: Secondary | ICD-10-CM | POA: Diagnosis not present

## 2016-03-14 DIAGNOSIS — F329 Major depressive disorder, single episode, unspecified: Secondary | ICD-10-CM

## 2016-03-14 DIAGNOSIS — R109 Unspecified abdominal pain: Secondary | ICD-10-CM

## 2016-03-14 DIAGNOSIS — F418 Other specified anxiety disorders: Secondary | ICD-10-CM

## 2016-03-14 DIAGNOSIS — K219 Gastro-esophageal reflux disease without esophagitis: Secondary | ICD-10-CM

## 2016-03-14 DIAGNOSIS — F32A Depression, unspecified: Secondary | ICD-10-CM

## 2016-03-14 DIAGNOSIS — F419 Anxiety disorder, unspecified: Secondary | ICD-10-CM

## 2016-03-14 DIAGNOSIS — Z23 Encounter for immunization: Secondary | ICD-10-CM | POA: Diagnosis not present

## 2016-03-14 DIAGNOSIS — G4733 Obstructive sleep apnea (adult) (pediatric): Secondary | ICD-10-CM

## 2016-03-14 NOTE — Assessment & Plan Note (Signed)
Will be starting CPAP Encouraged weight loss

## 2016-03-14 NOTE — Assessment & Plan Note (Signed)
Medication increased after seen pulmonary She states her symptoms have improved She has lost some weight since she was here last-encouraged her to continue her weight loss efforts

## 2016-03-14 NOTE — Assessment & Plan Note (Signed)
Controlled, stable Continue current dose of medication  

## 2016-03-14 NOTE — Progress Notes (Signed)
Subjective:    Patient ID: Monica PufferKathleen R Barto, female    DOB: 1960-09-21, 55 y.o.   MRN: [id number]  HPI The patient is here for follow up.  Chronic cough:  She has seen pulmonary.  She is taking her allergy medication and GERD medication daily.  She feels her GERD is controlled.  She has had some allergy symptoms recently.   She thinks the cough is a little better.    Osa, mild:  She had a sleep study and will be starting cpap. She was diagnosed with mild sleep apnea.  Chronic back pain: She continues to have chronic back pain. She does have an appointment to see Dr. Katrinka BlazingSmith for further evaluation.  She is taking her meloxicam and tizanidine as prescribed.  Depression, anxiety: She is taking her medication daily as prescribed. She denies any side effects from the medication. She feels her depression and anxiety are well controlled and she is happy with her current dose of medication.   Diabetes: She was recently diagnosed with diabetes. She has a strong family history of diabetes. We have not started her on any medication.. She is compliant with a diabetic diet. She is not exercising regularly. She has lost weight since she was here last and is unsure how. She checks her feet daily and denies foot lesions-her mother had some of her toes amputated and she has been very conscious of that this. She is not up-to-date with an ophthalmology examination.   Abdominal pain:  The ct scan did not show a reason for the pain.  The pain is in the RUQ and she thinks it is a muscle spasms.  Drinking water seems to hlep.   Medications and allergies reviewed with patient and updated if appropriate.  Patient Active Problem List   Diagnosis Date Noted  . Diabetes (HCC) 02/29/2016  . OSA (obstructive sleep apnea) 02/29/2016  . Chronic cough 02/09/2016  . GERD (gastroesophageal reflux disease) 02/09/2016  . Allergic rhinitis 02/09/2016  . Osteoarthritis 02/09/2016  . Lower back pain 02/09/2016  . Anxiety  and depression 02/09/2016  . Obese 02/09/2016  . Polyuria 02/09/2016  . Fatigue 02/09/2016  . 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 PCOS (polycystic ovarian syndrome) 02/09/2016  . Lesion of parotid gland 02/09/2016  . AP (abdominal pain) 02/09/2016    Current Outpatient Prescriptions on File Prior to Visit  Medication Sig Dispense Refill  . amitriptyline (ELAVIL) 25 MG tablet Take 1 tablet (25 mg total) by mouth at bedtime. 90 tablet 1  . benzonatate (TESSALON PERLES) 100 MG capsule Take 1 capsule (100 mg total) by mouth 3 (three) times daily as needed for cough. 90 capsule 0  . loratadine (CLARITIN) 10 MG tablet Take 10 mg by mouth daily.    . meloxicam (MOBIC) 15 MG tablet Take 1 tablet (15 mg total) by mouth daily. 90 tablet 1  . omeprazole (PRILOSEC) 20 MG capsule Take 1 capsule (20 mg total) by mouth daily. 90 capsule 1  . sertraline (ZOLOFT) 50 MG tablet Take 1 tablet (50 mg total) by mouth daily. 90 tablet 1  . tiZANidine (ZANAFLEX) 4 MG tablet Take 1 tablet (4 mg total) by mouth every 6 (six) hours as needed for muscle spasms. 901 tablet 1   No current facility-administered medications on file prior to visit.     Past Medical History:  Diagnosis Date  . Anxiety   . Depression   . Endometriosis   . Hypercholesteremia   . Migraine   .  OSA (obstructive sleep apnea) 02/29/2016    Past Surgical History:  Procedure Laterality Date  . ABDOMINAL SURGERY    . APPENDECTOMY  2008  . SPLENECTOMY  1978  . TONSILLECTOMY      Social History   Social History  . Marital status: Divorced    Spouse name: N/A  . Number of children: N/A  . Years of education: N/A   Occupational History  . disabled    Social History Main Topics  . Smoking status: Never Smoker  . Smokeless tobacco: Never Used  . Alcohol use No  . Drug use: No     Comment: in the past, pt claimed she used crack  . Sexual activity: Not on file   Other Topics Concern  . Not on file   Social History Narrative  . No  narrative on file    Family History  Problem Relation Age of Onset  . Hypertension Mother   . Diabetes Mother   . Kidney disease Mother   . Hypertension Father   . Heart disease Father   . Diabetes Sister   . Hypertension Sister   . Kidney disease Sister   . Diabetes Brother     Review of Systems  Constitutional: Negative for fever.  Respiratory: Positive for cough and shortness of breath (with stairs). Negative for wheezing.   Cardiovascular: Negative for chest pain, palpitations and leg swelling.  Neurological: Positive for light-headedness and headaches (light induced).       Objective:   Vitals:   03/14/16 1131  BP: 114/82  Pulse: 79  Resp: 16  Temp: 97.9 F (36.6 C)   Filed Weights   03/14/16 1131  Weight: 223 lb (101.2 kg)   Body mass index is 40.79 kg/m.   Physical Exam    Constitutional: Appears well-developed and well-nourished. No distress.  HENT:  Head: Normocephalic and atraumatic.  Neck: Neck supple. No tracheal deviation present. No thyromegaly present.  Cardiovascular: Normal rate, regular rhythm and normal heart sounds.   No murmur heard. No carotid bruit  Pulmonary/Chest: Effort normal and breath sounds normal. No respiratory distress. No has no wheezes. No rales.  Musculoskeletal: No edema.  Lymphadenopathy: No cervical adenopathy.  Skin: Skin is warm and dry. Not diaphoretic.  Psychiatric: Normal mood and affect. Behavior is normal.     Assessment & Plan:    Flu vaccine  See Problem List for Assessment and Plan of chronic medical problems.   F/u in 6 months

## 2016-03-14 NOTE — Progress Notes (Signed)
Pre visit review using our clinic review tool, if applicable. No additional management support is needed unless otherwise documented below in the visit note. 

## 2016-03-14 NOTE — Assessment & Plan Note (Signed)
Lab Results  Component Value Date   HGBA1C 6.8 (H) 02/16/2016   Not currently on any medication-diet controlled She has lost some weight and I encouraged her to continue her weight loss efforts Regular exercise stressed Low sugar/carbohydrate diet Follow-up in 6 months-recheck A1c, urine microalbumin

## 2016-03-14 NOTE — Assessment & Plan Note (Signed)
Improved She feels it is likely related to spasms-improved with increased water intake Intermittent pain in the right upper quadrant CT scan of the abdomen and pelvis was normal She will continue to monitor

## 2016-03-14 NOTE — Patient Instructions (Addendum)
   All other Health Maintenance issues reviewed.   All recommended immunizations and age-appropriate screenings are up-to-date or discussed.  Flu vaccine administered today.   Medications reviewed and updated.  No changes recommended at this time.  Your prescription(s) have been submitted to your pharmacy. Please take as directed and contact our office if you believe you are having problem(s) with the medication(s).   Please followup in 6 months   

## 2016-03-17 NOTE — Progress Notes (Signed)
[facility] [address] [place], [place] [address] Phone: [phone] Subjective:    I'm seeing this patient by the request  of:  Pincus Sanes, MD   CC: Low back pain  [id number]  Monica Welch is a 55 y.o. female coming in with complaint of low back pain. Patient's on primary care provider and was diagnosed with more of a chronic back pain with radiculopathy. Has been taking meloxicam, amitriptyline as well as a muscle relaxer. When reviewing her chart she has had this type of pain since 2009 intermittently.  Patient states that the low back pain seems to be constant now. Has intermittent pain going down the right posterior aspect of the leg. States that it can be severe. Sometimes feels that it spasms. Patient states that the muscle relaxer came be helpful sometimes. Has not seen any other providers for this previously. Rates the severity of pain a 7 out of 10. Feels though that it seems to be worsening.  previous imaging includes a CT abdomen and pelvis taken 03/05/2016. This was independently visualized by me. Patient was found to have degenerative facet arthritic changes and mild-to-moderate degenerative disc disease at L5-S1.  Past Medical History:  Diagnosis Date  . Anxiety   . Depression   . Endometriosis   . Hypercholesteremia   . Migraine   . OSA (obstructive sleep apnea) 02/29/2016   Past Surgical History:  Procedure Laterality Date  . ABDOMINAL SURGERY    . APPENDECTOMY  2008  . SPLENECTOMY  1978  . TONSILLECTOMY     Social History   Social History  . Marital status: Divorced    Spouse name: N/A  . Number of children: N/A  . Years of education: N/A   Occupational History  . disabled    Social History Main Topics  . Smoking status: Never Smoker  . Smokeless tobacco: Never Used  . Alcohol use No  . Drug use: No     Comment: in the past, pt claimed she used crack  . Sexual activity: Not Asked   Other Topics Concern    . None   Social History Narrative  . None   Allergies  Allergen Reactions  . Iohexol Hives    Patient broke out in hives after given the quick prep of Sol-medrol and benadryl  . Gabapentin Itching  . Iodine Hives   Family History  Problem Relation Age of Onset  . Hypertension Mother   . Diabetes Mother   . Kidney disease Mother   . Hypertension Father   . Heart disease Father   . Diabetes Sister   . Hypertension Sister   . Kidney disease Sister   . Diabetes Brother     Past medical history, social, surgical and family history all reviewed in electronic medical record.  No pertanent information unless stated regarding to the chief complaint.   Review of Systems: No headache, visual changes, nausea, vomiting, diarrhea, constipation, dizziness, abdominal pain, skin rash, fevers, chills, night sweats, weight loss, swollen lymph nodes, body aches, joint swelling, muscle aches, chest pain, shortness of breath, mood changes.   Objective  Blood pressure 108/80, pulse 68, weight 225 lb (102.1 kg), SpO2 97 %.  General: No apparent distress alert and oriented x3 mood and affect normal, dressed appropriately. Overweight with poor core strength HEENT: Pupils equal, extraocular movements intact  Respiratory: Patient's speak in full sentences and does not appear short of breath  Cardiovascular: No lower extremity edema, non  tender, no erythema  Skin: Warm dry intact with no signs of infection or rash on extremities or on axial skeleton.  Abdomen: Soft nontender  Neuro: Cranial nerves II through XII are intact, neurovascularly intact in all extremities with 2+ DTRs and 2+ pulses.  Lymph: No lymphadenopathy of posterior or anterior cervical chain or axillae bilaterally.  Gait normal with good balance and coordination.  MSK:  Non tender with full range of motion and good stability and symmetric strength and tone of shoulders, elbows, wrist, hip, knee and ankles bilaterally.  Back Exam:   Inspection: Unremarkable  Motion: Flexion 45 deg, Extension 25 deg, Side Bending to 35 deg bilaterally,  Rotation to 35 deg bilaterally  SLR laying: Positive right side XSLR laying: Negative  Palpable tenderness: Tender to palpation of the paraspinal musculature of the lumbar spine.. No spinous process tenderness FABER: negative. Sensory change: Gross sensation intact to all lumbar and sacral dermatomes.  Reflexes: 2+ at both patellar tendons, 2+ at achilles tendons, Babinski's downgoing.  Strength at foot  Plantar-flexion: 5/5 Dorsi-flexion: 5/5 Eversion: 5/5 Inversion: 5/5  Leg strength  4-5 on the right side compared to 5 out of 5 on the contralateral side.  Procedure note 97110; 15 minutes spent for Therapeutic exercises as stated in above notes.  This included exercises focusing on stretching, strengthening, with significant focus on eccentric aspects.   Low back exercises that included:  Pelvic tilt/bracing instruction to focus on control of the pelvic girdle and lower abdominal muscles  Glute strengthening exercises, focusing on proper firing of the glutes without engaging the low back muscles Proper stretching techniques for maximum relief for the hamstrings, hip flexors, low back and some rotation where tolerated  Proper technique shown and discussed handout in great detail with ATC.  All questions were discussed and answered.     Impression and Recommendations:     This case required medical decision making of moderate complexity.      Note: This dictation was prepared with Dragon dictation along with smaller phrase technology. Any transcriptional errors that result from this process are unintentional.

## 2016-03-18 ENCOUNTER — Ambulatory Visit (INDEPENDENT_AMBULATORY_CARE_PROVIDER_SITE_OTHER): Payer: Medicare Other | Admitting: Family Medicine

## 2016-03-18 ENCOUNTER — Encounter: Payer: Self-pay | Admitting: Family Medicine

## 2016-03-18 DIAGNOSIS — M5416 Radiculopathy, lumbar region: Secondary | ICD-10-CM | POA: Insufficient documentation

## 2016-03-18 MED ORDER — VENLAFAXINE HCL ER 37.5 MG PO CP24: 37.5000 mg | ORAL_CAPSULE | Freq: Every day | ORAL | 1 refills | Status: DC

## 2016-03-18 MED ORDER — VITAMIN D (ERGOCALCIFEROL) 1.25 MG (50000 UNIT) PO CAPS: 50000.0000 [IU] | ORAL_CAPSULE | ORAL | 0 refills | Status: DC

## 2016-03-18 NOTE — Assessment & Plan Note (Signed)
Patient work with Event organiserathletic trainer to learn home exercises in greater detail. We discussed icing regimen and home exercises. We discussed which activities to do in which ones to avoid. We discussed the importance of core strength in proper shoes. Patient given medication, Effexor that could help with the low dose. Do not increase likelihood secondary to her other comorbidities. We'll talk at follow-up in 4-6 weeks.

## 2016-03-18 NOTE — Patient Instructions (Addendum)
Good to see you.  Ice 20 minutes 2 times daily. Usually after activity and before bed. Exercises 3 times a week.  Once weekly vitamin d for next 12 weeks.  Effexor 37.5 mg daily  Stop the amitriptyline.   Over the counter-- turmeric 500mg  daily  OK to take muscle relaxer if you need it.  See me again in 4 weeks.

## 2016-04-12 ENCOUNTER — Encounter: Payer: Self-pay | Admitting: Internal Medicine

## 2016-04-14 NOTE — Progress Notes (Signed)
[facility] [address] [place], [place] [address] Phone: [phone] Subjective:     CC: Low back pain f/u  [id number]  Monica Welch is a 55 y.o. female coming in with complaint of low back pain. Patient's on primary care provider and was diagnosed with more of a chronic back pain with radiculopathy.   Patient was having more radicular symptoms seem to going down the leg. More in the posterior aspect. Seemed to be L5-S1 distribution. Patient was given conservative therapy including home exercises, icing protocol, which activities to do in which ones to avoid. Patient states Doing significantly better. States that she feels 80% better. Feels like the medicines amended a difference. Not having as much radiation down the leg. Patient states that she is able to do more activity and is sleeping more comfortable he.    previous imaging includes a CT abdomen and pelvis taken 03/05/2016. This was independently visualized by me. Patient was found to have degenerative facet arthritic changes and mild-to-moderate degenerative disc disease at L5-S1.  Past Medical History:  Diagnosis Date  . Anxiety   . Depression   . Endometriosis   . Hypercholesteremia   . Migraine   . OSA (obstructive sleep apnea) 02/29/2016   Past Surgical History:  Procedure Laterality Date  . ABDOMINAL SURGERY    . APPENDECTOMY  2008  . SPLENECTOMY  1978  . TONSILLECTOMY     Social History   Social History  . Marital status: Divorced    Spouse name: N/A  . Number of children: N/A  . Years of education: N/A   Occupational History  . disabled    Social History Main Topics  . Smoking status: Never Smoker  . Smokeless tobacco: Never Used  . Alcohol use No  . Drug use: No     Comment: in the past, pt claimed she used crack  . Sexual activity: Not Asked   Other Topics Concern  . None   Social History Narrative  . None   Allergies  Allergen Reactions  . Iohexol  Hives    Patient broke out in hives after given the quick prep of Sol-medrol and benadryl  . Gabapentin Itching  . Iodine Hives   Family History  Problem Relation Age of Onset  . Hypertension Mother   . Diabetes Mother   . Kidney disease Mother   . Hypertension Father   . Heart disease Father   . Diabetes Sister   . Hypertension Sister   . Kidney disease Sister   . Diabetes Brother     Past medical history, social, surgical and family history all reviewed in electronic medical record.  No pertanent information unless stated regarding to the chief complaint.   Review of Systems: No headache, visual changes, nausea, vomiting, diarrhea, constipation, dizziness, abdominal pain, skin rash, fevers, chills, night sweats, weight loss, swollen lymph nodes, body aches, joint swelling, muscle aches, chest pain, shortness of breath, mood changes.   Objective  Blood pressure 108/80, pulse 83, weight 225 lb (102.1 kg), SpO2 94 %.  General: No apparent distress alert and oriented x3 mood and affect normal, dressed appropriately. Overweight with poor core strength HEENT: Pupils equal, extraocular movements intact  Respiratory: Patient's speak in full sentences and does not appear short of breath  Cardiovascular: No lower extremity edema, non tender, no erythema  Skin: Warm dry intact with no signs of infection or rash on extremities or on axial skeleton.  Abdomen: Soft nontender  Neuro: Cranial nerves II through XII are intact, neurovascularly intact in all extremities with 2+ DTRs and 2+ pulses.  Lymph: No lymphadenopathy of posterior or anterior cervical chain or axillae bilaterally.  Gait normal with good balance and coordination.  MSK:  Non tender with full range of motion and good stability and symmetric strength and tone of shoulders, elbows, wrist, hip, knee and ankles bilaterally.  Back Exam:  Inspection: Unremarkable  Motion: Flexion 45 deg, Extension 25 deg, Side Bending to 35 deg  bilaterally,  Rotation to 35 deg bilaterally  SLR laying: Positive right side XSLR laying: Negative  Palpable tenderness: Nontender on exam today FABER: negative. Sensory change: Gross sensation intact to all lumbar and sacral dermatomes.  Reflexes: 2+ at both patellar tendons, 2+ at achilles tendons, Babinski's downgoing.  Strength at foot  Plantar-flexion: 5/5 Dorsi-flexion: 5/5 Eversion: 5/5 Inversion: 5/5  Leg strength  4-5 on the right side compared to 5 out of 5 on the contralateral side.     Impression and Recommendations:     This case required medical decision making of moderate complexity.      Note: This dictation was prepared with Dragon dictation along with smaller phrase technology. Any transcriptional errors that result from this process are unintentional.

## 2016-04-15 ENCOUNTER — Ambulatory Visit (INDEPENDENT_AMBULATORY_CARE_PROVIDER_SITE_OTHER): Payer: Medicare Other | Admitting: Family Medicine

## 2016-04-15 ENCOUNTER — Encounter: Payer: Self-pay | Admitting: Family Medicine

## 2016-04-15 DIAGNOSIS — M5416 Radiculopathy, lumbar region: Secondary | ICD-10-CM

## 2016-04-15 NOTE — Patient Instructions (Signed)
Good to see you  You are doing great overall.  Ice is your friend No changes in medicine See me again in 6-8 weeks.

## 2016-04-15 NOTE — Assessment & Plan Note (Signed)
Doing much better at this time. No radicular symptom. We discussed icing regimen and home exercises. We discussed which activities to do an which was to avoid. Patient will continue to stay active. No change in medications. Follow-up again in 6 weeks.

## 2016-04-16 ENCOUNTER — Ambulatory Visit: Payer: Medicare Other | Admitting: Pulmonary Disease

## 2016-06-08 NOTE — Progress Notes (Deleted)
Monica ScaleZach Monica Welch D.O. [facility] [address] [place], [place] [address] Phone: [phone] Subjective:     CC: Low back pain f/u  ONG:[id number]:Subjective  Monica PufferKathleen R Welch is a 55 y.o. female coming in with complaint of low back pain  Patient was having more radicular symptoms seem to going down the leg. More in the posterior aspect. Seemed to be L5-S1 distribution. Patient at last follow-up was doing significantly better. Patient was making progress and starting to increase activity. Patient was to continue on the low dose of Effexor as well as once weekly vitamin D. Patient states    previous imaging includes a CT abdomen and pelvis taken 03/05/2016. This was independently visualized by me. Patient was found to have degenerative facet arthritic changes and mild-to-moderate degenerative disc disease at L5-S1.  Past Medical History:  Diagnosis Date  . Anxiety   . Depression   . Endometriosis   . Hypercholesteremia   . Migraine   . OSA (obstructive sleep apnea) 02/29/2016   Past Surgical History:  Procedure Laterality Date  . ABDOMINAL SURGERY    . APPENDECTOMY  2008  . SPLENECTOMY  1978  . TONSILLECTOMY     Social History   Social History  . Marital status: Divorced    Spouse name: N/A  . Number of children: N/A  . Years of education: N/A   Occupational History  . disabled    Social History Main Topics  . Smoking status: Never Smoker  . Smokeless tobacco: Never Used  . Alcohol use No  . Drug use: No     Comment: in the past, pt claimed she used crack  . Sexual activity: Not on file   Other Topics Concern  . Not on file   Social History Narrative  . No narrative on file   Allergies  Allergen Reactions  . Iohexol Hives    Patient broke out in hives after given the quick prep of Sol-medrol and benadryl  . Gabapentin Itching  . Iodine Hives   Family History  Problem Relation Age of Onset  . Hypertension Mother   . Diabetes Mother   . Kidney disease  Mother   . Hypertension Father   . Heart disease Father   . Diabetes Sister   . Hypertension Sister   . Kidney disease Sister   . Diabetes Brother     Past medical history, social, surgical and family history all reviewed in electronic medical record.  No pertanent information unless stated regarding to the chief complaint.   Review of Systems: No headache, visual changes, nausea, vomiting, diarrhea, constipation, dizziness, abdominal pain, skin rash, fevers, chills, night sweats, weight loss, swollen lymph nodes, body aches, joint swelling, muscle aches, chest pain, shortness of breath, mood changes.    Objective  There were no vitals taken for this visit.  Systems examined below as of 06/08/16 General: NAD A&O x3 mood, affect normal  HEENT: Pupils equal, extraocular movements intact no nystagmus Respiratory: not short of breath at rest or with speaking Cardiovascular: No lower extremity edema, non tender Skin: Warm dry intact with no signs of infection or rash on extremities or on axial skeleton. Abdomen: Soft nontender, no masses Neuro: Cranial nerves  intact, neurovascularly intact in all extremities with 2+ DTRs and 2+ pulses. Lymph: No lymphadenopathy appreciated today  Gait normal with good balance and coordination.  MSK: Non tender with full range of motion and good stability and symmetric strength and tone of shoulders, elbows, wrist,  knee  hips and ankles bilaterally.   Back Exam:  Inspection: Unremarkable  Motion: Flexion 45 deg, Extension 25 deg, Side Bending to 35 deg bilaterally,  Rotation to 35 deg bilaterally  SLR laying: Positive right side XSLR laying: Negative  Palpable tenderness: Nontender on exam today FABER: negative. Sensory change: Gross sensation intact to all lumbar and sacral dermatomes.  Reflexes: 2+ at both patellar tendons, 2+ at achilles tendons, Babinski's downgoing.  Strength at foot  Plantar-flexion: 5/5 Dorsi-flexion: 5/5 Eversion: 5/5  Inversion: 5/5  Leg strength  4-5 on the right side compared to 5 out of 5 on the contralateral side.     Impression and Recommendations:     This case required medical decision making of moderate complexity.      Note: This dictation was prepared with Dragon dictation along with smaller phrase technology. Any transcriptional errors that result from this process are unintentional.

## 2016-06-10 ENCOUNTER — Ambulatory Visit: Payer: Medicare Other | Admitting: Family Medicine

## 2016-06-11 ENCOUNTER — Other Ambulatory Visit: Payer: Self-pay | Admitting: Internal Medicine

## 2016-07-17 ENCOUNTER — Other Ambulatory Visit: Payer: Self-pay | Admitting: *Deleted

## 2016-07-17 MED ORDER — TIZANIDINE HCL 4 MG PO TABS: 4.0000 mg | ORAL_TABLET | Freq: Four times a day (QID) | ORAL | 1 refills | Status: DC | PRN

## 2016-07-19 MED ORDER — AMITRIPTYLINE HCL 25 MG PO TABS: 25.0000 mg | ORAL_TABLET | Freq: Every day | ORAL | 1 refills | Status: DC

## 2016-07-19 MED ORDER — SERTRALINE HCL 50 MG PO TABS: 50.0000 mg | ORAL_TABLET | Freq: Every day | ORAL | 1 refills | Status: DC

## 2016-07-19 MED ORDER — MELOXICAM 15 MG PO TABS: 15.0000 mg | ORAL_TABLET | Freq: Every day | ORAL | 1 refills | Status: DC

## 2016-07-19 NOTE — Addendum Note (Signed)
Addended by: Deatra JamesBRAND, Corneilus Heggie M on: 07/19/2016 11:51 AM   Modules accepted: Orders

## 2016-07-22 ENCOUNTER — Telehealth: Payer: Self-pay | Admitting: *Deleted

## 2016-07-22 NOTE — Telephone Encounter (Signed)
Rec'd call pt requesting 90 days scripts to be sent to [facility] on her Venlafaxine & Vitamin D.../lmb

## 2016-07-23 MED ORDER — VENLAFAXINE HCL ER 37.5 MG PO CP24: 37.5000 mg | ORAL_CAPSULE | Freq: Every day | ORAL | 1 refills | Status: DC

## 2016-07-23 MED ORDER — VITAMIN D (ERGOCALCIFEROL) 1.25 MG (50000 UNIT) PO CAPS: 50000.0000 [IU] | ORAL_CAPSULE | ORAL | 0 refills | Status: DC

## 2016-07-23 NOTE — Telephone Encounter (Signed)
Refill sent into [facility].

## 2016-07-23 NOTE — Telephone Encounter (Signed)
Forwarding msg to lindsay since meds was rx Dr. Katrinka BlazingSmith...Raechel Chute/lmb

## 2016-07-29 ENCOUNTER — Encounter: Payer: Self-pay | Admitting: Internal Medicine

## 2016-07-29 ENCOUNTER — Ambulatory Visit (INDEPENDENT_AMBULATORY_CARE_PROVIDER_SITE_OTHER): Payer: Medicare Other | Admitting: Internal Medicine

## 2016-07-29 ENCOUNTER — Other Ambulatory Visit (INDEPENDENT_AMBULATORY_CARE_PROVIDER_SITE_OTHER): Payer: Medicare Other

## 2016-07-29 VITALS — BP 130/76 | HR 70 | Temp 98.3°F | Resp 16 | Wt 230.0 lb

## 2016-07-29 DIAGNOSIS — F329 Major depressive disorder, single episode, unspecified: Secondary | ICD-10-CM

## 2016-07-29 DIAGNOSIS — F418 Other specified anxiety disorders: Secondary | ICD-10-CM

## 2016-07-29 DIAGNOSIS — E119 Type 2 diabetes mellitus without complications: Secondary | ICD-10-CM

## 2016-07-29 DIAGNOSIS — Z23 Encounter for immunization: Secondary | ICD-10-CM

## 2016-07-29 DIAGNOSIS — K219 Gastro-esophageal reflux disease without esophagitis: Secondary | ICD-10-CM

## 2016-07-29 DIAGNOSIS — F419 Anxiety disorder, unspecified: Secondary | ICD-10-CM

## 2016-07-29 DIAGNOSIS — F32A Depression, unspecified: Secondary | ICD-10-CM

## 2016-07-29 LAB — COMPREHENSIVE METABOLIC PANEL
ALBUMIN: 3.7 g/dL (ref 3.5–5.2)
ALT: 19 U/L (ref 0–35)
AST: 14 U/L (ref 0–37)
Alkaline Phosphatase: 38 U/L — ABNORMAL LOW (ref 39–117)
BUN: 17 mg/dL (ref 6–23)
CHLORIDE: 109 meq/L (ref 96–112)
CO2: 28 mEq/L (ref 19–32)
CREATININE: 1.06 mg/dL (ref 0.40–1.20)
Calcium: 9.1 mg/dL (ref 8.4–10.5)
GFR: 57.07 mL/min — ABNORMAL LOW (ref >60.00)
Glucose, Bld: 112 mg/dL — ABNORMAL HIGH (ref 70–99)
Potassium: 4.4 mEq/L (ref 3.5–5.1)
Sodium: 141 mEq/L (ref 135–145)
Total Bilirubin: 0.6 mg/dL (ref 0.2–1.2)
Total Protein: 7.1 g/dL (ref 6.0–8.3)

## 2016-07-29 LAB — MICROALBUMIN / CREATININE URINE RATIO
Creatinine,U: 150.3 mg/dL
Microalb Creat Ratio: 0.5 mg/g (ref 0.0–30.0)
Microalb, Ur: 0.7 mg/dL (ref 0.0–1.9)

## 2016-07-29 LAB — HEMOGLOBIN A1C: HEMOGLOBIN A1C: 6.8 % — AB (ref 4.6–6.5)

## 2016-07-29 MED ORDER — BLOOD GLUCOSE MONITOR KIT: PACK | 0 refills | Status: DC

## 2016-07-29 NOTE — Assessment & Plan Note (Signed)
Currently diet controlled Stressed low sugar/carbohydrate diet and regular exercise. Stressed weight loss She plans on joining the gym If her kidney function is good and she agrees to start metformin, which will help ensure her sugars are controlled and maintained in her weight loss efforts. May also help with some PCO S symptoms (facial hair)

## 2016-07-29 NOTE — Assessment & Plan Note (Signed)
GERD controlled Continue daily medication  

## 2016-07-29 NOTE — Progress Notes (Signed)
Subjective:    Patient ID: Monica Welch, female    DOB: Mar 28, 1961, 56 y.o.   MRN: [id number]  HPI She is here for follow up.  Vein in right hand:  For the past 6 weeks a vein on her right hand will get large and become tender.  She is unsure why it occurs.  It occurs intermittently for no apparent reason. She currently does not have any symptoms.  Diabetes: She is controlling her sugars with lifestyle. She is compliant with a diabetic diet. She is not exercising regularly.  She checks her feet daily and denies foot lesions. She is up-to-date with an ophthalmology examination.  She has been on metformin in the past and did well with it.  GERD:  She is taking her medication daily as prescribed.  She denies any GERD symptoms and feels her GERD is well controlled.    Depression, anxiety: She is taking her medications daily as prescribed. She feels her anxiety and depression are well controlled and she is happy with her current medication.  Medications and allergies reviewed with patient and updated if appropriate.  Patient Active Problem List   Diagnosis Date Noted  . Right lumbar radiculopathy 03/18/2016  . Diabetes ([facility]) 02/29/2016  . OSA (obstructive sleep apnea) 02/29/2016  . Chronic cough 02/09/2016  . GERD (gastroesophageal reflux disease) 02/09/2016  . Allergic rhinitis 02/09/2016  . Osteoarthritis 02/09/2016  . Lower back pain 02/09/2016  . Anxiety and depression 02/09/2016  . Obese 02/09/2016  . Fatigue 02/09/2016  . 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 PCOS (polycystic ovarian syndrome) 02/09/2016  . Lesion of parotid gland 02/09/2016  . AP (abdominal pain) 02/09/2016    Current Outpatient Prescriptions on File Prior to Visit  Medication Sig Dispense Refill  . amitriptyline (ELAVIL) 25 MG tablet Take 1 tablet (25 mg total) by mouth at bedtime. 90 tablet 1  . loratadine (CLARITIN) 10 MG tablet Take 10 mg by mouth daily.    . meloxicam (MOBIC) 15 MG tablet Take 1  tablet (15 mg total) by mouth daily. 90 tablet 1  . omeprazole (PRILOSEC) 20 MG capsule Take 1 capsule (20 mg total) by mouth daily. 90 capsule 1  . sertraline (ZOLOFT) 50 MG tablet Take 1 tablet (50 mg total) by mouth daily. 90 tablet 1  . venlafaxine XR (EFFEXOR XR) 37.5 MG 24 hr capsule Take 1 capsule (37.5 mg total) by mouth daily with breakfast. 90 capsule 1  . Vitamin D, Ergocalciferol, (DRISDOL) 50000 units CAPS capsule Take 1 capsule (50,000 Units total) by mouth every 7 (seven) days. 12 capsule 0   No current facility-administered medications on file prior to visit.     Past Medical History:  Diagnosis Date  . Anxiety   . Depression   . Endometriosis   . Hypercholesteremia   . Migraine   . OSA (obstructive sleep apnea) 02/29/2016    Past Surgical History:  Procedure Laterality Date  . ABDOMINAL SURGERY    . APPENDECTOMY  2008  . SPLENECTOMY  1978  . TONSILLECTOMY      Social History   Social History  . Marital status: Divorced    Spouse name: N/A  . Number of children: N/A  . Years of education: N/A   Occupational History  . disabled    Social History Main Topics  . Smoking status: Never Smoker  . Smokeless tobacco: Never Used  . Alcohol use No  . Drug use: No     Comment: in  the past, pt claimed she used crack  . Sexual activity: Not Asked   Other Topics Concern  . None   Social History Narrative  . None    Family History  Problem Relation Age of Onset  . Hypertension Mother   . Diabetes Mother   . Kidney disease Mother   . Hypertension Father   . Heart disease Father   . Diabetes Sister   . Hypertension Sister   . Kidney disease Sister   . Diabetes Brother     Review of Systems  Constitutional: Negative for fever.  Respiratory: Positive for cough. Negative for shortness of breath and wheezing.   Cardiovascular: Negative for chest pain and leg swelling.  Neurological: Positive for light-headedness and numbness (one toe - chronic). Negative  for headaches.       Objective:   Vitals:   07/29/16 1335  BP: 130/76  Pulse: 70  Resp: 16  Temp: 98.3 F (36.8 C)   Filed Weights   07/29/16 1335  Weight: 230 lb (104.3 kg)   Body mass index is 42.07 kg/m.  Wt Readings from Last 3 Encounters:  07/29/16 230 lb (104.3 kg)  04/15/16 225 lb (102.1 kg)  03/18/16 225 lb (102.1 kg)     Physical Exam Constitutional: Appears well-developed and well-nourished. No distress.  HENT:  Head: Normocephalic and atraumatic.  Neck: Neck supple. No tracheal deviation present. No thyromegaly present.  No cervical lymphadenopathy Cardiovascular: Normal rate, regular rhythm and normal heart sounds.   No murmur heard. No carotid bruit .  No edema. Blood vessels in both hands normal-no tenderness or enlargement. No evidence of superficial thrombophlebitis. Pulmonary/Chest: Effort normal and breath sounds normal. No respiratory distress. No has no wheezes. No rales.  Skin: Skin is warm and dry. Not diaphoretic.  Psychiatric: Normal mood and affect. Behavior is normal.       Assessment & Plan:   See Problem List for Assessment and Plan of chronic medical problems.

## 2016-07-29 NOTE — Patient Instructions (Addendum)
  Test(s) ordered today. Your results will be released to MyChart (or called to you) after review, usually within 72hours after test completion. If any changes need to be made, you will be notified at that same time.  All other Health Maintenance issues reviewed.   All recommended immunizations and age-appropriate screenings are up-to-date or discussed.  pneumonia immunization administered today.   Medications reviewed and updated.  No changes recommended at this time.  Your prescription(s) have been submitted to your pharmacy. Please take as directed and contact our office if you believe you are having problem(s) with the medication(s).    Please followup in 6 months

## 2016-07-29 NOTE — Assessment & Plan Note (Signed)
Controlled, stable Continue current dose of medication  

## 2016-07-29 NOTE — Progress Notes (Signed)
Pre visit review using our clinic review tool, if applicable. No additional management support is needed unless otherwise documented below in the visit note. 

## 2016-09-09 ENCOUNTER — Ambulatory Visit: Payer: Medicare Other | Admitting: Internal Medicine

## 2016-10-28 LAB — HM DIABETES EYE EXAM

## 2016-11-06 ENCOUNTER — Other Ambulatory Visit: Payer: Self-pay | Admitting: Internal Medicine

## 2016-11-08 ENCOUNTER — Encounter: Payer: Self-pay | Admitting: Internal Medicine

## 2016-11-19 ENCOUNTER — Encounter: Payer: Self-pay | Admitting: Internal Medicine

## 2016-11-19 NOTE — Progress Notes (Unsigned)
Results entered and sent to scan  
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
## 2017-01-26 NOTE — Patient Instructions (Addendum)
Have blood work and an Personal assistantxray today.  Test(s) ordered today. Your results will be released to MyChart (or called to you) after review, usually within 72hours after test completion. If any changes need to be made, you will be notified at that same time.   Medications reviewed and updated.  Changes include increasing the sertraline to 100 mg.    Your prescription(s) have been submitted to your pharmacy. Please take as directed and contact our office if you believe you are having problem(s) with the medication(s).   Please followup in 1 month

## 2017-01-27 ENCOUNTER — Other Ambulatory Visit (INDEPENDENT_AMBULATORY_CARE_PROVIDER_SITE_OTHER): Payer: Medicare Other

## 2017-01-27 ENCOUNTER — Ambulatory Visit (INDEPENDENT_AMBULATORY_CARE_PROVIDER_SITE_OTHER): Payer: Medicare Other | Admitting: Internal Medicine

## 2017-01-27 ENCOUNTER — Ambulatory Visit (INDEPENDENT_AMBULATORY_CARE_PROVIDER_SITE_OTHER)
Admission: RE | Admit: 2017-01-27 | Discharge: 2017-01-27 | Disposition: A | Discharge status: Home / Self Care | Payer: Medicare Other | Source: Ambulatory Visit | Attending: Internal Medicine | Admitting: Internal Medicine

## 2017-01-27 ENCOUNTER — Encounter: Payer: Self-pay | Admitting: Internal Medicine

## 2017-01-27 VITALS — BP 152/92 | HR 76 | Temp 98.1°F | Resp 16 | Wt 226.0 lb

## 2017-01-27 DIAGNOSIS — F32A Depression, unspecified: Secondary | ICD-10-CM

## 2017-01-27 DIAGNOSIS — F329 Major depressive disorder, single episode, unspecified: Secondary | ICD-10-CM

## 2017-01-27 DIAGNOSIS — S0992XA Unspecified injury of nose, initial encounter: Secondary | ICD-10-CM | POA: Insufficient documentation

## 2017-01-27 DIAGNOSIS — T7411XA Adult physical abuse, confirmed, initial encounter: Secondary | ICD-10-CM

## 2017-01-27 DIAGNOSIS — F419 Anxiety disorder, unspecified: Secondary | ICD-10-CM | POA: Diagnosis not present

## 2017-01-27 DIAGNOSIS — S0993XA Unspecified injury of face, initial encounter: Secondary | ICD-10-CM | POA: Diagnosis not present

## 2017-01-27 DIAGNOSIS — R030 Elevated blood-pressure reading, without diagnosis of hypertension: Secondary | ICD-10-CM | POA: Diagnosis not present

## 2017-01-27 DIAGNOSIS — E119 Type 2 diabetes mellitus without complications: Secondary | ICD-10-CM

## 2017-01-27 DIAGNOSIS — K219 Gastro-esophageal reflux disease without esophagitis: Secondary | ICD-10-CM | POA: Diagnosis not present

## 2017-01-27 LAB — COMPREHENSIVE METABOLIC PANEL
ALK PHOS: 45 U/L (ref 39–117)
ALT: 32 U/L (ref 0–35)
AST: 20 U/L (ref 0–37)
Albumin: 3.9 g/dL (ref 3.5–5.2)
BUN: 11 mg/dL (ref 6–23)
CHLORIDE: 105 meq/L (ref 96–112)
CO2: 30 mEq/L (ref 19–32)
Calcium: 9.5 mg/dL (ref 8.4–10.5)
Creatinine, Ser: 0.99 mg/dL (ref 0.40–1.20)
GFR: 61.64 mL/min (ref >60.00)
GLUCOSE: 97 mg/dL (ref 70–99)
POTASSIUM: 4.7 meq/L (ref 3.5–5.1)
Sodium: 140 mEq/L (ref 135–145)
TOTAL PROTEIN: 7.7 g/dL (ref 6.0–8.3)
Total Bilirubin: 0.9 mg/dL (ref 0.2–1.2)

## 2017-01-27 LAB — MICROALBUMIN / CREATININE URINE RATIO
Creatinine,U: 165.9 mg/dL
MICROALB UR: 2.3 mg/dL — AB (ref 0.0–1.9)
MICROALB/CREAT RATIO: 1.4 mg/g (ref 0.0–30.0)

## 2017-01-27 LAB — HEMOGLOBIN A1C: HEMOGLOBIN A1C: 7.4 % — AB (ref 4.6–6.5)

## 2017-01-27 MED ORDER — LORATADINE 10 MG PO TABS: 10.0000 mg | ORAL_TABLET | Freq: Every day | ORAL | 1 refills | Status: DC

## 2017-01-27 MED ORDER — SERTRALINE HCL 100 MG PO TABS: 100.0000 mg | ORAL_TABLET | Freq: Every day | ORAL | 1 refills | Status: DC

## 2017-01-27 MED ORDER — BLOOD PRESSURE KIT: PACK | 0 refills | Status: DC

## 2017-01-27 NOTE — Assessment & Plan Note (Signed)
Check a1c Low sugar / carb diet Stressed regular exercise   

## 2017-01-27 NOTE — Assessment & Plan Note (Addendum)
BP Readings from Last 3 Encounters:  01/27/17 (!) 152/92  07/29/16 130/76  04/15/16 108/80   Will recheck in one month Continue to monitor BP at home

## 2017-01-27 NOTE — Assessment & Plan Note (Signed)
Not controlled Increase sertraline to 100 mg daily Follow up in one month

## 2017-01-27 NOTE — Assessment & Plan Note (Signed)
GERD controlled Continue daily medication  
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
## 2017-01-30 ENCOUNTER — Encounter: Payer: Self-pay | Admitting: Internal Medicine

## 2017-02-01 MED ORDER — METFORMIN HCL 500 MG PO TABS
500.0000 mg | ORAL_TABLET | Freq: Two times a day (BID) | ORAL | 3 refills | Status: DC
Start: 2017-02-01 — End: 2018-03-02

## 2017-02-27 ENCOUNTER — Ambulatory Visit: Payer: Medicare Other | Admitting: Internal Medicine

## 2017-03-11 ENCOUNTER — Ambulatory Visit: Payer: Medicare Other | Admitting: Internal Medicine

## 2017-05-01 ENCOUNTER — Other Ambulatory Visit: Payer: Self-pay | Admitting: Internal Medicine

## 2017-07-29 ENCOUNTER — Other Ambulatory Visit: Payer: Self-pay | Admitting: Internal Medicine

## 2017-09-25 ENCOUNTER — Ambulatory Visit (INDEPENDENT_AMBULATORY_CARE_PROVIDER_SITE_OTHER): Payer: Medicare Other | Admitting: Internal Medicine

## 2017-09-25 ENCOUNTER — Encounter: Payer: Self-pay | Admitting: Internal Medicine

## 2017-09-25 ENCOUNTER — Other Ambulatory Visit (INDEPENDENT_AMBULATORY_CARE_PROVIDER_SITE_OTHER): Payer: Medicare Other

## 2017-09-25 VITALS — BP 118/80 | HR 83 | Temp 98.3°F | Resp 16 | Wt 220.0 lb

## 2017-09-25 DIAGNOSIS — G4733 Obstructive sleep apnea (adult) (pediatric): Secondary | ICD-10-CM

## 2017-09-25 DIAGNOSIS — G629 Polyneuropathy, unspecified: Secondary | ICD-10-CM | POA: Diagnosis not present

## 2017-09-25 DIAGNOSIS — Z6841 Body Mass Index (BMI) 40.0 and over, adult: Secondary | ICD-10-CM

## 2017-09-25 DIAGNOSIS — E119 Type 2 diabetes mellitus without complications: Secondary | ICD-10-CM | POA: Diagnosis not present

## 2017-09-25 DIAGNOSIS — R5382 Chronic fatigue, unspecified: Secondary | ICD-10-CM

## 2017-09-25 DIAGNOSIS — E1140 Type 2 diabetes mellitus with diabetic neuropathy, unspecified: Secondary | ICD-10-CM | POA: Insufficient documentation

## 2017-09-25 LAB — CBC WITH DIFFERENTIAL/PLATELET
BASOS PCT: 0.6 % (ref 0.0–3.0)
Basophils Absolute: 0.1 10*3/uL (ref 0.0–0.1)
EOS ABS: 0.5 10*3/uL (ref 0.0–0.7)
EOS PCT: 4.5 % (ref 0.0–5.0)
HCT: 49.4 % — ABNORMAL HIGH (ref 36.0–46.0)
Hemoglobin: 16.5 g/dL — ABNORMAL HIGH (ref 12.0–15.0)
LYMPHS ABS: 2.7 10*3/uL (ref 0.7–4.0)
Lymphocytes Relative: 26.6 % (ref 12.0–46.0)
MCHC: 33.3 g/dL (ref 30.0–36.0)
MCV: 91.1 fl (ref 78.0–100.0)
MONO ABS: 0.8 10*3/uL (ref 0.1–1.0)
Monocytes Relative: 7.4 % (ref 3.0–12.0)
NEUTROS ABS: 6.3 10*3/uL (ref 1.4–7.7)
NEUTROS PCT: 60.9 % (ref 43.0–77.0)
PLATELETS: NORMAL 10*3/uL (ref 150.0–400.0)
RBC: 5.43 Mil/uL — ABNORMAL HIGH (ref 3.87–5.11)
RDW: 14 % (ref 11.5–15.5)
WBC: 10.3 10*3/uL (ref 4.0–10.5)

## 2017-09-25 LAB — COMPREHENSIVE METABOLIC PANEL
ALBUMIN: 3.7 g/dL (ref 3.5–5.2)
ALT: 20 U/L (ref 0–35)
AST: 12 U/L (ref 0–37)
Alkaline Phosphatase: 43 U/L (ref 39–117)
BUN: 14 mg/dL (ref 6–23)
CHLORIDE: 105 meq/L (ref 96–112)
CO2: 25 mEq/L (ref 19–32)
CREATININE: 0.97 mg/dL (ref 0.40–1.20)
Calcium: 9.1 mg/dL (ref 8.4–10.5)
GFR: 62.96 mL/min (ref >60.00)
GLUCOSE: 175 mg/dL — AB (ref 70–99)
POTASSIUM: 3.9 meq/L (ref 3.5–5.1)
SODIUM: 138 meq/L (ref 135–145)
Total Bilirubin: 0.5 mg/dL (ref 0.2–1.2)
Total Protein: 7.2 g/dL (ref 6.0–8.3)

## 2017-09-25 LAB — TSH: TSH: 1.1 u[IU]/mL (ref 0.35–4.50)

## 2017-09-25 LAB — HEMOGLOBIN A1C: HEMOGLOBIN A1C: 7.7 % — AB (ref 4.6–6.5)

## 2017-09-25 LAB — VITAMIN B12: VITAMIN B 12: 365 pg/mL (ref 211–911)

## 2017-09-25 MED ORDER — PREGABALIN 25 MG PO CAPS: 25.0000 mg | ORAL_CAPSULE | Freq: Two times a day (BID) | ORAL | 0 refills | Status: DC

## 2017-09-25 NOTE — Assessment & Plan Note (Signed)
Has a history of chronic fatigue She denies depression Check basic labs including CBC, CMP, TSH

## 2017-09-25 NOTE — Assessment & Plan Note (Addendum)
Taking metformin daily Will check A1c Encouraged compliance with diabetic diet and ideally regular exercise She states she is not sure what she should and should not be eating-will refer to nutrition

## 2017-09-25 NOTE — Assessment & Plan Note (Signed)
Never got cpap

## 2017-09-25 NOTE — Patient Instructions (Addendum)
  Test(s) ordered today. Your results will be released to MyChart (or called to you) after review, usually within 72hours after test completion. If any changes need to be made, you will be notified at that same time.   Medications reviewed and updated.  Changes include stopping the amitriptyline. Start lyrica twice daily - if tolerated we can increase the dose.    Your prescription(s) have been submitted to your pharmacy. Please take as directed and contact our office if you believe you are having problem(s) with the medication(s).  A referral was ordered for nutrition  Please followup in 3 months
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
## 2017-09-25 NOTE — Assessment & Plan Note (Signed)
Check B12 level Neuropathy likely related to diabetes Can consider neurology evaluation Will try Lyrica 25 mg twice daily-advised we can increase this if she tolerates it Did not tolerate gabapentin in the past

## 2017-09-25 NOTE — Assessment & Plan Note (Signed)
Encouraged weight loss-decreased portions, healthy diet and regular exercise encouraged

## 2017-09-26 ENCOUNTER — Encounter: Payer: Self-pay | Admitting: Emergency Medicine

## 2017-11-20 ENCOUNTER — Ambulatory Visit (INDEPENDENT_AMBULATORY_CARE_PROVIDER_SITE_OTHER): Payer: Medicare Other | Admitting: *Deleted

## 2017-11-20 ENCOUNTER — Telehealth: Payer: Self-pay | Admitting: *Deleted

## 2017-11-20 VITALS — BP 138/74 | HR 73 | Resp 16 | Ht 62.0 in | Wt 218.0 lb

## 2017-11-20 DIAGNOSIS — E119 Type 2 diabetes mellitus without complications: Secondary | ICD-10-CM

## 2017-11-20 DIAGNOSIS — Z0000 Encounter for general adult medical examination without abnormal findings: Secondary | ICD-10-CM

## 2017-11-20 DIAGNOSIS — Z1231 Encounter for screening mammogram for malignant neoplasm of breast: Secondary | ICD-10-CM

## 2017-11-20 MED ORDER — OMEPRAZOLE 20 MG PO CPDR: 20.0000 mg | DELAYED_RELEASE_CAPSULE | Freq: Every day | ORAL | 1 refills | Status: DC

## 2017-11-20 MED ORDER — LORATADINE 10 MG PO TABS: 10.0000 mg | ORAL_TABLET | Freq: Every day | ORAL | 1 refills | Status: DC

## 2017-11-20 NOTE — Progress Notes (Signed)
Subjective:   Monica Welch is a 57 y.o. female who presents for an Initial Medicare Annual Wellness Visit.  Patient states she is feeling depressed. She denies having thoughts of hurting herself or any suicidal ideations at this time and states she would like to have a referral to counseling or psychiatry.   Review of Systems    No ROS.  Medicare Wellness Visit. Additional risk factors are reflected in the social history. Cardiac Risk Factors include: advanced age (>37mn, >>47women);diabetes mellitus;dyslipidemia;hypertension;sedentary lifestyle;obesity (BMI >30kg/m2) Sleep patterns: sleeps excessively, feels rested on waking, gets up 2 times nightly to void     Home Safety/Smoke Alarms: Feels safe in home. Smoke alarms in place.  Living environment; residence and Firearm Safety: apartment, no firearms., Lives with significant other, limited support system Seat Belt Safety/Bike Helmet: Wears seat belt.     Objective:    Today's Vitals   11/20/17 1329 11/20/17 1330  BP: 138/74   Pulse: 73   Resp: 16   SpO2: 98%   Weight: 218 lb (98.9 kg)   Height: '5\' 2"'  (1.575 m)   PainSc:  2    Body mass index is 39.87 kg/m.  Advanced Directives 11/20/2017 01/14/2016  Does Patient Have a Medical Advance Directive? No No  Would patient like information on creating a medical advance directive? Yes (ED - Information included in AVS) No - patient declined information    Current Medications (verified) Outpatient Encounter Medications as of 11/20/2017  Medication Sig  . blood glucose meter kit and supplies KIT Dispense based on patient and insurance preference. Use once daily as directed. (FOR ICD-9 250.00, 250.01).  . Blood Pressure KIT Use as directed to monitor BP at home, dx: hypertension  . EASY COMFORT LANCETS MISC Use to help check blood sugars three times a day  . glucose blood (ACCU-CHEK AVIVA PLUS) test strip Use to check blood sugars three times a day  . loratadine (CLARITIN)  10 MG tablet Take 1 tablet (10 mg total) by mouth daily.  . metFORMIN (GLUCOPHAGE) 500 MG tablet Take 1 tablet (500 mg total) by mouth 2 (two) times daily with a meal.  . Multiple Vitamin (MULTIVITAMIN) tablet Take 1 tablet by mouth daily.  . naproxen sodium (ALEVE) 220 MG tablet Take 220 mg by mouth daily as needed.  .Marland Kitchenomeprazole (PRILOSEC) 20 MG capsule Take 1 capsule (20 mg total) by mouth daily.  . pregabalin (LYRICA) 25 MG capsule Take 1 capsule (25 mg total) by mouth 2 (two) times daily.  . sertraline (ZOLOFT) 100 MG tablet Take 1 tablet (100 mg total) by mouth daily.  . Vitamin D, Ergocalciferol, (DRISDOL) 50000 units CAPS capsule Take 1 capsule (50,000 Units total) by mouth every 7 (seven) days.  . [DISCONTINUED] loratadine (CLARITIN) 10 MG tablet Take 1 tablet (10 mg total) by mouth daily.  . [DISCONTINUED] omeprazole (PRILOSEC) 20 MG capsule Take 1 capsule (20 mg total) by mouth daily.  . [DISCONTINUED] meloxicam (MOBIC) 15 MG tablet Take 1 tablet (15 mg total) by mouth daily. (Patient not taking: Reported on 11/20/2017)   No facility-administered encounter medications on file as of 11/20/2017.     Allergies (verified) Iohexol; Gabapentin; and Iodine   History: Past Medical History:  Diagnosis Date  . Anxiety   . Depression   . Diabetes mellitus [place] complication ([place]   . Endometriosis   . Hypercholesteremia   . Migraine   . OSA (obstructive sleep apnea) 02/29/2016   Past Surgical History:  Procedure  Laterality Date  . ABDOMINAL SURGERY    . APPENDECTOMY  2008  . SPLENECTOMY  1978  . TONSILLECTOMY     Family History  Problem Relation Age of Onset  . Hypertension Mother   . Diabetes Mother   . Kidney disease Mother   . Hypertension Father   . Heart disease Father   . Diabetes Sister   . Hypertension Sister   . Kidney disease Sister   . Diabetes Brother    Social History   Socioeconomic History  . Marital status: Divorced    Spouse name: Not on file  .  Number of children: Not on file  . Years of education: Not on file  . Highest education level: Not on file  Occupational History  . Occupation: disabled  Social Needs  . Financial resource strain: Somewhat hard  . Food insecurity:    Worry: Sometimes true    Inability: Sometimes true  . Transportation needs:    Medical: No    Non-medical: No  Tobacco Use  . Smoking status: Never Smoker  . Smokeless tobacco: Never Used  Substance and Sexual Activity  . Alcohol use: No  . Drug use: No    Comment: in the past, pt claimed she used crack  . Sexual activity: Not Currently  Lifestyle  . Physical activity:    Days per week: 0 days    Minutes per session: 0 min  . Stress: Very much  Relationships  . Social connections:    Talks on phone: More than three times a week    Gets together: More than three times a week    Attends religious service: 1 to 4 times per year    Active member of club or organization: No    Attends meetings of clubs or organizations: Never    Relationship status: Living with partner  Other Topics Concern  . Not on file  Social History Narrative  . Not on file    Tobacco Counseling Counseling given: Not Answered  Activities of Daily Living In your present state of health, do you have any difficulty performing the following activities: 11/20/2017  Hearing? N  Vision? N  Difficulty concentrating or making decisions? N  Walking or climbing stairs? N  Dressing or bathing? N  Doing errands, shopping? N  Preparing Food and eating ? N  Using the Toilet? N  In the past six months, have you accidently leaked urine? N  Do you have problems with loss of bowel control? N  Managing your Medications? N  Managing your Finances? N  Housekeeping or managing your Housekeeping? N  Some recent data might be hidden     Immunizations and Health Maintenance Immunization History  Administered Date(s) Administered  . Influenza,inj,Quad PF,6+ Mos 03/14/2016  .  Pneumococcal Polysaccharide-23 07/29/2016   Health Maintenance Due  Topic Date Due  . TETANUS/TDAP  12/06/1979  . PAP SMEAR  12/05/1981  . MAMMOGRAM  06/02/2016  . OPHTHALMOLOGY EXAM  10/28/2017    Patient Care Team: Binnie Rail, MD as PCP - General (Internal Medicine)  Indicate any recent Medical Services you may have received from other than [facility] providers in the past year (date may be approximate).     Assessment:   This is a routine wellness examination for Monica Welch. Physical assessment deferred to PCP.   Hearing/Vision screen Hearing Screening Comments: Able to hear conversational tones w/o difficulty. No issues reported.  Passed whisper test Vision Screening Comments: Ophthalmology referral placed to assess retinopathy  Dietary issues and exercise activities discussed: Current Exercise Habits: The patient does not participate in regular exercise at present(balance and chair exercise print-outs provided)  Diet (meal preparation, eat out, water intake, caffeinated beverages, dairy products, fruits and vegetables): in general, a "healthy" diet   , reports difficulty affording food, especially healthy food.   Food resources provided to patient. Reviewed heart healthy and diabetic diet, encouraged patient to increase daily water intake.  Goals    . Patient Stated     Decrease the amount of stress I have by listening and dance like a fool. I want to walk downtown and check it out. I want to volunteer for Wheels for [facility].      Depression Screen PHQ 2/9 Scores 11/20/2017  PHQ - 2 Score 4  PHQ- 9 Score 10    Fall Risk Fall Risk  11/20/2017  Falls in the past year? No  Risk for fall due to : Impaired balance/gait    Cognitive Function:       Ad8 score reviewed for issues:  Issues making decisions: no  Less interest in hobbies / activities: no  Repeats questions, stories (family complaining): no  Trouble using ordinary gadgets (microwave, computer,  phone):no  Forgets the month or year: no  Mismanaging finances: no  Remembering appts: no  Daily problems with thinking and/or memory: no Ad8 score is= 0  Screening Tests Health Maintenance  Topic Date Due  . TETANUS/TDAP  12/06/1979  . PAP SMEAR  12/05/1981  . MAMMOGRAM  06/02/2016  . OPHTHALMOLOGY EXAM  10/28/2017  . COLONOSCOPY  11/21/2018 (Originally 12/06/2010)  . COLON CANCER SCREENING ANNUAL FOBT  12/15/2017  . INFLUENZA VACCINE  01/22/2018  . URINE MICROALBUMIN  01/27/2018  . HEMOGLOBIN A1C  03/27/2018  . FOOT EXAM  09/26/2018  . PNEUMOCOCCAL POLYSACCHARIDE VACCINE (2) 07/29/2021  . Hepatitis C Screening  Completed  . HIV Screening  Completed     Plan:     Nurse will notify PCP that patient is feeling depressed and would like to have a referral to counseling or psychiatry as this was beneficial in the past. An eye exam referral to assess for retinopathy was ordered as well as screening mammogram referral.   Resources were provided for eye glasses, [facility] for patient to schedule screening pap smear, and food pantries as well as [employer] for other community resources and Hospice grief counseling resource.    I have personally reviewed and noted the following in the patient's chart:   . Medical and social history . Use of alcohol, tobacco or illicit drugs  . Current medications and supplements . Functional ability and status . Nutritional status . Physical activity . Advanced directives . List of other physicians . Vitals . Screenings to include cognitive, depression, and falls . Referrals and appointments  In addition, I have reviewed and discussed with patient certain preventive protocols, quality metrics, and best practice recommendations. A written personalized care plan for preventive services as well as general preventive health recommendations were provided to patient.     Michiel Cowboy, RN   11/20/2017

## 2017-11-20 NOTE — Telephone Encounter (Signed)
Please call her - she does not need an official referral - she can and needs to make her own appt.  Here is a list of providers she can go to -- if may depend on which accepts her insurance where she goes and how quickly they can get her in.    May be easier to send via mychart if ok with her  [facility] at [facility] [phone]  [facility] [address] [place] [facility] [phone]  Dr Emerson Monte [address] [place], [place] [address] [phone]  [facility]  [address] [place], [place] [phone]  [facility], [facility] Office [address], [place], [place], [address] [phone] Office [phone] [facility]            [address] [place], [place] [phone]  Dr Lawerance Bach [facility] [address] [place], [place] [address] [phone]
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
## 2017-11-20 NOTE — Patient Instructions (Addendum)
www.auntbertha.com or down load app on smart phone  Aunt Doristine Counter website lists multiple social resources for individuals such as: food, health, money, house hold goods, transit, medical supplies, job training and legal services.  [facility] care center in [place] Located in: [place] Address: [address] [place], [place] [address] Phone: [phone]  [facility] in [place], [place] [facility] is a 134-bed hospital in [place], [place]. It is part of [place] and is [place] first free-standing hospital dedicated to women. For infants who need special care, [facility] has a Level II and Level III neonatal intensive care unit. [employer] Address: [address] [place], [place] [address] Phone: [phone]  Continue doing brain stimulating activities (puzzles, reading, adult coloring books, staying active) to keep memory sharp.   Continue to eat heart healthy diet (full of fruits, vegetables, whole grains, lean protein, water--limit salt, fat, and sugar intake) and increase physical activity as tolerated.   Ms. Jantz , Thank you for taking time to come for your Medicare Wellness Visit. I appreciate your ongoing commitment to your health goals. Please review the following plan we discussed and let me know if I can assist you in the future.   These are the goals we discussed: Goals    . Patient Stated     Decrease the amount of stress I have by listening and dance like a fool. I want to walk downtown and check it out. I want to volunteer for Wheels for [facility].       This is a list of the screening recommended for you and due dates:  Health Maintenance  Topic Date Due  . Tetanus Vaccine  12/06/1979  . Pap Smear  12/05/1981  . Colon Cancer Screening  12/06/2010  . Mammogram  06/02/2016  . Eye exam for diabetics  10/28/2017  .  Stool Blood Test  12/15/2017  . Flu Shot  01/22/2018  . Urine Protein Check  01/27/2018  . Hemoglobin A1C  03/27/2018  . Complete foot exam   09/26/2018  . Pneumococcal vaccine (2) 07/29/2021  .  Hepatitis C: One time screening is recommended by Center for Disease Control  (CDC) for  adults born from [birth year] through 1965.   Completed  . HIV Screening  Completed

## 2017-11-21 NOTE — Telephone Encounter (Signed)
LVM informing pt that a MyChart message will be sent with listings of offices for Counseling and Pysch

## 2017-11-25 ENCOUNTER — Other Ambulatory Visit: Payer: Self-pay | Admitting: Internal Medicine

## 2017-11-25 DIAGNOSIS — Z1231 Encounter for screening mammogram for malignant neoplasm of breast: Secondary | ICD-10-CM

## 2017-11-27 ENCOUNTER — Other Ambulatory Visit: Payer: Self-pay | Admitting: Internal Medicine

## 2017-11-27 DIAGNOSIS — N63 Unspecified lump in unspecified breast: Secondary | ICD-10-CM

## 2017-12-01 ENCOUNTER — Other Ambulatory Visit: Payer: Medicare Other

## 2017-12-01 ENCOUNTER — Inpatient Hospital Stay: Admission: RE | Admit: 2017-12-01 | Payer: Medicare Other | Source: Ambulatory Visit

## 2017-12-03 NOTE — Progress Notes (Signed)
Medical screening examination/treatment/procedure(s) were performed by non-physician practitioner and as supervising physician I was immediately available for consultation/collaboration. I agree with above. Elizabeth A Crawford, MD 

## 2017-12-12 ENCOUNTER — Other Ambulatory Visit: Payer: Medicare Other

## 2017-12-26 ENCOUNTER — Ambulatory Visit: Payer: Medicare Other | Admitting: Internal Medicine
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
## 2018-02-25 ENCOUNTER — Encounter: Payer: Medicare Other | Admitting: Internal Medicine
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
## 2018-02-27 ENCOUNTER — Ambulatory Visit (INDEPENDENT_AMBULATORY_CARE_PROVIDER_SITE_OTHER): Payer: Medicare Other | Admitting: Internal Medicine

## 2018-02-27 ENCOUNTER — Other Ambulatory Visit (INDEPENDENT_AMBULATORY_CARE_PROVIDER_SITE_OTHER): Payer: Medicare Other

## 2018-02-27 ENCOUNTER — Encounter: Payer: Self-pay | Admitting: Internal Medicine

## 2018-02-27 VITALS — BP 124/80 | HR 73 | Temp 98.0°F | Resp 16 | Ht 62.0 in | Wt 219.8 lb

## 2018-02-27 DIAGNOSIS — K219 Gastro-esophageal reflux disease without esophagitis: Secondary | ICD-10-CM | POA: Diagnosis not present

## 2018-02-27 DIAGNOSIS — G629 Polyneuropathy, unspecified: Secondary | ICD-10-CM

## 2018-02-27 DIAGNOSIS — E119 Type 2 diabetes mellitus without complications: Secondary | ICD-10-CM

## 2018-02-27 DIAGNOSIS — F32A Depression, unspecified: Secondary | ICD-10-CM

## 2018-02-27 DIAGNOSIS — Z23 Encounter for immunization: Secondary | ICD-10-CM

## 2018-02-27 DIAGNOSIS — F419 Anxiety disorder, unspecified: Secondary | ICD-10-CM

## 2018-02-27 DIAGNOSIS — F329 Major depressive disorder, single episode, unspecified: Secondary | ICD-10-CM

## 2018-02-27 LAB — COMPREHENSIVE METABOLIC PANEL
ALT: 26 U/L (ref 0–35)
AST: 16 U/L (ref 0–37)
Albumin: 4 g/dL (ref 3.5–5.2)
Alkaline Phosphatase: 40 U/L (ref 39–117)
BILIRUBIN TOTAL: 1.1 mg/dL (ref 0.2–1.2)
BUN: 18 mg/dL (ref 6–23)
CO2: 27 meq/L (ref 19–32)
CREATININE: 1.14 mg/dL (ref 0.40–1.20)
Calcium: 9.6 mg/dL (ref 8.4–10.5)
Chloride: 106 mEq/L (ref 96–112)
GFR: 52.17 mL/min — ABNORMAL LOW (ref >60.00)
GLUCOSE: 151 mg/dL — AB (ref 70–99)
Potassium: 4 mEq/L (ref 3.5–5.1)
Sodium: 140 mEq/L (ref 135–145)
TOTAL PROTEIN: 7.6 g/dL (ref 6.0–8.3)

## 2018-02-27 LAB — CBC WITH DIFFERENTIAL/PLATELET
BASOS ABS: 0.1 10*3/uL (ref 0.0–0.1)
Basophils Relative: 0.8 % (ref 0.0–3.0)
EOS PCT: 3.9 % (ref 0.0–5.0)
Eosinophils Absolute: 0.5 10*3/uL (ref 0.0–0.7)
HEMATOCRIT: 48.2 % — AB (ref 36.0–46.0)
Hemoglobin: 15.9 g/dL — ABNORMAL HIGH (ref 12.0–15.0)
LYMPHS ABS: 2.6 10*3/uL (ref 0.7–4.0)
LYMPHS PCT: 22.3 % (ref 12.0–46.0)
MCHC: 33 g/dL (ref 30.0–36.0)
MCV: 91.4 fl (ref 78.0–100.0)
MONOS PCT: 7.8 % (ref 3.0–12.0)
Monocytes Absolute: 0.9 10*3/uL (ref 0.1–1.0)
Neutro Abs: 7.6 10*3/uL (ref 1.4–7.7)
Neutrophils Relative %: 65.2 % (ref 43.0–77.0)
Platelets: 288 10*3/uL (ref 150.0–400.0)
RBC: 5.27 Mil/uL — ABNORMAL HIGH (ref 3.87–5.11)
RDW: 13.4 % (ref 11.5–15.5)
WBC: 11.6 10*3/uL — ABNORMAL HIGH (ref 4.0–10.5)

## 2018-02-27 LAB — VITAMIN B12: Vitamin B-12: 344 pg/mL (ref 211–911)

## 2018-02-27 LAB — MICROALBUMIN / CREATININE URINE RATIO
CREATININE, U: 347.6 mg/dL
MICROALB/CREAT RATIO: 1 mg/g (ref 0.0–30.0)
Microalb, Ur: 3.6 mg/dL — ABNORMAL HIGH (ref 0.0–1.9)

## 2018-02-27 LAB — HEMOGLOBIN A1C: Hgb A1c MFr Bld: 8.3 % — ABNORMAL HIGH (ref 4.6–6.5)

## 2018-02-27 MED ORDER — PREGABALIN 50 MG PO CAPS: 50.0000 mg | ORAL_CAPSULE | Freq: Two times a day (BID) | ORAL | 5 refills | Status: DC

## 2018-02-27 MED ORDER — SERTRALINE HCL 100 MG PO TABS: 150.0000 mg | ORAL_TABLET | Freq: Every day | ORAL | 1 refills | Status: DC

## 2018-02-27 MED ORDER — OMEPRAZOLE 40 MG PO CPDR: 40.0000 mg | DELAYED_RELEASE_CAPSULE | Freq: Every day | ORAL | 3 refills | Status: DC

## 2018-02-27 NOTE — Assessment & Plan Note (Signed)
Likely secondary to diabetes Lyrica did help, but 25 mg twice daily was not strong enough-we will increase to 50 mg twice daily Her insurance that they would not pay for this, but she did not tolerate gabapentin and cannot be on Cymbalta or Effexor because she is already on a different SSRI, therefore we will need to complete prior authorization for Lyrica which is medically necessary

## 2018-02-27 NOTE — Assessment & Plan Note (Signed)
She is needed to increase the omeprazole to 40 mg-taking 220 mg pills With that dose her GERD is controlled Start omeprazole 40 mg daily-new prescription sent to pharmacy
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
## 2018-02-27 NOTE — Patient Instructions (Addendum)
  Test(s) ordered today. Your results will be released to MyChart (or called to you) after review, usually within 72hours after test completion. If any changes need to be made, you will be notified at that same time.   Tetanus and flu immunizations administered today.   Medications reviewed and updated.  Changes include increasing the sertraline to 150 mg daily, increasing the lyrica to 50 mg twice daily and increasing omeprazole to 40 mg daily.   Your prescription(s) have been submitted to your pharmacy. Please take as directed and contact our office if you believe you are having problem(s) with the medication(s).   Please followup in 1 month    [facility] at [facility] [phone]  [facility] [address] [place] [facility] [phone]  Dr Emerson Monte [address] [place] Knack [place], [place] [address] [phone]  [facility]  [address] [place], [place] [phone]  [facility], [facility] Office [address], [place], [place], [address] [phone] Office [phone] [facility]            [address] [place], [place] [phone]  [facility]    - walk ins only for initial viist   M-F  8am - 3pm [address] [place], [place]  [phone]

## 2018-02-27 NOTE — Assessment & Plan Note (Signed)
She is taking her medication as prescribed She has not been compliant with a diabetic diet and has not been exercising secondary to severe depression Will check A1c and adjust medication if needed Also check urine microalbumin

## 2018-03-02 MED ORDER — METFORMIN HCL 500 MG PO TABS: 500.0000 mg | ORAL_TABLET | Freq: Two times a day (BID) | ORAL | 1 refills | Status: DC

## 2018-03-05 ENCOUNTER — Telehealth: Payer: Self-pay | Admitting: Internal Medicine

## 2018-03-05 NOTE — Telephone Encounter (Signed)
Copied from CRM [phone]. Topic: General - Call Back - No Documentation >> Mar 05, 2018  4:35 PM Terisa Starraylor, Brittany L wrote: Reason for CRM: Patient said someone from the office just called about her " Colo-guard " test. She said that she contacted her insurance to see if it was covered, it is covered so that can be ordered for her.

## 2018-03-13 LAB — COLOGUARD
COLOGUARD: NEGATIVE
COLOGUARD: NEGATIVE

## 2018-03-13 NOTE — Telephone Encounter (Signed)
Order has been placed for cologuard.

## 2018-03-26 DIAGNOSIS — Z1212 Encounter for screening for malignant neoplasm of rectum: Secondary | ICD-10-CM | POA: Diagnosis not present

## 2018-03-26 DIAGNOSIS — Z1211 Encounter for screening for malignant neoplasm of colon: Secondary | ICD-10-CM | POA: Diagnosis not present
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
## 2018-03-30 ENCOUNTER — Ambulatory Visit: Payer: Medicare Other | Admitting: Internal Medicine

## 2018-03-30 DIAGNOSIS — Z0289 Encounter for other administrative examinations: Secondary | ICD-10-CM

## 2018-04-02 ENCOUNTER — Encounter: Payer: Self-pay | Admitting: Internal Medicine

## 2018-04-15 ENCOUNTER — Other Ambulatory Visit: Payer: Medicare Other

## 2018-04-18 ENCOUNTER — Telehealth: Payer: Self-pay | Admitting: Internal Medicine

## 2018-04-18 NOTE — Telephone Encounter (Signed)
Let her know her cologuard test is negative.  This should ve repeated in 3 years.

## 2018-04-20 ENCOUNTER — Encounter: Payer: Self-pay | Admitting: Internal Medicine

## 2018-04-20 NOTE — Telephone Encounter (Signed)
LVM letting pt know results. Asked her to call back with any questions or concerns if needed.

## 2018-04-28 DIAGNOSIS — H5203 Hypermetropia, bilateral: Secondary | ICD-10-CM | POA: Diagnosis not present

## 2018-04-28 DIAGNOSIS — H25013 Cortical age-related cataract, bilateral: Secondary | ICD-10-CM | POA: Diagnosis not present

## 2018-04-28 DIAGNOSIS — E119 Type 2 diabetes mellitus without complications: Secondary | ICD-10-CM | POA: Diagnosis not present

## 2018-04-28 LAB — HM DIABETES EYE EXAM

## 2018-05-05 IMAGING — CT CT ABD-PELV W/O CM
3 of 4 series · 12 of 36 positions shown, 18 images · IV contrast (READICAT/WATER)
Comparison: None.

CLINICAL DATA: Suprapubic and right upper quadrant abdominal pain
over the last 2 months, history of appendectomy in 7448, history of
endometriosis and splenectomy

EXAM:
CT ABDOMEN AND PELVIS WITHOUT CONTRAST
TECHNIQUE: Multidetector CT imaging of the abdomen and pelvis was performed
following the standard protocol without IV contrast.

[Series 3: abd/pelvis w/o · axial · non-contrast · 0.90mm/px · z∈[-344,+31]mm · 8 of 97 slices shown, 13 images]
[im 11/97  soft-tissue]
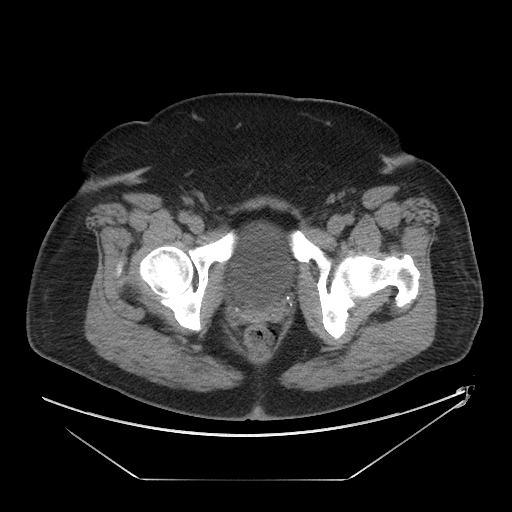
[im 11/97  bone]
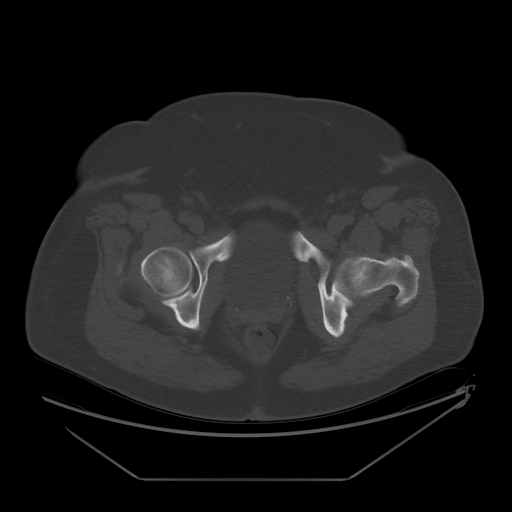
[im 22/97  soft-tissue]
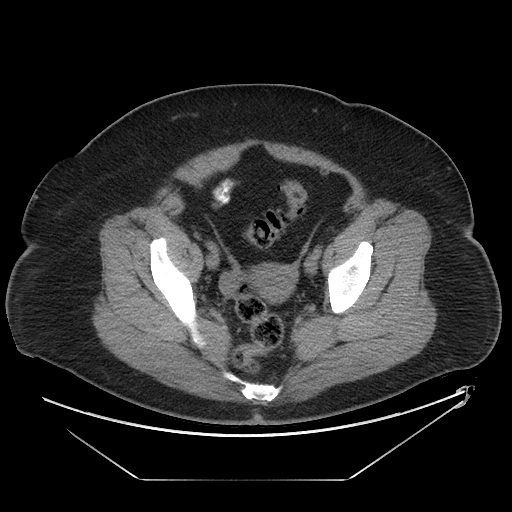
[im 33/97  soft-tissue]
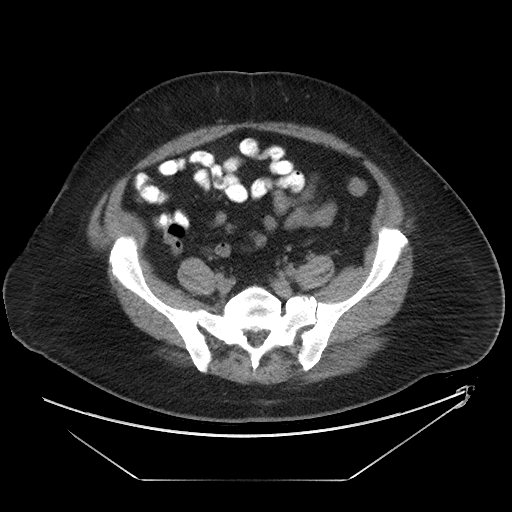
[im 43/97  soft-tissue]
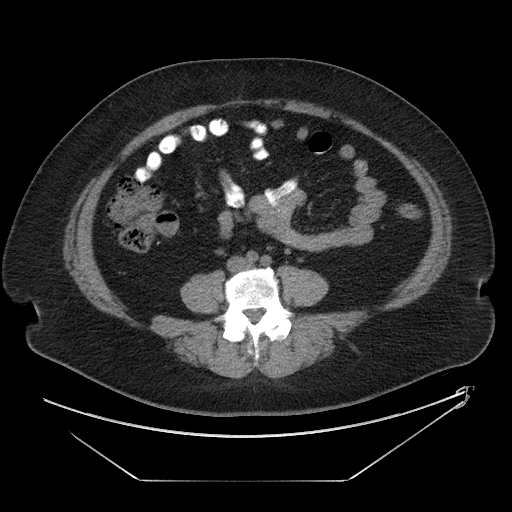
[im 54/97  soft-tissue]
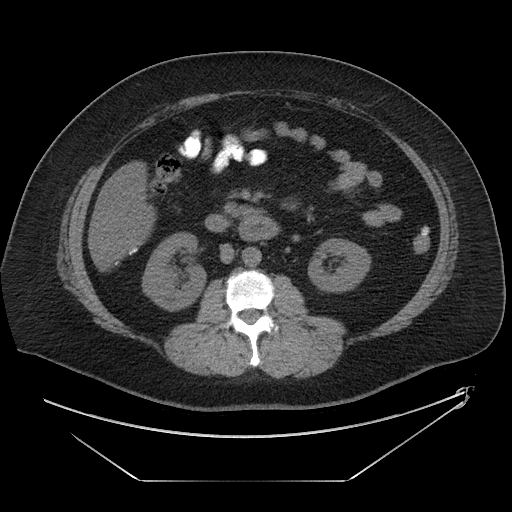
[im 54/97  lung]
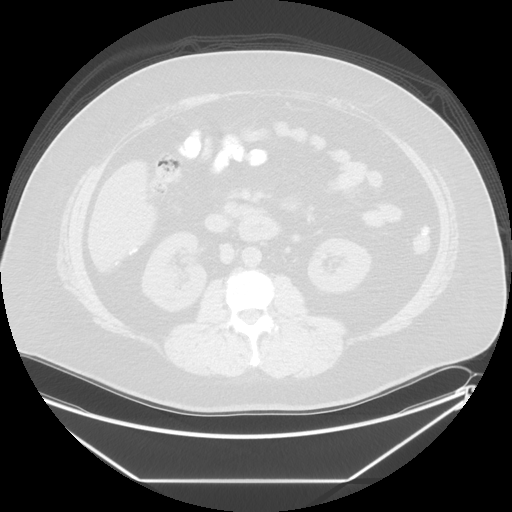
[im 65/97  soft-tissue]
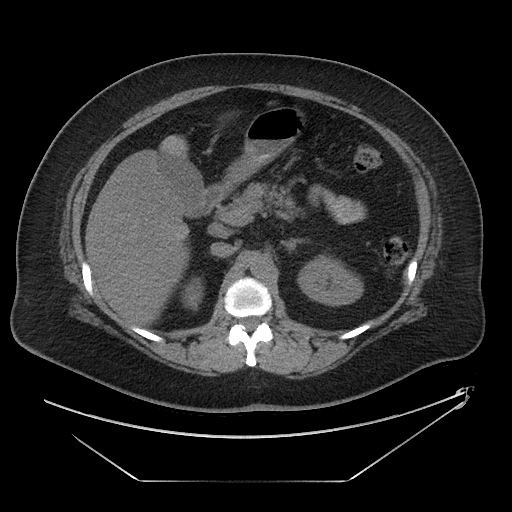
[im 65/97  lung]
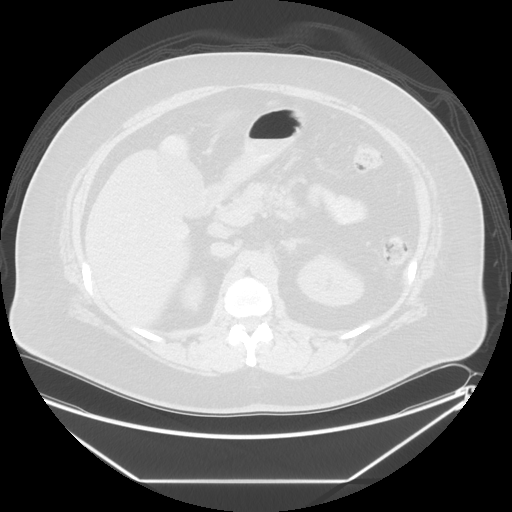
[im 75/97  soft-tissue]
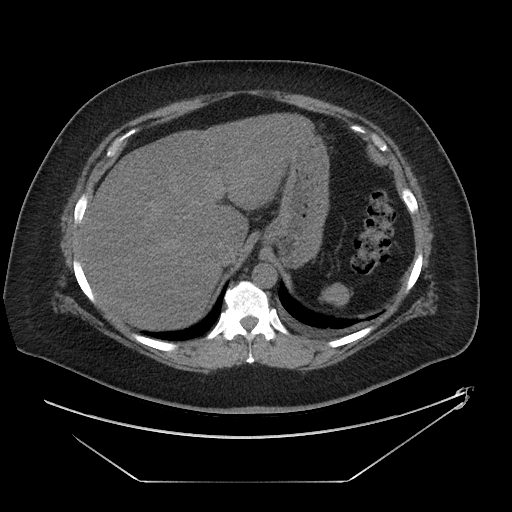
[im 75/97  lung]
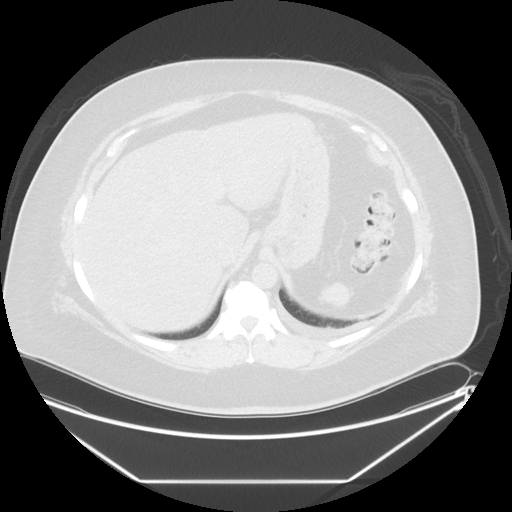
[im 86/97  soft-tissue]
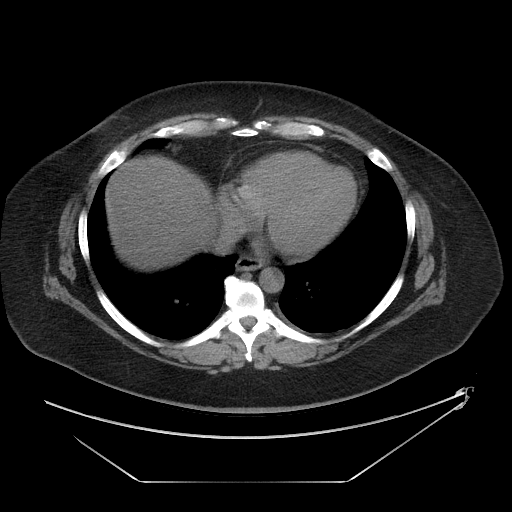
[im 86/97  lung]
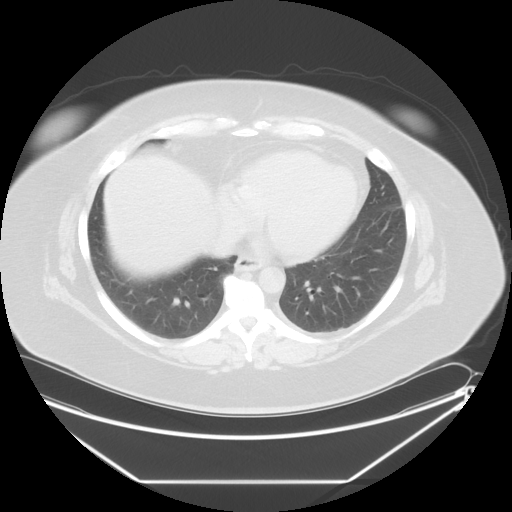

[Series 601: coronal body · coronal · 0.99mm/px · 1 of 137 slices shown, 2 images]
[im 46/137  soft-tissue]
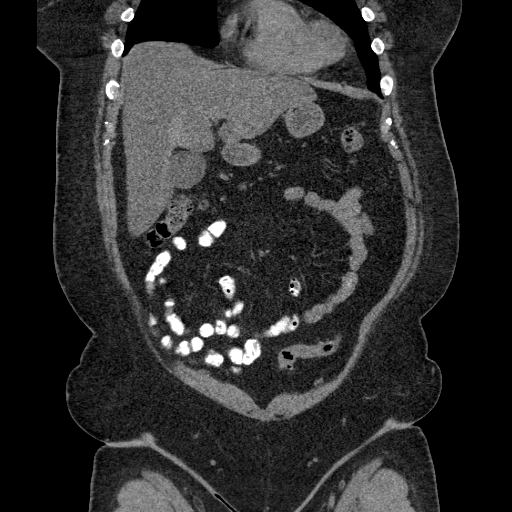
[im 46/137  bone]
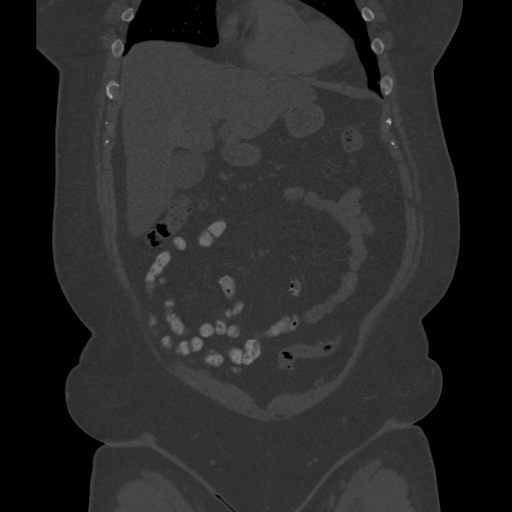

[Series 602: sagittal body · sagittal · 0.99mm/px · 3 of 185 slices shown]
[im 21/185  soft-tissue]
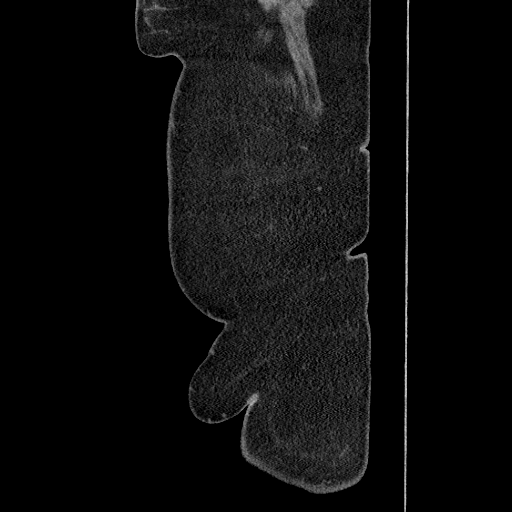
[im 41/185  soft-tissue]
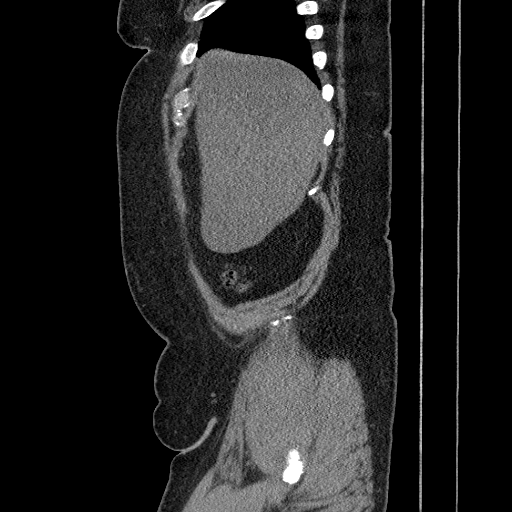
[im 62/185  soft-tissue]
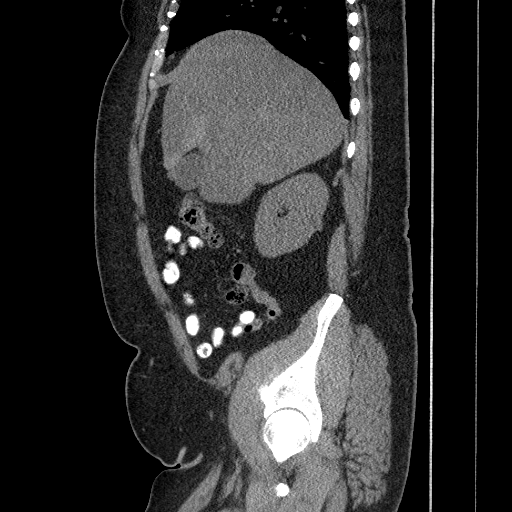

[12 of 36 positions shown; findings below may reference images not displayed]

FINDINGS: Lower chest: The lung bases are clear.

Hepatobiliary: The liver is somewhat low in attenuation suggesting
fatty infiltration with some sparing near the gallbladder. No
calcified gallstones are seen.

Pancreas: The pancreas is normal in size and the pancreatic duct is
not dilated.

Spleen: The spleen has been resected and there is some residual
splenic tissue noted within the left upper quadrant.

Adrenals/Urinary Tract: The adrenal glands are symmetrical and
normal. And kidneys are unremarkable in the unenhanced state. No
renal calculi are seen and there is no evidence of hydronephrosis. A
small low-attenuation posteriorly in right mid kidney probably
represents a cyst but is too small to characterize on the current
study which is unenhanced. The proximal ureters are normal in
caliber. The urinary bladder is not well distended but no
abnormality is seen.

Stomach/Bowel: The stomach is not well distended. No dilatation of
large or small bowel is seen. A few scattered rectosigmoid colon
diverticula are present but no diverticulitis is noted. Surgical
clips are noted from prior appendectomy. The terminal ileum is
unremarkable.

Vascular/Lymphatic: The abdominal aorta is normal in caliber. No
aneurysmal dilatation is seen. No adenopathy is noted.

Reproductive: The uterus is normal in size. No adnexal lesion is
seen. No free fluid is noted within the pelvis.

Other: None

Musculoskeletal: The lumbar vertebrae are in normal alignment. There
is mild degenerative disc disease at L5-S1. Mild degenerative joint
disease of hips also is noted. The SI joints are corticated. There
are degenerative changes of the facet joints of the lower lumbar
spine.
IMPRESSION: 1. No explanation for the patient suprapubic and right upper
quadrant pain is seen.
2. Suspect diffuse fatty infiltration the liver with some sparing
near the gallbladder. Correlate with LFTs.
3. Degenerative change of the facet joints of the lower lumbar spine
with mild degenerative disc disease at L5-S1.

## 2018-05-07 ENCOUNTER — Encounter: Payer: Self-pay | Admitting: Internal Medicine

## 2018-05-14 MED ORDER — METFORMIN HCL 500 MG PO TABS: ORAL_TABLET | ORAL | 1 refills | Status: DC
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
## 2018-05-15 ENCOUNTER — Encounter: Payer: Medicare Other | Admitting: Internal Medicine

## 2018-05-15 DIAGNOSIS — Z0289 Encounter for other administrative examinations: Secondary | ICD-10-CM
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
## 2018-08-19 ENCOUNTER — Encounter

## 2018-08-19 ENCOUNTER — Encounter: Payer: Self-pay | Admitting: Internal Medicine

## 2018-08-19 ENCOUNTER — Ambulatory Visit (INDEPENDENT_AMBULATORY_CARE_PROVIDER_SITE_OTHER): Payer: Medicare Other | Admitting: Internal Medicine

## 2018-08-19 ENCOUNTER — Ambulatory Visit (INDEPENDENT_AMBULATORY_CARE_PROVIDER_SITE_OTHER)
Admission: RE | Admit: 2018-08-19 | Discharge: 2018-08-19 | Disposition: A | Discharge status: Home / Self Care | Payer: Medicare Other | Source: Ambulatory Visit | Attending: Internal Medicine | Admitting: Internal Medicine

## 2018-08-19 ENCOUNTER — Other Ambulatory Visit (INDEPENDENT_AMBULATORY_CARE_PROVIDER_SITE_OTHER): Payer: Medicare Other

## 2018-08-19 VITALS — BP 126/80 | HR 77 | Temp 97.9°F | Resp 16 | Ht 62.0 in | Wt 212.0 lb

## 2018-08-19 DIAGNOSIS — F419 Anxiety disorder, unspecified: Secondary | ICD-10-CM | POA: Diagnosis not present

## 2018-08-19 DIAGNOSIS — M542 Cervicalgia: Secondary | ICD-10-CM

## 2018-08-19 DIAGNOSIS — M79601 Pain in right arm: Secondary | ICD-10-CM

## 2018-08-19 DIAGNOSIS — E119 Type 2 diabetes mellitus without complications: Secondary | ICD-10-CM | POA: Diagnosis not present

## 2018-08-19 DIAGNOSIS — E1142 Type 2 diabetes mellitus with diabetic polyneuropathy: Secondary | ICD-10-CM

## 2018-08-19 DIAGNOSIS — G629 Polyneuropathy, unspecified: Secondary | ICD-10-CM

## 2018-08-19 DIAGNOSIS — K219 Gastro-esophageal reflux disease without esophagitis: Secondary | ICD-10-CM | POA: Diagnosis not present

## 2018-08-19 DIAGNOSIS — F329 Major depressive disorder, single episode, unspecified: Secondary | ICD-10-CM

## 2018-08-19 DIAGNOSIS — R030 Elevated blood-pressure reading, without diagnosis of hypertension: Secondary | ICD-10-CM

## 2018-08-19 LAB — COMPREHENSIVE METABOLIC PANEL
ALT: 23 U/L (ref 0–35)
AST: 18 U/L (ref 0–37)
Albumin: 4.2 g/dL (ref 3.5–5.2)
Alkaline Phosphatase: 51 U/L (ref 39–117)
BUN: 24 mg/dL — ABNORMAL HIGH (ref 6–23)
CHLORIDE: 103 meq/L (ref 96–112)
CO2: 24 mEq/L (ref 19–32)
Calcium: 9.5 mg/dL (ref 8.4–10.5)
Creatinine, Ser: 1.13 mg/dL (ref 0.40–1.20)
GFR: 49.51 mL/min — ABNORMAL LOW (ref >60.00)
Glucose, Bld: 147 mg/dL — ABNORMAL HIGH (ref 70–99)
POTASSIUM: 4 meq/L (ref 3.5–5.1)
Sodium: 138 mEq/L (ref 135–145)
Total Bilirubin: 0.7 mg/dL (ref 0.2–1.2)
Total Protein: 7.8 g/dL (ref 6.0–8.3)

## 2018-08-19 LAB — CBC WITH DIFFERENTIAL/PLATELET
Basophils Absolute: 0 10*3/uL (ref 0.0–0.1)
Basophils Relative: 0.1 % (ref 0.0–3.0)
EOS PCT: 4.1 % (ref 0.0–5.0)
Eosinophils Absolute: 0.4 10*3/uL (ref 0.0–0.7)
HCT: 50 % — ABNORMAL HIGH (ref 36.0–46.0)
Hemoglobin: 16.9 g/dL — ABNORMAL HIGH (ref 12.0–15.0)
Lymphocytes Relative: 26.2 % (ref 12.0–46.0)
Lymphs Abs: 2.8 10*3/uL (ref 0.7–4.0)
MCHC: 33.7 g/dL (ref 30.0–36.0)
MCV: 90 fl (ref 78.0–100.0)
MONOS PCT: 6.1 % (ref 3.0–12.0)
Monocytes Absolute: 0.7 10*3/uL (ref 0.1–1.0)
Neutro Abs: 6.7 10*3/uL (ref 1.4–7.7)
Neutrophils Relative %: 63.5 % (ref 43.0–77.0)
Platelets: 278 10*3/uL (ref 150.0–400.0)
RBC: 5.55 Mil/uL — ABNORMAL HIGH (ref 3.87–5.11)
RDW: 13.9 % (ref 11.5–15.5)
WBC: 10.6 10*3/uL — ABNORMAL HIGH (ref 4.0–10.5)

## 2018-08-19 LAB — HEMOGLOBIN A1C: Hgb A1c MFr Bld: 8 % — ABNORMAL HIGH (ref 4.6–6.5)

## 2018-08-19 LAB — LIPID PANEL
CHOL/HDL RATIO: 5
Cholesterol: 202 mg/dL — ABNORMAL HIGH (ref 0–200)
HDL: 37.7 mg/dL — ABNORMAL LOW (ref >39.00)
NonHDL: 163.84
Triglycerides: 314 mg/dL — ABNORMAL HIGH (ref 0.0–149.0)
VLDL: 62.8 mg/dL — ABNORMAL HIGH (ref 0.0–40.0)

## 2018-08-19 LAB — LDL CHOLESTEROL, DIRECT: Direct LDL: 147 mg/dL

## 2018-08-19 MED ORDER — DULOXETINE HCL 30 MG PO CPEP: 30.0000 mg | ORAL_CAPSULE | Freq: Every day | ORAL | 5 refills | Status: DC

## 2018-08-19 MED ORDER — PREGABALIN 50 MG PO CAPS: 50.0000 mg | ORAL_CAPSULE | Freq: Two times a day (BID) | ORAL | 5 refills | Status: DC

## 2018-08-19 NOTE — Assessment & Plan Note (Signed)
GERD controlled with increased dose of omeprazole Continue daily medication

## 2018-08-19 NOTE — Assessment & Plan Note (Signed)
Experiencing neck pain and right arm pain ?  Cervical radiculopathy We will get x-ray of the neck today Will refer to sports medicine for further evaluation

## 2018-08-19 NOTE — Patient Instructions (Addendum)
Have an xray of your neck and blood work today.  Tests ordered today. Your results will be released to MyChart (or called to you) after review, usually within 72hours after test completion. If any changes need to be made, you will be notified at that same time.  Medications reviewed and updated.  Changes include :   Decrease sertraline to 100 mg (1 pill) daily x 5 days, then 50 mg ( 1/2 pill) for 5 days then stop.   Once you stop the medication start cymbalta 30 mg daily ( sent to pharmacy).    Your prescription(s) have been submitted to your pharmacy. Please take as directed and contact our office if you believe you are having problem(s) with the medication(s).   Please followup in 2 months

## 2018-08-19 NOTE — Assessment & Plan Note (Addendum)
Neuropathy in hands and feet-numbness/tingling and pain in her toes and fingertips Currently not taking Lipitor because her insurance company will not cover it Has not tolerated gabapentin Well tolerable to pharmacy to see if it will be covered this year-if not we will need to do a prior authorization We will change her sertraline to Cymbalta Check B12 level Follow-up in 2 months

## 2018-08-19 NOTE — Assessment & Plan Note (Signed)
BP controlled here today and has been controlled at home No medication needed at this time

## 2018-08-19 NOTE — Assessment & Plan Note (Signed)
Check A1c Stressed low sugar/carbohydrate diet Encouraged regular exercise, which she is not doing

## 2018-08-19 NOTE — Assessment & Plan Note (Signed)
Experiencing neck pain and right arm pain ?  Cervical radiculopathy We will get x-ray of the neck today Will refer to sports medicine for further evaluation 

## 2018-08-19 NOTE — Assessment & Plan Note (Signed)
Fairly controlled under the circumstances with sertraline at 150 mg daily, but would benefit from being on cymbalta for neuropathy Tapering off sertraline and start Cymbalta 30 mg daily-we will titrate as tolerated Follow-up in 2 months

## 2018-08-20 LAB — VITAMIN B12: Vitamin B-12: 252 pg/mL (ref 211–911)

## 2018-08-21 ENCOUNTER — Ambulatory Visit (INDEPENDENT_AMBULATORY_CARE_PROVIDER_SITE_OTHER): Payer: Medicare Other | Admitting: Family Medicine

## 2018-08-21 ENCOUNTER — Encounter: Payer: Self-pay | Admitting: Family Medicine

## 2018-08-21 DIAGNOSIS — M542 Cervicalgia: Secondary | ICD-10-CM

## 2018-08-21 DIAGNOSIS — M79601 Pain in right arm: Secondary | ICD-10-CM | POA: Diagnosis not present

## 2018-08-21 MED ORDER — PREDNISONE 5 MG PO TABS: ORAL_TABLET | ORAL | 0 refills | Status: DC

## 2018-08-21 NOTE — Assessment & Plan Note (Signed)
Arm pain seems to be radicular in nature. Along the C5-6 dermatome.  - prednisone  - counseled on posture  - if no improvement may need to consider MRI

## 2018-08-21 NOTE — Patient Instructions (Signed)
Nice to meet you  Please try the exercises and stretches  Please try heat on the area  Please follow up in 4 weeks if no better.
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
## 2018-08-21 NOTE — Assessment & Plan Note (Signed)
Neck pain seems more related to degenerative changes than nerve related.  - counseled on HEP and supportive care - if no improvement consider physical therapy or trigger point injections.

## 2018-08-24 ENCOUNTER — Other Ambulatory Visit: Payer: Self-pay | Admitting: Internal Medicine

## 2018-08-24 MED ORDER — ATORVASTATIN CALCIUM 20 MG PO TABS: 20.0000 mg | ORAL_TABLET | Freq: Every day | ORAL | 0 refills | Status: DC

## 2018-08-24 NOTE — Addendum Note (Signed)
Addended by: Mercer Pod E on: 08/24/2018 04:37 PM   Modules accepted: Orders

## 2018-08-25 ENCOUNTER — Other Ambulatory Visit: Payer: Self-pay | Admitting: Internal Medicine

## 2018-08-25 MED ORDER — SITAGLIPTIN PHOSPHATE 100 MG PO TABS: 100.0000 mg | ORAL_TABLET | Freq: Every day | ORAL | 5 refills | Status: DC

## 2018-08-27 ENCOUNTER — Other Ambulatory Visit: Payer: Self-pay | Admitting: Internal Medicine

## 2018-08-27 DIAGNOSIS — E119 Type 2 diabetes mellitus without complications: Secondary | ICD-10-CM

## 2018-08-27 DIAGNOSIS — Z6841 Body Mass Index (BMI) 40.0 and over, adult: Secondary | ICD-10-CM

## 2018-08-27 DIAGNOSIS — E66813 Obesity, class 3: Secondary | ICD-10-CM

## 2018-10-20 ENCOUNTER — Ambulatory Visit: Payer: Medicare Other | Admitting: Internal Medicine

## 2018-11-16 ENCOUNTER — Encounter (HOSPITAL_COMMUNITY): Payer: Self-pay | Admitting: *Deleted

## 2018-11-16 ENCOUNTER — Emergency Department (HOSPITAL_COMMUNITY)
Admission: EM | Admit: 2018-11-16 | Discharge: 2018-11-16 | Disposition: A | Discharge status: Home / Self Care | Payer: Medicare Other | Attending: Emergency Medicine | Admitting: Emergency Medicine

## 2018-11-16 ENCOUNTER — Other Ambulatory Visit: Payer: Self-pay

## 2018-11-16 DIAGNOSIS — R300 Dysuria: Secondary | ICD-10-CM | POA: Diagnosis present

## 2018-11-16 DIAGNOSIS — Z7984 Long term (current) use of oral hypoglycemic drugs: Secondary | ICD-10-CM | POA: Diagnosis not present

## 2018-11-16 DIAGNOSIS — N762 Acute vulvitis: Secondary | ICD-10-CM

## 2018-11-16 DIAGNOSIS — Z79899 Other long term (current) drug therapy: Secondary | ICD-10-CM | POA: Diagnosis not present

## 2018-11-16 DIAGNOSIS — E119 Type 2 diabetes mellitus without complications: Secondary | ICD-10-CM | POA: Diagnosis not present

## 2018-11-16 LAB — URINALYSIS, ROUTINE W REFLEX MICROSCOPIC
Bilirubin Urine: NEGATIVE
Glucose, UA: >=500 mg/dL — AB
Ketones, ur: NEGATIVE mg/dL
Leukocytes,Ua: NEGATIVE
Nitrite: NEGATIVE
Protein, ur: NEGATIVE mg/dL
Specific Gravity, Urine: 1.027 (ref 1.005–1.030)
pH: 5 (ref 5.0–8.0)

## 2018-11-16 LAB — CBG MONITORING, ED: Glucose-Capillary: 388 mg/dL — ABNORMAL HIGH (ref 70–99)

## 2018-11-16 MED ORDER — NYSTATIN 100000 UNIT/GM EX CREA: TOPICAL_CREAM | CUTANEOUS | 1 refills | Status: DC

## 2018-11-16 NOTE — ED Triage Notes (Signed)
Urinary frequency, burning with urination and back pain x 4 days. No fevers, no vomiting, some nausea.

## 2018-11-16 NOTE — Discharge Instructions (Signed)
Please start taking your home medications as prescribed.   Call your insurance company for assistance with finding a therapist.
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
## 2018-11-27 ENCOUNTER — Ambulatory Visit: Payer: Medicare Other

## 2018-12-07 DIAGNOSIS — E538 Deficiency of other specified B group vitamins: Secondary | ICD-10-CM | POA: Insufficient documentation
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
## 2018-12-07 NOTE — Patient Instructions (Signed)
  Tests ordered today. Your results will be released to MyChart (or called to you) after review, usually within 72hours after test completion. If any changes need to be made, you will be notified at that same time.  All other Health Maintenance issues reviewed.   All recommended immunizations and age-appropriate screenings are up-to-date or discussed.  No immunizations administered today.   Medications reviewed and updated.  Changes include :     Your prescription(s) have been submitted to your pharmacy. Please take as directed and contact our office if you believe you are having problem(s) with the medication(s).  A referral was ordered for   Please followup in    

## 2018-12-08 ENCOUNTER — Encounter: Payer: Medicare Other | Admitting: Internal Medicine

## 2019-01-07 ENCOUNTER — Telehealth: Payer: Self-pay | Admitting: *Deleted

## 2019-01-07 ENCOUNTER — Ambulatory Visit: Payer: Medicare Other
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
## 2019-01-07 NOTE — Telephone Encounter (Signed)
Called patient x2 to contact for scheduled telephonic AWV. Nurse received no answer from patient. Nurse did LVM requesting that the patient call her back to reschedule the appointment.
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
## 2019-02-13 NOTE — Patient Instructions (Addendum)
  Tests ordered today. Your results will be released to [place] (or called to you) after review.  If any changes need to be made, you will be notified at that same time.   Flu immunization administered today.     Medications reviewed and updated.  Changes include :  none   Your prescription(s) have been submitted to your pharmacy. Please take as directed and contact our office if you believe you are having problem(s) with the medication(s).    Please followup in 3 months

## 2019-02-15 ENCOUNTER — Ambulatory Visit (INDEPENDENT_AMBULATORY_CARE_PROVIDER_SITE_OTHER): Payer: Medicare Other | Admitting: Internal Medicine

## 2019-02-15 ENCOUNTER — Other Ambulatory Visit: Payer: Self-pay

## 2019-02-15 ENCOUNTER — Other Ambulatory Visit (INDEPENDENT_AMBULATORY_CARE_PROVIDER_SITE_OTHER): Payer: Medicare Other

## 2019-02-15 ENCOUNTER — Encounter: Payer: Self-pay | Admitting: Internal Medicine

## 2019-02-15 VITALS — BP 126/82 | Ht 62.0 in | Wt 216.0 lb

## 2019-02-15 DIAGNOSIS — F329 Major depressive disorder, single episode, unspecified: Secondary | ICD-10-CM

## 2019-02-15 DIAGNOSIS — K219 Gastro-esophageal reflux disease without esophagitis: Secondary | ICD-10-CM

## 2019-02-15 DIAGNOSIS — R5382 Chronic fatigue, unspecified: Secondary | ICD-10-CM

## 2019-02-15 DIAGNOSIS — R350 Frequency of micturition: Secondary | ICD-10-CM

## 2019-02-15 DIAGNOSIS — E538 Deficiency of other specified B group vitamins: Secondary | ICD-10-CM | POA: Diagnosis not present

## 2019-02-15 DIAGNOSIS — G629 Polyneuropathy, unspecified: Secondary | ICD-10-CM

## 2019-02-15 DIAGNOSIS — Z23 Encounter for immunization: Secondary | ICD-10-CM

## 2019-02-15 DIAGNOSIS — E119 Type 2 diabetes mellitus without complications: Secondary | ICD-10-CM

## 2019-02-15 DIAGNOSIS — F419 Anxiety disorder, unspecified: Secondary | ICD-10-CM

## 2019-02-15 DIAGNOSIS — G4733 Obstructive sleep apnea (adult) (pediatric): Secondary | ICD-10-CM

## 2019-02-15 LAB — COMPREHENSIVE METABOLIC PANEL
ALT: 24 U/L (ref 0–35)
AST: 17 U/L (ref 0–37)
Albumin: 4 g/dL (ref 3.5–5.2)
Alkaline Phosphatase: 49 U/L (ref 39–117)
BUN: 18 mg/dL (ref 6–23)
CO2: 26 mEq/L (ref 19–32)
Calcium: 9.6 mg/dL (ref 8.4–10.5)
Chloride: 101 mEq/L (ref 96–112)
Creatinine, Ser: 1.04 mg/dL (ref 0.40–1.20)
GFR: 54.39 mL/min — ABNORMAL LOW (ref >60.00)
Glucose, Bld: 309 mg/dL — ABNORMAL HIGH (ref 70–99)
Potassium: 4.3 mEq/L (ref 3.5–5.1)
Sodium: 136 mEq/L (ref 135–145)
Total Bilirubin: 0.6 mg/dL (ref 0.2–1.2)
Total Protein: 7.4 g/dL (ref 6.0–8.3)

## 2019-02-15 LAB — URINALYSIS, ROUTINE W REFLEX MICROSCOPIC
Bilirubin Urine: NEGATIVE
Hgb urine dipstick: NEGATIVE
Ketones, ur: NEGATIVE
Leukocytes,Ua: NEGATIVE
Nitrite: NEGATIVE
Specific Gravity, Urine: >=1.03 — AB (ref 1.000–1.030)
Total Protein, Urine: NEGATIVE
Urine Glucose: 250 — AB
Urobilinogen, UA: 0.2 (ref 0.0–1.0)
pH: 5 (ref 5.0–8.0)

## 2019-02-15 LAB — HEMOGLOBIN A1C: Hgb A1c MFr Bld: 10.1 % — ABNORMAL HIGH (ref 4.6–6.5)

## 2019-02-15 LAB — CBC WITH DIFFERENTIAL/PLATELET
Basophils Absolute: 0 10*3/uL (ref 0.0–0.1)
Basophils Relative: 0.3 % (ref 0.0–3.0)
Eosinophils Absolute: 0.4 10*3/uL (ref 0.0–0.7)
Eosinophils Relative: 3.6 % (ref 0.0–5.0)
HCT: 49.1 % — ABNORMAL HIGH (ref 36.0–46.0)
Hemoglobin: 16.2 g/dL — ABNORMAL HIGH (ref 12.0–15.0)
Lymphocytes Relative: 22.9 % (ref 12.0–46.0)
Lymphs Abs: 2.6 10*3/uL (ref 0.7–4.0)
MCHC: 33 g/dL (ref 30.0–36.0)
MCV: 92 fl (ref 78.0–100.0)
Monocytes Absolute: 0.6 10*3/uL (ref 0.1–1.0)
Monocytes Relative: 5.6 % (ref 3.0–12.0)
Neutro Abs: 7.7 10*3/uL (ref 1.4–7.7)
Neutrophils Relative %: 67.6 % (ref 43.0–77.0)
Platelets: 246 10*3/uL (ref 150.0–400.0)
RBC: 5.33 Mil/uL — ABNORMAL HIGH (ref 3.87–5.11)
RDW: 13.8 % (ref 11.5–15.5)
WBC: 11.4 10*3/uL — ABNORMAL HIGH (ref 4.0–10.5)

## 2019-02-15 LAB — LIPID PANEL
Cholesterol: 188 mg/dL (ref 0–200)
HDL: 35.4 mg/dL — ABNORMAL LOW (ref >39.00)
NonHDL: 152.52
Total CHOL/HDL Ratio: 5
Triglycerides: 288 mg/dL — ABNORMAL HIGH (ref 0.0–149.0)
VLDL: 57.6 mg/dL — ABNORMAL HIGH (ref 0.0–40.0)

## 2019-02-15 LAB — MICROALBUMIN / CREATININE URINE RATIO
Creatinine,U: 186.6 mg/dL
Microalb Creat Ratio: 1.2 mg/g (ref 0.0–30.0)
Microalb, Ur: 2.2 mg/dL — ABNORMAL HIGH (ref 0.0–1.9)

## 2019-02-15 LAB — LDL CHOLESTEROL, DIRECT: Direct LDL: 128 mg/dL

## 2019-02-15 MED ORDER — BLOOD GLUCOSE MONITOR KIT: PACK | 0 refills | Status: DC

## 2019-02-15 MED ORDER — OMEPRAZOLE 40 MG PO CPDR: 40.0000 mg | DELAYED_RELEASE_CAPSULE | Freq: Every day | ORAL | 3 refills | Status: DC

## 2019-02-15 NOTE — Assessment & Plan Note (Signed)
She is taking her medication daily GERD is controlled Continue daily medication

## 2019-02-15 NOTE — Assessment & Plan Note (Signed)
Polyuria and polydipsia-likely related to uncontrolled sugars Also need to rule out UTI-urinalysis, urine culture

## 2019-02-15 NOTE — Assessment & Plan Note (Signed)
She feels anxious and depressed, but is not taking her medication Stressed restarting the Cymbalta daily

## 2019-02-15 NOTE — Assessment & Plan Note (Signed)
Has never been on CPAP ?  Degree of OSA at this point May need to consider having her retested

## 2019-02-15 NOTE — Assessment & Plan Note (Addendum)
Not taking B12 Check level

## 2019-02-15 NOTE — Assessment & Plan Note (Signed)
1 her complaints today is fatigue She is not sleeping enough and her sleep is poor quality because of frequent urination-frequent urination possibly related to uncontrolled sugars Will check blood work and urine tests Fatigue may also be multifactorial Stressed taking her medication Stressed increasing exercise

## 2019-02-15 NOTE — Assessment & Plan Note (Signed)
Currently not taking any medication-just does not want to take the pills every day Having numbness/tingling and slight decreased sensation Stressed the importance of taking her medication on a regular basis Stressed getting her sugars better controlled

## 2019-02-15 NOTE — Assessment & Plan Note (Signed)
Unlikely controlled-she is experiencing polyuria and polydipsia, which is likely related to uncontrolled sugars Her sugars were not ideally controlled previously Continue current medications for now Check A1c, urine microalbumin We will adjust medications as needed Stressed compliance with a diabetic diet and regular exercise

## 2019-02-16 LAB — TSH: TSH: 1.01 u[IU]/mL (ref 0.35–4.50)

## 2019-02-16 LAB — VITAMIN B12: Vitamin B-12: 347 pg/mL (ref 211–911)

## 2019-02-17 LAB — URINE CULTURE
MICRO NUMBER:: 803367
SPECIMEN QUALITY:: ADEQUATE

## 2019-02-18 ENCOUNTER — Encounter: Payer: Self-pay | Admitting: Internal Medicine

## 2019-02-18 ENCOUNTER — Other Ambulatory Visit: Payer: Self-pay | Admitting: Internal Medicine

## 2019-02-19 ENCOUNTER — Telehealth: Payer: Self-pay

## 2019-02-19 MED ORDER — OZEMPIC (0.25 OR 0.5 MG/DOSE) 2 MG/1.5ML ~~LOC~~ SOPN: PEN_INJECTOR | SUBCUTANEOUS | 5 refills | Status: DC

## 2019-02-19 NOTE — Telephone Encounter (Signed)
Copied from [place] [id number]. Topic: General - Other >> Feb 18, 2019  3:46 PM Alanda Slim E wrote: Reason for CRM: Pt would like a call from nurse to discuss lab results/ please advise

## 2019-02-19 NOTE — Telephone Encounter (Signed)
Spoke with pt in regards to lab results  °

## 2019-03-02 ENCOUNTER — Other Ambulatory Visit: Payer: Self-pay | Admitting: Internal Medicine

## 2019-05-04 DIAGNOSIS — H524 Presbyopia: Secondary | ICD-10-CM | POA: Diagnosis not present

## 2019-05-04 DIAGNOSIS — E119 Type 2 diabetes mellitus without complications: Secondary | ICD-10-CM | POA: Diagnosis not present

## 2019-05-04 DIAGNOSIS — H25013 Cortical age-related cataract, bilateral: Secondary | ICD-10-CM | POA: Diagnosis not present

## 2019-05-04 LAB — HM DIABETES EYE EXAM

## 2019-05-04 NOTE — Patient Instructions (Addendum)
  Tests ordered today. Your results will be released to [facility] (or called to you) after review.  If any changes need to be made, you will be notified at that same time.   Medications reviewed and updated.  Changes include :   Start januvia (sugar), cymbalta ( depression, anxiety, neuropathy), atorvastatin ( cholesterol)  Your prescription(s) have been submitted to your pharmacy. Please take as directed and contact our office if you believe you are having problem(s) with the medication(s).    Please followup in 4 months
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
## 2019-05-05 ENCOUNTER — Ambulatory Visit (INDEPENDENT_AMBULATORY_CARE_PROVIDER_SITE_OTHER): Payer: Medicare Other | Admitting: Internal Medicine

## 2019-05-05 ENCOUNTER — Other Ambulatory Visit: Payer: Self-pay

## 2019-05-05 ENCOUNTER — Encounter: Payer: Self-pay | Admitting: Internal Medicine

## 2019-05-05 VITALS — BP 140/84 | HR 82 | Temp 98.2°F | Resp 16 | Ht 62.0 in | Wt 215.0 lb

## 2019-05-05 DIAGNOSIS — K219 Gastro-esophageal reflux disease without esophagitis: Secondary | ICD-10-CM | POA: Diagnosis not present

## 2019-05-05 DIAGNOSIS — E119 Type 2 diabetes mellitus without complications: Secondary | ICD-10-CM

## 2019-05-05 DIAGNOSIS — F419 Anxiety disorder, unspecified: Secondary | ICD-10-CM

## 2019-05-05 DIAGNOSIS — F329 Major depressive disorder, single episode, unspecified: Secondary | ICD-10-CM

## 2019-05-05 DIAGNOSIS — G629 Polyneuropathy, unspecified: Secondary | ICD-10-CM | POA: Diagnosis not present

## 2019-05-05 LAB — POCT GLYCOSYLATED HEMOGLOBIN (HGB A1C): Hemoglobin A1C: 8.3 % — AB (ref 4.0–5.6)

## 2019-05-05 MED ORDER — SITAGLIPTIN PHOSPHATE 100 MG PO TABS: 100.0000 mg | ORAL_TABLET | Freq: Every day | ORAL | 5 refills | Status: DC

## 2019-05-05 MED ORDER — METFORMIN HCL 500 MG PO TABS: ORAL_TABLET | ORAL | 1 refills | Status: DC

## 2019-05-05 MED ORDER — ATORVASTATIN CALCIUM 20 MG PO TABS: 20.0000 mg | ORAL_TABLET | Freq: Every day | ORAL | 1 refills | Status: DC

## 2019-05-05 MED ORDER — DULOXETINE HCL 30 MG PO CPEP: 30.0000 mg | ORAL_CAPSULE | Freq: Every day | ORAL | 5 refills | Status: DC

## 2019-05-05 NOTE — Assessment & Plan Note (Addendum)
Only taking metformin, no ozempic or januvia Sugars improving - recently 150-160's, but has been in 200's Exercising a little, diet improved A1c today Sugars are not likely controlled Continue metformin at current dose Start Januvia if not expensive

## 2019-05-05 NOTE — Assessment & Plan Note (Signed)
GERD controlled Continue daily medication  

## 2019-05-05 NOTE — Assessment & Plan Note (Signed)
Prescribe Cymbalta and she never tried it, but would like to try it now Start Cymbalta 30 mg daily Stressed getting better control of her sugars

## 2019-05-05 NOTE — Assessment & Plan Note (Signed)
Experiencing some depression and anxiety Has not started Cymbalta and I think she would benefit from that for her anxiety, depression and neuropathy Start Cymbalta 30 mg daily

## 2019-05-07 ENCOUNTER — Telehealth: Payer: Self-pay

## 2019-05-07 NOTE — Telephone Encounter (Signed)
Copied from [place] [id number]. Topic: General - Other >> May 06, 2019  2:34 PM Wynetta Emery, [place] C wrote: Reason for CRM: pt called in for her A1C results from last visit?

## 2019-05-07 NOTE — Telephone Encounter (Signed)
LVM letting pt know results. 

## 2019-05-10 ENCOUNTER — Other Ambulatory Visit: Payer: Self-pay | Admitting: Internal Medicine

## 2019-05-10 DIAGNOSIS — N63 Unspecified lump in unspecified breast: Secondary | ICD-10-CM

## 2019-05-10 NOTE — Telephone Encounter (Signed)
Patient is calling back to receive her lab results. Please advise. Thank you

## 2019-05-11 NOTE — Telephone Encounter (Signed)
Pt aware of results 

## 2019-05-14 ENCOUNTER — Encounter: Payer: Self-pay | Admitting: Internal Medicine

## 2019-05-18 ENCOUNTER — Ambulatory Visit: Payer: Medicare Other | Admitting: Internal Medicine

## 2019-05-27 ENCOUNTER — Encounter: Payer: Self-pay | Admitting: Internal Medicine

## 2019-06-01 ENCOUNTER — Ambulatory Visit (INDEPENDENT_AMBULATORY_CARE_PROVIDER_SITE_OTHER): Payer: Medicare Other | Admitting: Family

## 2019-06-01 ENCOUNTER — Encounter: Payer: Self-pay | Admitting: Family

## 2019-06-01 DIAGNOSIS — J209 Acute bronchitis, unspecified: Secondary | ICD-10-CM | POA: Diagnosis not present

## 2019-06-01 MED ORDER — BENZONATATE 200 MG PO CAPS: 200.0000 mg | ORAL_CAPSULE | Freq: Three times a day (TID) | ORAL | 0 refills | Status: DC | PRN

## 2019-06-01 MED ORDER — DOXYCYCLINE HYCLATE 100 MG PO TABS: 100.0000 mg | ORAL_TABLET | Freq: Two times a day (BID) | ORAL | 0 refills | Status: DC
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
## 2019-06-02 ENCOUNTER — Telehealth: Payer: Self-pay | Admitting: General Practice

## 2019-06-02 MED ORDER — DOXYCYCLINE HYCLATE 100 MG PO TABS: 100.0000 mg | ORAL_TABLET | Freq: Two times a day (BID) | ORAL | 0 refills | Status: DC

## 2019-06-02 MED ORDER — BENZONATATE 200 MG PO CAPS: 200.0000 mg | ORAL_CAPSULE | Freq: Three times a day (TID) | ORAL | 0 refills | Status: DC | PRN

## 2019-06-02 NOTE — Telephone Encounter (Signed)
Medications resent. Pt is aware.

## 2019-06-02 NOTE — Telephone Encounter (Signed)
Patient had a virutal with murray yesterdat 12/8. Patient states pharmacy does not have medication prescriptions doxycycline (VIBRA-TABS) 100 MG tablet  benzonatate (TESSALON) 200 MG capsule  PHARMACY [id number] - [place], [place] [address] [phone] (Phone) [phone] (Fax)     Patient call back [phone]

## 2019-06-08 ENCOUNTER — Other Ambulatory Visit: Payer: Self-pay

## 2019-06-08 ENCOUNTER — Ambulatory Visit
Admission: RE | Admit: 2019-06-08 | Discharge: 2019-06-08 | Disposition: A | Discharge status: Home / Self Care | Payer: Medicare Other | Source: Ambulatory Visit | Attending: Internal Medicine | Admitting: Internal Medicine

## 2019-06-08 DIAGNOSIS — R928 Other abnormal and inconclusive findings on diagnostic imaging of breast: Secondary | ICD-10-CM | POA: Diagnosis not present

## 2019-06-08 DIAGNOSIS — N63 Unspecified lump in unspecified breast: Secondary | ICD-10-CM

## 2019-06-08 DIAGNOSIS — N6320 Unspecified lump in the left breast, unspecified quadrant: Secondary | ICD-10-CM | POA: Diagnosis not present

## 2019-06-09 ENCOUNTER — Encounter: Payer: Self-pay | Admitting: Internal Medicine

## 2019-06-09 ENCOUNTER — Encounter: Payer: Self-pay | Admitting: Family

## 2019-09-02 DIAGNOSIS — E1169 Type 2 diabetes mellitus with other specified complication: Secondary | ICD-10-CM | POA: Insufficient documentation

## 2019-09-02 DIAGNOSIS — E785 Hyperlipidemia, unspecified: Secondary | ICD-10-CM | POA: Insufficient documentation

## 2019-09-02 NOTE — Patient Instructions (Addendum)
Make an appointment with sports medicine.   Blood work was ordered.     Medications reviewed and updated.  Changes include :   Increase lyrica to 75 mg twice daily.  Increase Cymbalta to 60 mg daily.   Your prescription(s) have been submitted to your pharmacy. Please take as directed and contact our office if you believe you are having problem(s) with the medication(s).     Please followup in 3 months
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
## 2019-09-03 ENCOUNTER — Other Ambulatory Visit: Payer: Self-pay

## 2019-09-03 ENCOUNTER — Ambulatory Visit (INDEPENDENT_AMBULATORY_CARE_PROVIDER_SITE_OTHER): Payer: Medicare Other | Admitting: Internal Medicine

## 2019-09-03 ENCOUNTER — Encounter: Payer: Self-pay | Admitting: Internal Medicine

## 2019-09-03 VITALS — BP 116/74 | HR 60 | Temp 98.1°F | Resp 16 | Ht 62.0 in | Wt 200.6 lb

## 2019-09-03 DIAGNOSIS — G629 Polyneuropathy, unspecified: Secondary | ICD-10-CM

## 2019-09-03 DIAGNOSIS — E782 Mixed hyperlipidemia: Secondary | ICD-10-CM | POA: Diagnosis not present

## 2019-09-03 DIAGNOSIS — F419 Anxiety disorder, unspecified: Secondary | ICD-10-CM

## 2019-09-03 DIAGNOSIS — K219 Gastro-esophageal reflux disease without esophagitis: Secondary | ICD-10-CM

## 2019-09-03 DIAGNOSIS — E119 Type 2 diabetes mellitus without complications: Secondary | ICD-10-CM

## 2019-09-03 DIAGNOSIS — M25551 Pain in right hip: Secondary | ICD-10-CM | POA: Insufficient documentation

## 2019-09-03 DIAGNOSIS — F329 Major depressive disorder, single episode, unspecified: Secondary | ICD-10-CM

## 2019-09-03 LAB — COMPREHENSIVE METABOLIC PANEL
ALT: 41 U/L — ABNORMAL HIGH (ref 0–35)
AST: 31 U/L (ref 0–37)
Albumin: 3.6 g/dL (ref 3.5–5.2)
Alkaline Phosphatase: 45 U/L (ref 39–117)
BUN: 19 mg/dL (ref 6–23)
CO2: 28 mEq/L (ref 19–32)
Calcium: 9.4 mg/dL (ref 8.4–10.5)
Chloride: 104 mEq/L (ref 96–112)
Creatinine, Ser: 1.01 mg/dL (ref 0.40–1.20)
GFR: 56.15 mL/min — ABNORMAL LOW (ref >60.00)
Glucose, Bld: 189 mg/dL — ABNORMAL HIGH (ref 70–99)
Potassium: 4.2 mEq/L (ref 3.5–5.1)
Sodium: 138 mEq/L (ref 135–145)
Total Bilirubin: 0.6 mg/dL (ref 0.2–1.2)
Total Protein: 7 g/dL (ref 6.0–8.3)

## 2019-09-03 LAB — HEMOGLOBIN A1C: Hgb A1c MFr Bld: 8.9 % — ABNORMAL HIGH (ref 4.6–6.5)

## 2019-09-03 MED ORDER — PREGABALIN 75 MG PO CAPS: 75.0000 mg | ORAL_CAPSULE | Freq: Two times a day (BID) | ORAL | 0 refills | Status: DC

## 2019-09-03 MED ORDER — DULOXETINE HCL 60 MG PO CPEP: 60.0000 mg | ORAL_CAPSULE | Freq: Every day | ORAL | 1 refills | Status: DC

## 2019-09-03 NOTE — Assessment & Plan Note (Signed)
Experiencing intermittent right hip pain and she states the right hip gives out on her resulting in her falling Will refer to sports medicine for further evaluation

## 2019-09-03 NOTE — Assessment & Plan Note (Signed)
Chronic Lipids well controlled Continue daily statin Regular exercise and healthy diet encouraged  

## 2019-09-03 NOTE — Assessment & Plan Note (Signed)
Chronic Increased pain Taking Cymbalta 30 mg daily-increase to 60 mg daily Taking Lyrica 50 mg twice daily-increase to 75 mg twice daily Follow-up in 3 months

## 2019-09-03 NOTE — Assessment & Plan Note (Signed)
Chronic Check A1c today Taking medication as prescribed Feels she is eating well Encouraged regular exercise Has lost weight We will adjust medication if necessary

## 2019-09-03 NOTE — Assessment & Plan Note (Signed)
Chronic GERD controlled Continue daily medication - omeprazole 40 mg daily  

## 2019-09-03 NOTE — Assessment & Plan Note (Signed)
Chronic Anxiety and depression are not ideally controlled We will try increasing Cymbalta to 60 mg daily-advised her that if this does not help we should consider a different medication, which may be more effective Follow-up in 3 months, sooner if needed

## 2019-09-06 ENCOUNTER — Other Ambulatory Visit: Payer: Self-pay

## 2019-09-06 ENCOUNTER — Encounter: Payer: Self-pay | Admitting: Family Medicine

## 2019-09-06 ENCOUNTER — Ambulatory Visit (INDEPENDENT_AMBULATORY_CARE_PROVIDER_SITE_OTHER): Payer: Medicare Other | Admitting: Family Medicine

## 2019-09-06 VITALS — BP 120/82 | HR 72 | Ht 62.0 in | Wt 208.0 lb

## 2019-09-06 DIAGNOSIS — M7061 Trochanteric bursitis, right hip: Secondary | ICD-10-CM

## 2019-09-06 NOTE — Patient Instructions (Addendum)
Thank you for coming in today. I think you have Trochanteric Bursitis or Tendonitis.  Attend PT.  Do some of the home exercise we reviewed.  Try using voltaren gel over the counter on the hip up to 4x daily. Recheck with me in about 1 month.  Let me know or return sooner if not doing well.   Please perform the exercise program that we have prepared for you and gone over in detail on a daily basis.  In addition to the handout you were provided you can access your program through: [email or web address]   Your unique program code is: [email or web address]     Hip Bursitis  Hip bursitis is swelling of a fluid-filled sac (bursa) in your hip joint. This swelling (inflammation) can be painful. This condition may come and go over time. What are the causes?  Injury to the hip.  Overuse of the muscles that surround the hip joint.  An earlier injury or surgery of the hip.  Arthritis or gout.  Diabetes.  Thyroid disease.  Infection.  In some cases, the cause may not be known. What are the signs or symptoms?  Mild or moderate pain in the hip area. Pain may get worse with movement.  Tenderness and swelling of the hip, especially on the outer side of the hip.  In rare cases, the bursa may become infected. This may cause: ? A fever. ? Warmth and redness in the area. Symptoms may come and go. How is this treated? This condition is treated by resting, icing, applying pressure (compression), and raising (elevating) the injured area. You may hear this called the RICE treatment. Treatment may also include:  Using crutches.  Draining fluid out of the bursa to help relieve swelling.  Giving a shot of (injecting) medicine that helps to reduce swelling (cortisone).  Other medicines if the bursa is infected. Follow these instructions at home: Managing pain, stiffness, and swelling   If told, put ice on the painful area. ? Put ice in a plastic bag. ? Place a towel between your skin and the  bag. ? Leave the ice on for 20 minutes, 2-3 times a day. ? Raise (elevate) your hip above the level of your heart as much as you can without pain. To do this, try putting a pillow under your hips while you lie down. Stop if this causes pain. Activity  Return to your normal activities as told by your doctor. Ask your doctor what activities are safe for you.  Rest and protect your hip as much as you can until you feel better. General instructions  Take over-the-counter and prescription medicines only as told by your doctor.  Wear wraps that put pressure on your hip (compression wraps) only as told by your doctor.  Do not use your hip to support your body weight until your doctor says that you can.  Use crutches as told by your doctor.  Gently rub and stretch your injured area as often as is comfortable.  Keep all follow-up visits as told by your doctor. This is important. How is this prevented?  Exercise regularly, as told by your doctor.  Warm up and stretch before being active.  Cool down and stretch after being active.  Avoid activities that bother your hip or cause pain.  Avoid sitting down for long periods at a time. Contact a doctor if:  You have a fever.  You get new symptoms.  You have trouble walking.  You have trouble doing everyday activities.  You  have pain that gets worse.  You have pain that does not get better with medicine.  You get red skin on your hip area.  You get a feeling of warmth in your hip area. Get help right away if:  You cannot move your hip.  You have very bad pain. Summary  Hip bursitis is swelling of a fluid-filled sac (bursa) in your hip.  Hip bursitis can be painful.  Symptoms often come and go over time.  This condition is treated with rest, ice, compression, elevation, and medicines. This information is not intended to replace advice given to you by your health care provider. Make sure you discuss any questions you have  with your health care provider. Document Revised: 02/16/2018 Document Reviewed: 02/16/2018 Elsevier Patient Education  2020 [employer].

## 2019-09-06 NOTE — Progress Notes (Signed)
    Subjective:    CC: R hip/thigh pain  I, Debbe Odea, am serving as a Neurosurgeon for Dr. Clementeen Graham.  HPI: Pt is a 59 y/o female presenting w/ c/o R hip and thigh pain w/ R LE giving-way.  She has had several falls due to the R LE giving way secondary to the R hip pain.  She rates her pain at a 7.5/10 and describes her pain as constant dull aching. She denies any injury.  She is not fallen and landed on her hip.  No radiating pain weakness or numbness.  Radiating pain: Yes Low back pain: no R LE numbness/tingling: no R LE weakness: only when she feels the leg going out Aggravating factors: at night too much walking Treatments tried: ibuprofen   Pertinent review of Systems: No fevers or chills  Relevant historical information: History anxiety and depression and neuropathy.  History of diabetes.   Objective:    Vitals:   09/06/19 1424  BP: 120/82  Pulse: 72  SpO2: 96%   General: Well Developed, well nourished, and in no acute distress.   MSK:  Right hip normal-appearing Normal motion. Tender palpation overlying greater trochanter. Hip abduction strength diminished 3+/5.  Hip external rotation strength diminished 4/5.  Abduction external rotation resistance generates pain. Internal rotation and adduction strength 5/5 without pain. Pulses cap refill and sensation are intact distally.     Impression and Recommendations:    Assessment and Plan: 59 y.o. female with right lateral hip pain.  Trochanteric bursitis/hip abductor tendinopathy.  Plan for home exercise program as taught by ATC in clinic.  Voltaren gel and referral to physical therapy.  Recheck back in 1 month.  Return sooner if needed.  Precautions reviewed.Marland Kitchen   PDMP not reviewed this encounter. Orders Placed This Encounter  Procedures  . Ambulatory referral to Physical Therapy    Referral Priority:   Routine    Referral Type:   Physical Medicine    Referral Reason:   Specialty Services Required   Requested Specialty:   Physical Therapy   No orders of the defined types were placed in this encounter.   Discussed warning signs or symptoms. Please see discharge instructions. Patient expresses understanding.   The above documentation has been reviewed and is accurate and complete Clementeen Graham

## 2019-09-07 ENCOUNTER — Other Ambulatory Visit: Payer: Self-pay

## 2019-09-07 MED ORDER — OMEPRAZOLE 40 MG PO CPDR: 40.0000 mg | DELAYED_RELEASE_CAPSULE | Freq: Every day | ORAL | 1 refills | Status: DC

## 2019-09-07 MED ORDER — METFORMIN HCL 500 MG PO TABS: ORAL_TABLET | ORAL | 1 refills | Status: DC

## 2019-09-07 MED ORDER — SITAGLIPTIN PHOSPHATE 100 MG PO TABS: 100.0000 mg | ORAL_TABLET | Freq: Every day | ORAL | 1 refills | Status: DC

## 2019-09-07 MED ORDER — GLIMEPIRIDE 2 MG PO TABS: 2.0000 mg | ORAL_TABLET | Freq: Every day | ORAL | 3 refills | Status: DC

## 2019-09-27 ENCOUNTER — Ambulatory Visit: Payer: Medicare Other | Attending: Family Medicine | Admitting: Physical Therapy

## 2019-10-05 ENCOUNTER — Ambulatory Visit (INDEPENDENT_AMBULATORY_CARE_PROVIDER_SITE_OTHER): Payer: Medicare Other | Admitting: Family Medicine

## 2019-10-05 DIAGNOSIS — Z5329 Procedure and treatment not carried out because of patient's decision for other reasons: Secondary | ICD-10-CM

## 2019-10-05 NOTE — Progress Notes (Signed)
No SHOW
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
## 2019-12-05 NOTE — Patient Instructions (Addendum)
°  Blood work was ordered.     Medications reviewed and updated.  Changes include :   Start wellbutrin 150 mg daily in the morning.  Restart the atorvastatin for your cholesterol.    Your prescription(s) have been submitted to your pharmacy. Please take as directed and contact our office if you believe you are having problem(s) with the medication(s).     Please followup in 3 months

## 2019-12-06 ENCOUNTER — Other Ambulatory Visit: Payer: Self-pay

## 2019-12-06 ENCOUNTER — Encounter: Payer: Self-pay | Admitting: Internal Medicine

## 2019-12-06 ENCOUNTER — Ambulatory Visit (INDEPENDENT_AMBULATORY_CARE_PROVIDER_SITE_OTHER): Payer: Medicare Other | Admitting: Internal Medicine

## 2019-12-06 VITALS — BP 126/80 | HR 71 | Temp 98.3°F | Ht 62.0 in | Wt 213.6 lb

## 2019-12-06 DIAGNOSIS — F329 Major depressive disorder, single episode, unspecified: Secondary | ICD-10-CM

## 2019-12-06 DIAGNOSIS — K219 Gastro-esophageal reflux disease without esophagitis: Secondary | ICD-10-CM

## 2019-12-06 DIAGNOSIS — E119 Type 2 diabetes mellitus without complications: Secondary | ICD-10-CM | POA: Diagnosis not present

## 2019-12-06 DIAGNOSIS — E782 Mixed hyperlipidemia: Secondary | ICD-10-CM

## 2019-12-06 DIAGNOSIS — F419 Anxiety disorder, unspecified: Secondary | ICD-10-CM

## 2019-12-06 DIAGNOSIS — G629 Polyneuropathy, unspecified: Secondary | ICD-10-CM | POA: Diagnosis not present

## 2019-12-06 MED ORDER — BLOOD GLUCOSE MONITOR KIT: PACK | 0 refills | Status: DC

## 2019-12-06 MED ORDER — ATORVASTATIN CALCIUM 20 MG PO TABS: 20.0000 mg | ORAL_TABLET | Freq: Every day | ORAL | 1 refills | Status: DC

## 2019-12-06 MED ORDER — BUPROPION HCL ER (XL) 150 MG PO TB24: 150.0000 mg | ORAL_TABLET | Freq: Every day | ORAL | 5 refills | Status: DC

## 2019-12-06 MED ORDER — OMEPRAZOLE 40 MG PO CPDR: 40.0000 mg | DELAYED_RELEASE_CAPSULE | Freq: Every day | ORAL | 1 refills | Status: DC

## 2019-12-06 NOTE — Assessment & Plan Note (Addendum)
Chronic improved-fairly controlled Burning and numbness in toes - better Continue cymbalta and lyrica at current dose

## 2019-12-06 NOTE — Assessment & Plan Note (Signed)
Chronic Not taking statin -- reordered  - will restart Lipids, cmp Work on weight loss

## 2019-12-06 NOTE — Assessment & Plan Note (Signed)
Chronic Continue metformin, glimepiride Check a1c Low sugar / carb diet Stressed regular exercise
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
## 2019-12-06 NOTE — Assessment & Plan Note (Signed)
Chronic GERD controlled Continue daily medication Omeprazole 40 mg daily 

## 2019-12-14 ENCOUNTER — Other Ambulatory Visit: Payer: Medicare Other

## 2020-01-08 ENCOUNTER — Other Ambulatory Visit: Payer: Self-pay | Admitting: Internal Medicine

## 2020-01-30 ENCOUNTER — Other Ambulatory Visit: Payer: Self-pay | Admitting: Internal Medicine

## 2020-02-25 ENCOUNTER — Telehealth: Payer: Self-pay | Admitting: Internal Medicine

## 2020-02-25 NOTE — Progress Notes (Signed)
°  Chronic Care Management   Note  02/25/2020 Name: NYONNA HARGROVE MRN: [id number] DOB: [birth year]  KOYA HUNGER is a 59 y.o. year old female who is a primary care patient of Burns, Bobette Mo, MD. I reached out to [name] by phone today in response to a referral sent by Ms. Leeroy Bock Lafontaine's PCP, Pincus Sanes, MD.   Ms. Westerlund was given information about Chronic Care Management services today including:  1. CCM service includes personalized support from designated clinical staff supervised by her physician, including individualized plan of care and coordination with other care providers 2. 24/7 contact phone numbers for assistance for urgent and routine care needs. 3. Service will only be billed when office clinical staff spend 20 minutes or more in a month to coordinate care. 4. Only one practitioner may furnish and bill the service in a calendar month. 5. The patient may stop CCM services at any time (effective at the end of the month) by phone call to the office staff.   Patient agreed to services and verbal consent obtained.   Follow up plan:   Lynnae January [employer] Scheduler
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
## 2020-03-06 NOTE — Patient Instructions (Signed)
  Blood work was ordered.     Medications reviewed and updated.  Changes include :     Your prescription(s) have been submitted to your pharmacy. Please take as directed and contact our office if you believe you are having problem(s) with the medication(s).  A referral was ordered for        Someone from their office will call you to schedule an appointment.    Please followup in 6 months   

## 2020-03-07 ENCOUNTER — Encounter: Payer: Medicare Other | Admitting: Internal Medicine

## 2020-03-07 DIAGNOSIS — Z0289 Encounter for other administrative examinations: Secondary | ICD-10-CM

## 2020-04-18 ENCOUNTER — Other Ambulatory Visit: Payer: Self-pay | Admitting: Internal Medicine

## 2020-05-03 ENCOUNTER — Telehealth: Payer: Self-pay | Admitting: Pharmacist
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
## 2020-05-04 ENCOUNTER — Telehealth: Payer: Medicare Other
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
## 2020-06-22 ENCOUNTER — Ambulatory Visit: Payer: Medicare Other
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
## 2020-07-21 ENCOUNTER — Telehealth: Payer: Self-pay | Admitting: Internal Medicine

## 2020-07-21 ENCOUNTER — Other Ambulatory Visit: Payer: Self-pay

## 2020-07-21 ENCOUNTER — Encounter: Payer: Self-pay | Admitting: Internal Medicine

## 2020-07-21 ENCOUNTER — Ambulatory Visit (INDEPENDENT_AMBULATORY_CARE_PROVIDER_SITE_OTHER): Payer: Medicare (Managed Care) | Admitting: Internal Medicine

## 2020-07-21 VITALS — BP 120/82 | HR 91 | Temp 98.0°F | Wt 210.6 lb

## 2020-07-21 DIAGNOSIS — E782 Mixed hyperlipidemia: Secondary | ICD-10-CM

## 2020-07-21 DIAGNOSIS — R0789 Other chest pain: Secondary | ICD-10-CM | POA: Insufficient documentation

## 2020-07-21 DIAGNOSIS — E1142 Type 2 diabetes mellitus with diabetic polyneuropathy: Secondary | ICD-10-CM

## 2020-07-21 DIAGNOSIS — Z23 Encounter for immunization: Secondary | ICD-10-CM

## 2020-07-21 DIAGNOSIS — F419 Anxiety disorder, unspecified: Secondary | ICD-10-CM

## 2020-07-21 DIAGNOSIS — G629 Polyneuropathy, unspecified: Secondary | ICD-10-CM | POA: Diagnosis not present

## 2020-07-21 DIAGNOSIS — M25561 Pain in right knee: Secondary | ICD-10-CM

## 2020-07-21 DIAGNOSIS — F32A Depression, unspecified: Secondary | ICD-10-CM

## 2020-07-21 DIAGNOSIS — E538 Deficiency of other specified B group vitamins: Secondary | ICD-10-CM

## 2020-07-21 DIAGNOSIS — D751 Secondary polycythemia: Secondary | ICD-10-CM | POA: Diagnosis not present

## 2020-07-21 DIAGNOSIS — R0781 Pleurodynia: Secondary | ICD-10-CM

## 2020-07-21 DIAGNOSIS — E119 Type 2 diabetes mellitus without complications: Secondary | ICD-10-CM

## 2020-07-21 LAB — COMPREHENSIVE METABOLIC PANEL
ALT: 37 U/L — ABNORMAL HIGH (ref 0–35)
AST: 21 U/L (ref 0–37)
Albumin: 3.9 g/dL (ref 3.5–5.2)
Alkaline Phosphatase: 51 U/L (ref 39–117)
BUN: 16 mg/dL (ref 6–23)
CO2: 29 mEq/L (ref 19–32)
Calcium: 9.9 mg/dL (ref 8.4–10.5)
Chloride: 98 mEq/L (ref 96–112)
Creatinine, Ser: 1.06 mg/dL (ref 0.40–1.20)
GFR: 57.45 mL/min — ABNORMAL LOW (ref >60.00)
Glucose, Bld: 420 mg/dL — ABNORMAL HIGH (ref 70–99)
Potassium: 4.5 mEq/L (ref 3.5–5.1)
Sodium: 134 mEq/L — ABNORMAL LOW (ref 135–145)
Total Bilirubin: 1.2 mg/dL (ref 0.2–1.2)
Total Protein: 7.6 g/dL (ref 6.0–8.3)

## 2020-07-21 LAB — HEMOGLOBIN A1C: Hgb A1c MFr Bld: 13.8 % — ABNORMAL HIGH (ref 4.6–6.5)

## 2020-07-21 LAB — CBC WITH DIFFERENTIAL/PLATELET
Basophils Absolute: 0.1 10*3/uL (ref 0.0–0.1)
Basophils Relative: 0.9 % (ref 0.0–3.0)
Eosinophils Absolute: 0.5 10*3/uL (ref 0.0–0.7)
Eosinophils Relative: 4.8 % (ref 0.0–5.0)
HCT: 51 % — ABNORMAL HIGH (ref 36.0–46.0)
Hemoglobin: 16.7 g/dL — ABNORMAL HIGH (ref 12.0–15.0)
Lymphocytes Relative: 26 % (ref 12.0–46.0)
Lymphs Abs: 2.7 10*3/uL (ref 0.7–4.0)
MCHC: 32.6 g/dL (ref 30.0–36.0)
MCV: 91.2 fl (ref 78.0–100.0)
Monocytes Absolute: 0.9 10*3/uL (ref 0.1–1.0)
Monocytes Relative: 8.5 % (ref 3.0–12.0)
Neutro Abs: 6.2 10*3/uL (ref 1.4–7.7)
Neutrophils Relative %: 59.8 % (ref 43.0–77.0)
Platelets: 238 10*3/uL (ref 150.0–400.0)
RBC: 5.6 Mil/uL — ABNORMAL HIGH (ref 3.87–5.11)
RDW: 13.7 % (ref 11.5–15.5)
WBC: 10.3 10*3/uL (ref 4.0–10.5)

## 2020-07-21 LAB — LIPID PANEL
Cholesterol: 158 mg/dL (ref 0–200)
HDL: 33 mg/dL — ABNORMAL LOW (ref >39.00)
NonHDL: 124.96
Total CHOL/HDL Ratio: 5
Triglycerides: 297 mg/dL — ABNORMAL HIGH (ref 0.0–149.0)
VLDL: 59.4 mg/dL — ABNORMAL HIGH (ref 0.0–40.0)

## 2020-07-21 LAB — VITAMIN B12: Vitamin B-12: 488 pg/mL (ref 211–911)

## 2020-07-21 LAB — LDL CHOLESTEROL, DIRECT: Direct LDL: 98 mg/dL

## 2020-07-21 MED ORDER — PREGABALIN 75 MG PO CAPS: 75.0000 mg | ORAL_CAPSULE | Freq: Two times a day (BID) | ORAL | 0 refills | Status: DC

## 2020-07-21 MED ORDER — BUPROPION HCL ER (XL) 150 MG PO TB24: 150.0000 mg | ORAL_TABLET | Freq: Every day | ORAL | 5 refills | Status: DC

## 2020-07-21 MED ORDER — TIZANIDINE HCL 4 MG PO TABS: 4.0000 mg | ORAL_TABLET | Freq: Four times a day (QID) | ORAL | 0 refills | Status: DC | PRN

## 2020-07-21 MED ORDER — RYBELSUS 3 MG PO TABS: 3.0000 mg | ORAL_TABLET | Freq: Every day | ORAL | 2 refills | Status: DC

## 2020-07-21 MED ORDER — LORATADINE 10 MG PO TABS: 10.0000 mg | ORAL_TABLET | Freq: Every day | ORAL | 1 refills | Status: DC

## 2020-07-21 MED ORDER — METFORMIN HCL 500 MG PO TABS: ORAL_TABLET | ORAL | 1 refills | Status: DC

## 2020-07-21 MED ORDER — GLIMEPIRIDE 2 MG PO TABS: 2.0000 mg | ORAL_TABLET | Freq: Every day | ORAL | 3 refills | Status: DC

## 2020-07-21 MED ORDER — OMEPRAZOLE 40 MG PO CPDR: 40.0000 mg | DELAYED_RELEASE_CAPSULE | Freq: Every day | ORAL | 1 refills | Status: DC

## 2020-07-21 MED ORDER — DULOXETINE HCL 60 MG PO CPEP: 60.0000 mg | ORAL_CAPSULE | Freq: Every day | ORAL | 1 refills | Status: DC

## 2020-07-21 NOTE — Assessment & Plan Note (Signed)
Acute Started 1 week ago and has improved Musculoskeletal in nature Improved with NSAIDs and muscle relaxer-okay to continue Tizanidine 4 mg every 6 hours as needed

## 2020-07-21 NOTE — Addendum Note (Signed)
Addended by: Karma Ganja on: 07/21/2020 01:36 PM   Modules accepted: Orders

## 2020-07-21 NOTE — Assessment & Plan Note (Signed)
Chronic Continue cymbalta 60 mg daily and Lyrica 75 mg twice daily Stressed good sugar control
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
## 2020-07-21 NOTE — Assessment & Plan Note (Signed)
Chronic CBC Has not wanted to consider sleep apnea testing

## 2020-07-21 NOTE — Assessment & Plan Note (Signed)
Chronic Check lipid panel  Continue atorvastatin 20 mg daily Regular exercise and healthy diet encouraged  

## 2020-07-21 NOTE — Assessment & Plan Note (Signed)
Chronic Has not been taking [place] Continue glimepiride 2 mg daily, metformin 1000 mg BID Stop januvia Start Rybelsus 3  mg - will increase after one month Stressed exercise, weight loss

## 2020-07-21 NOTE — Assessment & Plan Note (Signed)
Chronic ?Check B12 level ?

## 2020-07-21 NOTE — Telephone Encounter (Signed)
Patient requesting refill for: Accu Chek guide test strips buPROPion (WELLBUTRIN XL) 150 MG 24 hr tablet DULoxetine (CYMBALTA) 60 MG capsule glimepiride (AMARYL) 2 MG tablet loratadine (CLARITIN) 10 MG tablet metFORMIN (GLUCOPHAGE) 500 MG tablet omeprazole (PRILOSEC) 40 MG capsule pregabalin (LYRICA) 75 MG capsule   Pharmacy [id number] - [place], [place] - [address]

## 2020-07-21 NOTE — Addendum Note (Signed)
Addended by: Pincus Sanes on: 07/21/2020 04:47 PM   Modules accepted: Orders

## 2020-07-21 NOTE — Patient Instructions (Addendum)
  Blood work was ordered.     Medications changes include :   Tizanidine 4 mg - muscle spasm.   rybelsus for diabetes.   Your prescription(s) have been submitted to your pharmacy. Please take as directed and contact our office if you believe you are having problem(s) with the medication(s).     Please followup in 3 months

## 2020-07-21 NOTE — Assessment & Plan Note (Signed)
Acute Posterior lateral knee burning on 2 occasions-she noticed this when she was getting in the bed-thought it was the gain Nontender on exam Advised her to monitor for now and let me know if this continues or worsens

## 2020-07-25 MED ORDER — GLIPIZIDE 10 MG PO TABS: 10.0000 mg | ORAL_TABLET | Freq: Two times a day (BID) | ORAL | 3 refills | Status: DC

## 2020-07-25 NOTE — Addendum Note (Signed)
Addended by: Pincus Sanes on: 07/25/2020 06:06 AM   Modules accepted: Orders

## 2020-07-26 ENCOUNTER — Other Ambulatory Visit: Payer: Self-pay | Admitting: Internal Medicine

## 2020-07-26 ENCOUNTER — Other Ambulatory Visit: Payer: Self-pay

## 2020-07-26 MED ORDER — ACCU-CHEK GUIDE VI STRP: ORAL_STRIP | 2 refills | Status: DC

## 2020-07-26 MED ORDER — ONETOUCH ULTRASOFT LANCETS MISC: 2 refills | Status: DC

## 2020-07-26 MED ORDER — ONETOUCH ULTRA VI STRP: ORAL_STRIP | 2 refills | Status: DC

## 2020-07-26 MED ORDER — LANCETS MICRO THIN 33G MISC: 5 refills | Status: DC

## 2020-07-26 MED ORDER — BLOOD GLUCOSE MONITOR KIT: PACK | 0 refills | Status: DC

## 2020-07-26 NOTE — Addendum Note (Signed)
Addended by: Karma Ganja on: 07/26/2020 04:19 PM   Modules accepted: Orders

## 2020-08-21 ENCOUNTER — Other Ambulatory Visit: Payer: Self-pay

## 2020-08-21 ENCOUNTER — Ambulatory Visit (INDEPENDENT_AMBULATORY_CARE_PROVIDER_SITE_OTHER): Payer: Medicare (Managed Care)

## 2020-08-21 DIAGNOSIS — Z0000 Encounter for general adult medical examination without abnormal findings: Secondary | ICD-10-CM | POA: Diagnosis not present

## 2020-08-21 MED ORDER — ATORVASTATIN CALCIUM 20 MG PO TABS: 20.0000 mg | ORAL_TABLET | Freq: Every day | ORAL | 1 refills | Status: DC

## 2020-08-21 MED ORDER — PREGABALIN 75 MG PO CAPS: 75.0000 mg | ORAL_CAPSULE | Freq: Two times a day (BID) | ORAL | 0 refills | Status: DC

## 2020-08-21 MED ORDER — BLOOD GLUCOSE MONITOR KIT: PACK | 0 refills | Status: DC

## 2020-08-21 NOTE — Patient Instructions (Signed)
Monica Welch , Thank you for taking time to come for your Medicare Wellness Visit. I appreciate your ongoing commitment to your health goals. Please review the following plan we discussed and let me know if I can assist you in the future.   Screening recommendations/referrals: Colonoscopy: 03/13/2018; due every 3 years (Cologuard) Mammogram: 06/08/2019; [facility]  Bone Density: never done Recommended yearly ophthalmology/optometry visit for glaucoma screening and checkup Recommended yearly dental visit for hygiene and checkup  Vaccinations: Influenza vaccine: 07/21/2020 Pneumococcal vaccine: 07/29/2016 Tdap vaccine: 02/27/2018 Shingles vaccine: declined  Covid-19: declined  Advanced directives: Please bring a copy of your health care power of attorney and living will to the office at your convenience.  Conditions/risks identified: Yes; Reviewed health maintenance screenings with patient today and relevant education, vaccines, and/or referrals were provided. Please continue to do your personal lifestyle choices by: daily care of teeth and gums, regular physical activity (goal should be 5 days a week for 30 minutes), eat a healthy diet, avoid tobacco and drug use, limiting any alcohol intake, taking a low-dose aspirin (if not allergic or have been advised by your provider otherwise) and taking vitamins and minerals as recommended by your provider. Continue doing brain stimulating activities (puzzles, reading, adult coloring books, staying active) to keep memory sharp. Continue to eat heart healthy diet (full of fruits, vegetables, whole grains, lean protein, water--limit salt, fat, and sugar intake) and increase physical activity as tolerated.  Next appointment: Please schedule your next Medicare Wellness Visit with your Nurse Health Advisor in 1 year by calling [phone].   Preventive Care 40-64 Years, Female Preventive care refers to lifestyle choices and visits with your  health care provider that can promote health and wellness. What does preventive care include?  A yearly physical exam. This is also called an annual well check.  Dental exams once or twice a year.  Routine eye exams. Ask your health care provider how often you should have your eyes checked.  Personal lifestyle choices, including:  Daily care of your teeth and gums.  Regular physical activity.  Eating a healthy diet.  Avoiding tobacco and drug use.  Limiting alcohol use.  Practicing safe sex.  Taking low-dose aspirin daily starting at age 71.  Taking vitamin and mineral supplements as recommended by your health care provider. What happens during an annual well check? The services and screenings done by your health care provider during your annual well check will depend on your age, overall health, lifestyle risk factors, and family history of disease. Counseling  Your health care provider may ask you questions about your:  Alcohol use.  Tobacco use.  Drug use.  Emotional well-being.  Home and relationship well-being.  Sexual activity.  Eating habits.  Work and work Statistician.  Method of birth control.  Menstrual cycle.  Pregnancy history. Screening  You may have the following tests or measurements:  Height, weight, and BMI.  Blood pressure.  Lipid and cholesterol levels. These may be checked every 5 years, or more frequently if you are over 72 years old.  Skin check.  Lung cancer screening. You may have this screening every year starting at age 74 if you have a 30-pack-year history of smoking and currently smoke or have quit within the past 15 years.  Fecal occult blood test (FOBT) of the stool. You may have this test every year starting at age 3.  Flexible sigmoidoscopy or colonoscopy. You may have a sigmoidoscopy every 5 years or a colonoscopy  every 10 years starting at age 53.  Hepatitis C blood test.  Hepatitis B blood test.  Sexually  transmitted disease (STD) testing.  Diabetes screening. This is done by checking your blood sugar (glucose) after you have not eaten for a while (fasting). You may have this done every 1-3 years.  Mammogram. This may be done every 1-2 years. Talk to your health care provider about when you should start having regular mammograms. This may depend on whether you have a family history of breast cancer.  BRCA-related cancer screening. This may be done if you have a family history of breast, ovarian, tubal, or peritoneal cancers.  Pelvic exam and Pap test. This may be done every 3 years starting at age 54. Starting at age 59, this may be done every 5 years if you have a Pap test in combination with an HPV test.  Bone density scan. This is done to screen for osteoporosis. You may have this scan if you are at high risk for osteoporosis. Discuss your test results, treatment options, and if necessary, the need for more tests with your health care provider. Vaccines  Your health care provider may recommend certain vaccines, such as:  Influenza vaccine. This is recommended every year.  Tetanus, diphtheria, and acellular pertussis (Tdap, Td) vaccine. You may need a Td booster every 10 years.  Zoster vaccine. You may need this after age 70.  Pneumococcal 13-valent conjugate (PCV13) vaccine. You may need this if you have certain conditions and were not previously vaccinated.  Pneumococcal polysaccharide (PPSV23) vaccine. You may need one or two doses if you smoke cigarettes or if you have certain conditions. Talk to your health care provider about which screenings and vaccines you need and how often you need them. This information is not intended to replace advice given to you by your health care provider. Make sure you discuss any questions you have with your health care provider. Document Released: 07/07/2015 Document Revised: 02/28/2016 Document Reviewed: 04/11/2015 Elsevier Interactive Patient  Education  2017 [place] Prevention in the Home Falls can cause injuries. They can happen to people of all ages. There are many things you can do to make your home safe and to help prevent falls. What can I do on the outside of my home?  Regularly fix the edges of walkways and driveways and fix any cracks.  Remove anything that might make you trip as you walk through a door, such as a raised step or threshold.  Trim any bushes or trees on the path to your home.  Use bright outdoor lighting.  Clear any walking paths of anything that might make someone trip, such as rocks or tools.  Regularly check to see if handrails are loose or broken. Make sure that both sides of any steps have handrails.  Any raised decks and porches should have guardrails on the edges.  Have any leaves, snow, or ice cleared regularly.  Use sand or salt on walking paths during winter.  Clean up any spills in your garage right away. This includes oil or grease spills. What can I do in the bathroom?  Use night lights.  Install grab bars by the toilet and in the tub and shower. Do not use towel bars as grab bars.  Use non-skid mats or decals in the tub or shower.  If you need to sit down in the shower, use a plastic, non-slip stool.  Keep the floor dry. Clean up any water that  spills on the floor as soon as it happens.  Remove soap buildup in the tub or shower regularly.  Attach bath mats securely with double-sided non-slip rug tape.  Do not have throw rugs and other things on the floor that can make you trip. What can I do in the bedroom?  Use night lights.  Make sure that you have a light by your bed that is easy to reach.  Do not use any sheets or blankets that are too big for your bed. They should not hang down onto the floor.  Have a firm chair that has side arms. You can use this for support while you get dressed.  Do not have throw rugs and other things on the floor that can  make you trip. What can I do in the kitchen?  Clean up any spills right away.  Avoid walking on wet floors.  Keep items that you use a lot in easy-to-reach places.  If you need to reach something above you, use a strong step stool that has a grab bar.  Keep electrical cords out of the way.  Do not use floor polish or wax that makes floors slippery. If you must use wax, use non-skid floor wax.  Do not have throw rugs and other things on the floor that can make you trip. What can I do with my stairs?  Do not leave any items on the stairs.  Make sure that there are handrails on both sides of the stairs and use them. Fix handrails that are broken or loose. Make sure that handrails are as long as the stairways.  Check any carpeting to make sure that it is firmly attached to the stairs. Fix any carpet that is loose or worn.  Avoid having throw rugs at the top or bottom of the stairs. If you do have throw rugs, attach them to the floor with carpet tape.  Make sure that you have a light switch at the top of the stairs and the bottom of the stairs. If you do not have them, ask someone to add them for you. What else can I do to help prevent falls?  Wear shoes that:  Do not have high heels.  Have rubber bottoms.  Are comfortable and fit you well.  Are closed at the toe. Do not wear sandals.  If you use a stepladder:  Make sure that it is fully opened. Do not climb a closed stepladder.  Make sure that both sides of the stepladder are locked into place.  Ask someone to hold it for you, if possible.  Clearly mark and make sure that you can see:  Any grab bars or handrails.  First and last steps.  Where the edge of each step is.  Use tools that help you move around (mobility aids) if they are needed. These include:  Canes.  Walkers.  Scooters.  Crutches.  Turn on the lights when you go into a dark area. Replace any light bulbs as soon as they burn out.  Set up your  furniture so you have a clear path. Avoid moving your furniture around.  If any of your floors are uneven, fix them.  If there are any pets around you, be aware of where they are.  Review your medicines with your doctor. Some medicines can make you feel dizzy. This can increase your chance of falling. Ask your doctor what other things that you can do to help prevent falls. This information is not  intended to replace advice given to you by your health care provider. Make sure you discuss any questions you have with your health care provider. Document Released: 04/06/2009 Document Revised: 11/16/2015 Document Reviewed: 07/15/2014 Elsevier Interactive Patient Education  2017 [employer].
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
## 2020-08-23 ENCOUNTER — Other Ambulatory Visit: Payer: Self-pay | Admitting: Internal Medicine

## 2020-10-18 ENCOUNTER — Other Ambulatory Visit: Payer: Self-pay

## 2020-10-18 NOTE — Patient Instructions (Addendum)
  Blood work was ordered.     Medications changes include : increase rybelsus to 7 mg daily    Your prescription(s) have been submitted to your pharmacy. Please take as directed and contact our office if you believe you are having problem(s) with the medication(s).   A referral was ordered for neuro for a sleep study test and podiatry/foot doctor       Someone from their office will call you to schedule an appointment.    Please followup in 3 months
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
## 2020-10-19 ENCOUNTER — Encounter: Payer: Self-pay | Admitting: Internal Medicine

## 2020-10-19 ENCOUNTER — Ambulatory Visit (INDEPENDENT_AMBULATORY_CARE_PROVIDER_SITE_OTHER): Payer: Medicare Other | Admitting: Internal Medicine

## 2020-10-19 VITALS — BP 130/82 | HR 68 | Temp 98.3°F | Ht 62.0 in | Wt 206.6 lb

## 2020-10-19 DIAGNOSIS — K219 Gastro-esophageal reflux disease without esophagitis: Secondary | ICD-10-CM

## 2020-10-19 DIAGNOSIS — G4733 Obstructive sleep apnea (adult) (pediatric): Secondary | ICD-10-CM | POA: Diagnosis not present

## 2020-10-19 DIAGNOSIS — B070 Plantar wart: Secondary | ICD-10-CM

## 2020-10-19 DIAGNOSIS — D751 Secondary polycythemia: Secondary | ICD-10-CM

## 2020-10-19 DIAGNOSIS — E1142 Type 2 diabetes mellitus with diabetic polyneuropathy: Secondary | ICD-10-CM | POA: Diagnosis not present

## 2020-10-19 DIAGNOSIS — E782 Mixed hyperlipidemia: Secondary | ICD-10-CM

## 2020-10-19 DIAGNOSIS — R0683 Snoring: Secondary | ICD-10-CM | POA: Diagnosis not present

## 2020-10-19 LAB — HEMOGLOBIN A1C: Hgb A1c MFr Bld: 9.4 % — ABNORMAL HIGH (ref 4.6–6.5)

## 2020-10-19 LAB — COMPREHENSIVE METABOLIC PANEL
ALT: 26 U/L (ref 0–35)
AST: 18 U/L (ref 0–37)
Albumin: 3.9 g/dL (ref 3.5–5.2)
Alkaline Phosphatase: 45 U/L (ref 39–117)
BUN: 19 mg/dL (ref 6–23)
CO2: 27 mEq/L (ref 19–32)
Calcium: 9.7 mg/dL (ref 8.4–10.5)
Chloride: 103 mEq/L (ref 96–112)
Creatinine, Ser: 1.08 mg/dL (ref 0.40–1.20)
GFR: 56.08 mL/min — ABNORMAL LOW (ref >60.00)
Glucose, Bld: 251 mg/dL — ABNORMAL HIGH (ref 70–99)
Potassium: 4.2 mEq/L (ref 3.5–5.1)
Sodium: 138 mEq/L (ref 135–145)
Total Bilirubin: 1 mg/dL (ref 0.2–1.2)
Total Protein: 7.6 g/dL (ref 6.0–8.3)

## 2020-10-19 MED ORDER — RYBELSUS 7 MG PO TABS: 7.0000 mg | ORAL_TABLET | Freq: Every day | ORAL | 5 refills | Status: DC

## 2020-10-19 NOTE — Assessment & Plan Note (Signed)
Chronic Tested in past and has OSA - mild, but has never been on cpap ? Need cpap Denies snoring, but does wake up gasping for air, has fatigue some days Elevated H/H Will refer for further evaluation

## 2020-10-19 NOTE — Assessment & Plan Note (Addendum)
Chronic Not controlled - stressed the importance of getting sugars controlled She admits she was not taking her medications daily as prescribed prior to her last visit, but since then has been taking her meds daily She is eating better Sugar controlled at home Increase rybelsus to 7 mg daily Continue glipizide 10mg  bid, metformin 1000 mg bid Ck a1c, cmp

## 2020-10-19 NOTE — Assessment & Plan Note (Addendum)
Chronic GERD controlled, but states difficulty swallowing pills sometimes - occasionally food but feels that is from not having teeth  Continue omeprazole 40 mg daily

## 2020-10-19 NOTE — Assessment & Plan Note (Signed)
Chronic Stable ? Related to OSA  Referred to neuro for further evaluate OSA severity May need to see hem/onc

## 2020-10-30 ENCOUNTER — Ambulatory Visit: Payer: Medicare Other | Admitting: Podiatry

## 2020-11-03 ENCOUNTER — Ambulatory Visit: Payer: Medicare Other | Admitting: Podiatry

## 2020-11-18 ENCOUNTER — Other Ambulatory Visit: Payer: Self-pay | Admitting: Internal Medicine

## 2020-12-07 ENCOUNTER — Other Ambulatory Visit: Payer: Self-pay | Admitting: Internal Medicine

## 2020-12-21 ENCOUNTER — Other Ambulatory Visit: Payer: Self-pay | Admitting: Internal Medicine

## 2020-12-27 ENCOUNTER — Other Ambulatory Visit: Payer: Self-pay | Admitting: Internal Medicine

## 2021-01-04 ENCOUNTER — Other Ambulatory Visit: Payer: Self-pay | Admitting: Internal Medicine

## 2021-01-11 ENCOUNTER — Telehealth: Payer: Self-pay | Admitting: Internal Medicine

## 2021-01-11 NOTE — Telephone Encounter (Signed)
NP from [facility]calls wanted to call and make [place] aware that when doing the Hemoglobin A1C the levels were 11.4   Please f/u with patient
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
## 2021-01-17 NOTE — Patient Instructions (Signed)
°  Blood work was ordered.   ° ° °Medications changes include :    ° °Your prescription(s) have been submitted to your pharmacy. Please take as directed and contact our office if you believe you are having problem(s) with the medication(s). ° ° °A referral was ordered for        Someone from their office will call you to schedule an appointment.  ° ° °Please followup in 3 months ° °

## 2021-01-18 ENCOUNTER — Encounter: Payer: Medicare Other | Admitting: Internal Medicine

## 2021-01-18 ENCOUNTER — Other Ambulatory Visit: Payer: Self-pay | Admitting: Internal Medicine

## 2021-01-18 DIAGNOSIS — K219 Gastro-esophageal reflux disease without esophagitis: Secondary | ICD-10-CM

## 2021-01-18 DIAGNOSIS — E782 Mixed hyperlipidemia: Secondary | ICD-10-CM

## 2021-01-18 DIAGNOSIS — D751 Secondary polycythemia: Secondary | ICD-10-CM

## 2021-01-18 DIAGNOSIS — E1142 Type 2 diabetes mellitus with diabetic polyneuropathy: Secondary | ICD-10-CM

## 2021-02-03 ENCOUNTER — Other Ambulatory Visit: Payer: Self-pay | Admitting: Internal Medicine

## 2021-05-04 ENCOUNTER — Other Ambulatory Visit: Payer: Self-pay | Admitting: Internal Medicine

## 2021-06-06 ENCOUNTER — Ambulatory Visit: Payer: Medicare Other | Admitting: Internal Medicine

## 2021-06-24 ENCOUNTER — Encounter: Payer: Self-pay | Admitting: Internal Medicine
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
## 2021-06-24 NOTE — Patient Instructions (Addendum)
° °  Flu and prevnar pneumonia vaccines given.    Blood work was ordered.     Medications changes include :   decrease metformin to 1 pill twice a day.  Spironolactone 25 mg daily  Your prescription(s) have been submitted to your pharmacy. Please take as directed and contact our office if you believe you are having problem(s) with the medication(s).   Please followup in 3 months

## 2021-06-26 ENCOUNTER — Other Ambulatory Visit: Payer: Self-pay

## 2021-06-26 ENCOUNTER — Ambulatory Visit (INDEPENDENT_AMBULATORY_CARE_PROVIDER_SITE_OTHER): Payer: Medicare Other | Admitting: Internal Medicine

## 2021-06-26 VITALS — BP 128/84 | HR 103 | Temp 98.6°F | Ht 62.0 in | Wt 191.0 lb

## 2021-06-26 DIAGNOSIS — Z23 Encounter for immunization: Secondary | ICD-10-CM | POA: Diagnosis not present

## 2021-06-26 DIAGNOSIS — F32A Depression, unspecified: Secondary | ICD-10-CM | POA: Diagnosis not present

## 2021-06-26 DIAGNOSIS — E782 Mixed hyperlipidemia: Secondary | ICD-10-CM | POA: Diagnosis not present

## 2021-06-26 DIAGNOSIS — K219 Gastro-esophageal reflux disease without esophagitis: Secondary | ICD-10-CM

## 2021-06-26 DIAGNOSIS — F419 Anxiety disorder, unspecified: Secondary | ICD-10-CM

## 2021-06-26 DIAGNOSIS — E1142 Type 2 diabetes mellitus with diabetic polyneuropathy: Secondary | ICD-10-CM | POA: Diagnosis not present

## 2021-06-26 DIAGNOSIS — E538 Deficiency of other specified B group vitamins: Secondary | ICD-10-CM

## 2021-06-26 MED ORDER — OMEPRAZOLE 40 MG PO CPDR: DELAYED_RELEASE_CAPSULE | ORAL | 1 refills | Status: DC

## 2021-06-26 MED ORDER — ATORVASTATIN CALCIUM 20 MG PO TABS: 20.0000 mg | ORAL_TABLET | Freq: Every day | ORAL | 1 refills | Status: DC

## 2021-06-26 MED ORDER — RYBELSUS 7 MG PO TABS: 7.0000 mg | ORAL_TABLET | Freq: Every day | ORAL | 1 refills | Status: DC

## 2021-06-26 MED ORDER — SPIRONOLACTONE 25 MG PO TABS: 25.0000 mg | ORAL_TABLET | Freq: Every day | ORAL | 1 refills | Status: DC

## 2021-06-26 MED ORDER — DULOXETINE HCL 60 MG PO CPEP: 60.0000 mg | ORAL_CAPSULE | Freq: Every day | ORAL | 1 refills | Status: DC

## 2021-06-26 MED ORDER — GLIPIZIDE 10 MG PO TABS
10.0000 mg | ORAL_TABLET | Freq: Two times a day (BID) | ORAL | 3 refills | Status: DC
Start: 2021-06-26 — End: 2022-01-24

## 2021-06-26 MED ORDER — PREGABALIN 75 MG PO CAPS: 75.0000 mg | ORAL_CAPSULE | Freq: Two times a day (BID) | ORAL | 0 refills | Status: DC

## 2021-06-26 MED ORDER — METFORMIN HCL 500 MG PO TABS
500.0000 mg | ORAL_TABLET | Freq: Two times a day (BID) | ORAL | 1 refills | Status: DC
Start: 2021-06-26 — End: 2022-01-24

## 2021-06-26 NOTE — Assessment & Plan Note (Signed)
Chronic °Regular exercise and healthy diet encouraged °Check lipid panel  °Continue atorvastatin 20 mg daily °

## 2021-06-26 NOTE — Assessment & Plan Note (Addendum)
Chronic Depression and anxiety somewhat controlled-she has had a lot of things happen over the past several months She feels like she is doing okay and does not feel like she needs any changes in medication Continue Cymbalta 60 mg daily, bupropion XL 150 mg daily

## 2021-06-26 NOTE — Assessment & Plan Note (Signed)
Chronic GERD controlled Continue omeprazole 40 mg daily 

## 2021-06-26 NOTE — Assessment & Plan Note (Signed)
Chronic Not well controlled-A1c has been persistently elevated and sugars are elevated at home Has not been taking the Rybelsus-ran out Restart Rybelsus 7 mg daily-after 1 month can increase to 14 mg Continue glipizide 10 mg twice daily before meals Will decrease metformin from 1000 mg twice daily to 500 mg twice daily since she is having diarrhea and she is having a difficult time taking the medication because of the taste Stressed diabetic diet Stressed regular exercise-she plans on joining the gym Has lost some weight Check A1c, urine microalbumin Follow-up in 3 months

## 2021-06-26 NOTE — Assessment & Plan Note (Signed)
Chronic Taking B12 Check level 

## 2021-06-26 NOTE — Assessment & Plan Note (Signed)
Chronic Not ideally controlled Stressed that she needs to get her sugars better controlled Has not been taking the Lyrica-restart Lyrica 75 mg twice daily

## 2021-08-16 ENCOUNTER — Ambulatory Visit (INDEPENDENT_AMBULATORY_CARE_PROVIDER_SITE_OTHER): Payer: Medicare Other

## 2021-08-16 ENCOUNTER — Ambulatory Visit: Payer: Medicare Other | Admitting: Podiatry

## 2021-08-16 ENCOUNTER — Other Ambulatory Visit: Payer: Self-pay

## 2021-08-16 DIAGNOSIS — L97521 Non-pressure chronic ulcer of other part of left foot limited to breakdown of skin: Secondary | ICD-10-CM

## 2021-08-16 DIAGNOSIS — M79671 Pain in right foot: Secondary | ICD-10-CM

## 2021-08-16 DIAGNOSIS — E1149 Type 2 diabetes mellitus with other diabetic neurological complication: Secondary | ICD-10-CM | POA: Diagnosis not present

## 2021-08-16 DIAGNOSIS — M722 Plantar fascial fibromatosis: Secondary | ICD-10-CM | POA: Diagnosis not present

## 2021-08-16 DIAGNOSIS — M79672 Pain in left foot: Secondary | ICD-10-CM

## 2021-08-20 ENCOUNTER — Telehealth: Payer: Self-pay | Admitting: Internal Medicine

## 2021-08-20 NOTE — Telephone Encounter (Signed)
Left message for patient to call back to schedule Medicare Annual Wellness Visit   Last AWV  08/21/21  Please schedule at anytime with [facility] Advisor if patient calls the office back.    40 Minutes appointment   Any questions, please call me at [phone]
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
## 2021-08-24 ENCOUNTER — Encounter: Payer: Self-pay | Admitting: Internal Medicine

## 2021-09-20 ENCOUNTER — Telehealth: Payer: Self-pay | Admitting: Internal Medicine

## 2021-09-20 NOTE — Telephone Encounter (Signed)
Left message for patient to call back to schedule Medicare Annual Wellness Visit   Last AWV  08/21/20  Please schedule at anytime with [facility] Advisor if patient calls the office back.     Any questions, please call me at [phone] 

## 2021-09-23 ENCOUNTER — Encounter: Payer: Self-pay | Admitting: Internal Medicine

## 2021-09-23 NOTE — Patient Instructions (Addendum)
? ? ? ?  Blood work was ordered.   ? ? ?Medications changes include :    ? ? ?Your prescription(s) have been sent to your pharmacy.  ? ? ?A referral was ordered for XX.     Someone from that office will call you to schedule an appointment.  ? ? ?Return in about 3 months (around 12/24/2021). ? ?

## 2021-09-23 NOTE — Progress Notes (Signed)
Subjective:    Patient ID: Monica Welch, female    DOB: 04-May-1961, 61 y.o.   MRN: [id number]  This visit occurred during the SARS-CoV-2 public health emergency.  Safety protocols were in place, including screening questions prior to the visit, additional usage of staff PPE, and extensive cleaning of exam room while observing appropriate contact time as indicated for disinfecting solutions.     HPI Julieanne is here for follow up of her chronic medical problems, including DM w/ neuropathy, hld, GERD, anxiety, depression    Medications and allergies reviewed with patient and updated if appropriate.  Current Outpatient Medications on File Prior to Visit  Medication Sig Dispense Refill   ACCU-CHEK GUIDE test strip TEST BLOOD SUGAR UPTO 4 TIMES DAILY 350 strip 2   atorvastatin (LIPITOR) 20 MG tablet Take 1 tablet (20 mg total) by mouth daily. 90 tablet 1   buPROPion (WELLBUTRIN XL) 150 MG 24 hr tablet TAKE 1 TABLET BY MOUTH EVERY DAY 90 tablet 2   DULoxetine (CYMBALTA) 60 MG capsule Take 1 capsule (60 mg total) by mouth daily. 90 capsule 1   glipiZIDE (GLUCOTROL) 10 MG tablet Take 1 tablet (10 mg total) by mouth 2 (two) times daily before a meal. 60 tablet 3   Lancets Micro Thin 33G MISC CHECK BLOOD SUGAR UP TO 4 TIMES PER DAY 100 each 5   loratadine (CLARITIN) 10 MG tablet Take 1 tablet (10 mg total) by mouth daily. 90 tablet 1   metFORMIN (GLUCOPHAGE) 500 MG tablet Take 1 tablet (500 mg total) by mouth 2 (two) times daily with a meal. 180 tablet 1   Multiple Vitamin (MULTIVITAMIN) tablet Take 1 tablet by mouth daily.     naproxen sodium (ALEVE) 220 MG tablet Take 220 mg by mouth daily as needed.     omeprazole (PRILOSEC) 40 MG capsule TAKE 1 CAPSULE(40 MG) BY MOUTH DAILY 90 capsule 1   pregabalin (LYRICA) 75 MG capsule Take 1 capsule (75 mg total) by mouth 2 (two) times daily. 60 capsule 0   Semaglutide (RYBELSUS) 7 MG TABS Take 7 mg by mouth daily. 90 tablet 1    spironolactone (ALDACTONE) 25 MG tablet Take 1 tablet (25 mg total) by mouth daily. 90 tablet 1   tiZANidine (ZANAFLEX) 4 MG tablet Take 1 tablet (4 mg total) by mouth every 6 (six) hours as needed for muscle spasms. 30 tablet 0   No current facility-administered medications on file prior to visit.     Review of Systems     Objective:  There were no vitals filed for this visit. BP Readings from Last 3 Encounters:  06/26/21 128/84  10/19/20 130/82  07/21/20 120/82   Wt Readings from Last 3 Encounters:  06/26/21 191 lb (86.6 kg)  10/19/20 206 lb 9.6 oz (93.7 kg)  07/21/20 210 lb 9.6 oz (95.5 kg)   There is no height or weight on file to calculate BMI.    Physical Exam     Lab Results  Component Value Date   WBC 10.3 07/21/2020   HGB 16.7 (H) 07/21/2020   HCT 51.0 (H) 07/21/2020   PLT 238.0 07/21/2020   GLUCOSE 251 (H) 10/19/2020   CHOL 158 07/21/2020   TRIG 297.0 (H) 07/21/2020   HDL 33.00 (L) 07/21/2020   LDLDIRECT 98.0 07/21/2020   ALT 26 10/19/2020   AST 18 10/19/2020   NA 138 10/19/2020   K 4.2 10/19/2020   CL 103 10/19/2020   CREATININE 1.08  10/19/2020   BUN 19 10/19/2020   CO2 27 10/19/2020   TSH 1.01 02/15/2019   INR 0.97 09/06/2011   HGBA1C 9.4 (H) 10/19/2020   MICROALBUR 2.2 (H) 02/15/2019     Assessment & Plan:    See Problem List for Assessment and Plan of chronic medical problems.   This encounter was created in error - please disregard.

## 2021-09-24 ENCOUNTER — Encounter: Payer: Medicare Other | Admitting: Internal Medicine

## 2021-09-24 DIAGNOSIS — E1142 Type 2 diabetes mellitus with diabetic polyneuropathy: Secondary | ICD-10-CM

## 2021-09-24 DIAGNOSIS — K219 Gastro-esophageal reflux disease without esophagitis: Secondary | ICD-10-CM

## 2021-09-24 DIAGNOSIS — E782 Mixed hyperlipidemia: Secondary | ICD-10-CM

## 2021-09-24 DIAGNOSIS — F32A Depression, unspecified: Secondary | ICD-10-CM

## 2021-11-13 ENCOUNTER — Ambulatory Visit: Payer: Medicare Other | Admitting: Podiatry

## 2021-11-21 ENCOUNTER — Telehealth: Payer: Self-pay | Admitting: Internal Medicine

## 2021-11-21 NOTE — Telephone Encounter (Signed)
LVM for pt tp rtn my call to schedule AWV with [facility]. Call back # [phone]

## 2022-01-23 ENCOUNTER — Telehealth: Payer: Self-pay

## 2022-01-23 NOTE — Telephone Encounter (Signed)
Caller & Relationship to patient: Sia with [facility]  Call back number: Fax [phone]  Date of last office visit: 06-26-21  Date of next office visit: N/A  Medication(s) to be refilled: atorvastatin (LIPITOR) 20 MG tablet  buPROPion (WELLBUTRIN XL) 150 MG 24 hr tablet  DULoxetine (CYMBALTA) 60 MG capsule  glipiZIDE (GLUCOTROL) 10 MG tablet  metFORMIN (GLUCOPHAGE) 500 MG tablet  omeprazole (PRILOSEC) 40 MG capsule  Semaglutide (RYBELSUS) 7 MG TABS  spironolactone (ALDACTONE) 25 MG tablet  tiZANidine (ZANAFLEX) 4 MG tablet  Preferred Pharmacy: [facility] Fax [phone]

## 2022-01-24 MED ORDER — SPIRONOLACTONE 25 MG PO TABS: 25.0000 mg | ORAL_TABLET | Freq: Every day | ORAL | 1 refills | Status: DC

## 2022-01-24 MED ORDER — ATORVASTATIN CALCIUM 20 MG PO TABS: 20.0000 mg | ORAL_TABLET | Freq: Every day | ORAL | 1 refills | Status: DC

## 2022-01-24 MED ORDER — BUPROPION HCL ER (XL) 150 MG PO TB24: 150.0000 mg | ORAL_TABLET | Freq: Every day | ORAL | 1 refills | Status: DC

## 2022-01-24 MED ORDER — TIZANIDINE HCL 4 MG PO TABS
4.0000 mg | ORAL_TABLET | Freq: Four times a day (QID) | ORAL | 0 refills | Status: DC | PRN
Start: 2022-01-24 — End: 2022-01-30

## 2022-01-24 MED ORDER — DULOXETINE HCL 60 MG PO CPEP: 60.0000 mg | ORAL_CAPSULE | Freq: Every day | ORAL | 1 refills | Status: DC

## 2022-01-24 MED ORDER — METFORMIN HCL 500 MG PO TABS: 500.0000 mg | ORAL_TABLET | Freq: Two times a day (BID) | ORAL | 1 refills | Status: DC

## 2022-01-24 MED ORDER — OMEPRAZOLE 40 MG PO CPDR: DELAYED_RELEASE_CAPSULE | ORAL | 1 refills | Status: DC

## 2022-01-24 MED ORDER — RYBELSUS 7 MG PO TABS: 7.0000 mg | ORAL_TABLET | Freq: Every day | ORAL | 1 refills | Status: DC

## 2022-01-24 MED ORDER — GLIPIZIDE 10 MG PO TABS: 10.0000 mg | ORAL_TABLET | Freq: Two times a day (BID) | ORAL | 1 refills | Status: DC

## 2022-01-24 NOTE — Telephone Encounter (Signed)
Updated pharmacy sent refills to [facility].Marland KitchenShearon Stalls

## 2022-01-28 NOTE — Telephone Encounter (Signed)
Monica Welch from [facility] called today to get a status update on refills. Advised Monica Welch "sent refills to [facility] mail [facility]". Advised her of the day and time rx was sent and advised receipt confirmed by pharmacy. Monica Welch states they haven't received any refill requests and would like it sent again.    Please advise

## 2022-01-30 MED ORDER — DULOXETINE HCL 60 MG PO CPEP: 60.0000 mg | ORAL_CAPSULE | Freq: Every day | ORAL | 1 refills | Status: DC

## 2022-01-30 MED ORDER — BUPROPION HCL ER (XL) 150 MG PO TB24: 150.0000 mg | ORAL_TABLET | Freq: Every day | ORAL | 1 refills | Status: DC

## 2022-01-30 MED ORDER — TIZANIDINE HCL 4 MG PO TABS: 4.0000 mg | ORAL_TABLET | Freq: Four times a day (QID) | ORAL | 0 refills | Status: DC | PRN

## 2022-01-30 MED ORDER — METFORMIN HCL 500 MG PO TABS: 500.0000 mg | ORAL_TABLET | Freq: Two times a day (BID) | ORAL | 1 refills | Status: DC

## 2022-01-30 MED ORDER — RYBELSUS 7 MG PO TABS: 7.0000 mg | ORAL_TABLET | Freq: Every day | ORAL | 1 refills | Status: DC

## 2022-01-30 MED ORDER — LORATADINE 10 MG PO TABS: 10.0000 mg | ORAL_TABLET | Freq: Every day | ORAL | 1 refills | Status: DC

## 2022-01-30 MED ORDER — GLIPIZIDE 10 MG PO TABS: 10.0000 mg | ORAL_TABLET | Freq: Two times a day (BID) | ORAL | 1 refills | Status: DC

## 2022-01-30 MED ORDER — ATORVASTATIN CALCIUM 20 MG PO TABS: 20.0000 mg | ORAL_TABLET | Freq: Every day | ORAL | 1 refills | Status: DC

## 2022-01-30 MED ORDER — SPIRONOLACTONE 25 MG PO TABS: 25.0000 mg | ORAL_TABLET | Freq: Every day | ORAL | 1 refills | Status: DC

## 2022-01-30 MED ORDER — OMEPRAZOLE 40 MG PO CPDR: DELAYED_RELEASE_CAPSULE | ORAL | 1 refills | Status: DC

## 2022-01-30 NOTE — Addendum Note (Signed)
Addended by: Deatra James on: 01/30/2022 10:31 AM   Modules accepted: Orders

## 2022-01-30 NOTE — Telephone Encounter (Signed)
Sent ll medications again to [facility]../lm,b

## 2022-03-02 ENCOUNTER — Encounter: Payer: Self-pay | Admitting: Internal Medicine

## 2022-03-02 NOTE — Progress Notes (Unsigned)
Subjective:    Patient ID: Monica Welch, female    DOB: 03/08/1961, 61 y.o.   MRN: [id number]      HPI Monica Welch is here for a Physical exam.   Got a new dog - makes her very happy - walking the dog.  Taking her meds about 2/week - the pills choke her.  She knows she needs to take them.    Medications and allergies reviewed with patient and updated if appropriate.  Current Outpatient Medications on File Prior to Visit  Medication Sig Dispense Refill   ACCU-CHEK GUIDE test strip TEST BLOOD SUGAR UPTO 4 TIMES DAILY 350 strip 2   atorvastatin (LIPITOR) 20 MG tablet Take 1 tablet (20 mg total) by mouth daily. Annual appt due in JAN "24"must see provider for future refills 90 tablet 1   buPROPion (WELLBUTRIN XL) 150 MG 24 hr tablet Take 1 tablet (150 mg total) by mouth daily. Annual appt due in JAN "24"must see provider for future refills 90 tablet 1   DULoxetine (CYMBALTA) 60 MG capsule Take 1 capsule (60 mg total) by mouth daily. Annual appt due in JAN "24"must see provider for future refills 90 capsule 1   glipiZIDE (GLUCOTROL) 10 MG tablet Take 1 tablet (10 mg total) by mouth 2 (two) times daily before a meal. 180 tablet 1   Lancets Micro Thin 33G MISC CHECK BLOOD SUGAR UP TO 4 TIMES PER DAY 100 each 5   loratadine (CLARITIN) 10 MG tablet Take 1 tablet (10 mg total) by mouth daily. 90 tablet 1   metFORMIN (GLUCOPHAGE) 500 MG tablet Take 1 tablet (500 mg total) by mouth 2 (two) times daily with a meal. Annual appt due in JAN "24"must see provider for future refills 180 tablet 1   Multiple Vitamin (MULTIVITAMIN) tablet Take 1 tablet by mouth daily.     naproxen sodium (ALEVE) 220 MG tablet Take 220 mg by mouth daily as needed.     omeprazole (PRILOSEC) 40 MG capsule TAKE 1 CAPSULE(40 MG) BY MOUTH DAILY Annual appt due in JAN "24"must see provider for future refills 90 capsule 1   pregabalin (LYRICA) 75 MG capsule Take 1 capsule (75 mg total) by mouth 2 (two) times daily. 60  capsule 0   Semaglutide (RYBELSUS) 7 MG TABS Take 7 mg by mouth daily. Annual appt due in JAN "24"must see provider for future refills 90 tablet 1   spironolactone (ALDACTONE) 25 MG tablet Take 1 tablet (25 mg total) by mouth daily. Annual appt due in JAN "24"must see provider for future refills 90 tablet 1   tiZANidine (ZANAFLEX) 4 MG tablet Take 1 tablet (4 mg total) by mouth every 6 (six) hours as needed for muscle spasms. Annual appt due in JAN "24"must see provider for future refills 90 tablet 0   No current facility-administered medications on file prior to visit.    Review of Systems  Constitutional:  Negative for chills and fever.  Eyes:  Positive for visual disturbance (sometimes).  Respiratory:  Positive for cough (chronic) and shortness of breath (doing up stairs). Negative for wheezing.   Cardiovascular:  Positive for leg swelling (left ankle - chronic). Negative for chest pain and palpitations.  Gastrointestinal:  Positive for abdominal pain (pelvic pain), blood in stool and diarrhea. Negative for constipation and nausea.       GERD  Endocrine: Negative for polydipsia.  Genitourinary:  Positive for frequency, pelvic pain (intermittent x 1 month) and urgency. Negative for dysuria, hematuria  and vaginal bleeding.       No dark urine, no odor to urine  Musculoskeletal:  Positive for arthralgias (hands). Negative for back pain.  Skin:  Negative for rash.  Neurological:  Negative for light-headedness and headaches.  Psychiatric/Behavioral:  Positive for dysphoric mood. The patient is nervous/anxious.        Objective:   Vitals:   03/04/22 1512  BP: 130/74  Pulse: 80  Temp: 97.9 F (36.6 C)  SpO2: 96%   Filed Weights   03/04/22 1512  Weight: 189 lb (85.7 kg)   Body mass index is 34.57 kg/m.  BP Readings from Last 3 Encounters:  03/04/22 130/74  06/26/21 128/84  10/19/20 130/82    Wt Readings from Last 3 Encounters:  03/04/22 189 lb (85.7 kg)  06/26/21 191 lb  (86.6 kg)  10/19/20 206 lb 9.6 oz (93.7 kg)       Physical Exam Constitutional: She appears well-developed and well-nourished. No distress.  HENT:  Head: Normocephalic and atraumatic.  Right Ear: External ear normal. Normal ear canal and TM Left Ear: External ear normal.  Normal ear canal and TM Mouth/Throat: Oropharynx is clear and moist.  Eyes: Conjunctivae normal.  Neck: Neck supple. No tracheal deviation present. No thyromegaly present.  No carotid bruit  Cardiovascular: Normal rate, regular rhythm and normal heart sounds.   No murmur heard.  No edema. Pulmonary/Chest: Effort normal and breath sounds normal. No respiratory distress. She has no wheezes. She has no rales.  Breast: deferred   Abdominal: Soft. She exhibits no distension. There is no tenderness.  Lymphadenopathy: She has no cervical adenopathy.  Skin: Skin is warm and dry. She is not diaphoretic.  Psychiatric: She has a normal mood and affect. Her behavior is normal.     Lab Results  Component Value Date   WBC 10.3 07/21/2020   HGB 16.7 (H) 07/21/2020   HCT 51.0 (H) 07/21/2020   PLT 238.0 07/21/2020   GLUCOSE 251 (H) 10/19/2020   CHOL 158 07/21/2020   TRIG 297.0 (H) 07/21/2020   HDL 33.00 (L) 07/21/2020   LDLDIRECT 98.0 07/21/2020   ALT 26 10/19/2020   AST 18 10/19/2020   NA 138 10/19/2020   K 4.2 10/19/2020   CL 103 10/19/2020   CREATININE 1.08 10/19/2020   BUN 19 10/19/2020   CO2 27 10/19/2020   TSH 1.01 02/15/2019   INR 0.97 09/06/2011   HGBA1C 9.4 (H) 10/19/2020   MICROALBUR 2.2 (H) 02/15/2019         Assessment & Plan:   Physical exam: Screening blood work  ordered Exercise  walking the dog 1/4 mile a day Weight  obese - stressed weight loss -- eats small meals/snacks frequently Substance abuse  none   Reviewed recommended immunizations.  Flu vaccine   Health Maintenance  Topic Date Due   COVID-19 Vaccine (1) Never done   Zoster Vaccines- Shingrix (1 of 2) Never done   PAP  SMEAR-Modifier  Never done   Diabetic kidney evaluation - Urine ACR  02/15/2020   OPHTHALMOLOGY EXAM  05/03/2020   Fecal DNA (Cologuard)  03/13/2021   HEMOGLOBIN A1C  04/20/2021   MAMMOGRAM  06/07/2021   Diabetic kidney evaluation - GFR measurement  10/19/2021   INFLUENZA VACCINE  01/22/2022   FOOT EXAM  08/16/2022   TETANUS/TDAP  02/28/2028   Hepatitis C Screening  Completed   HIV Screening  Completed   HPV VACCINES  Aged Out  See Problem List for Assessment and Plan of chronic medical problems.

## 2022-03-04 ENCOUNTER — Ambulatory Visit (INDEPENDENT_AMBULATORY_CARE_PROVIDER_SITE_OTHER): Payer: No Typology Code available for payment source | Admitting: Internal Medicine

## 2022-03-04 ENCOUNTER — Telehealth: Payer: Self-pay

## 2022-03-04 VITALS — BP 130/74 | HR 80 | Temp 97.9°F | Ht 62.0 in | Wt 189.0 lb

## 2022-03-04 DIAGNOSIS — Z0000 Encounter for general adult medical examination without abnormal findings: Secondary | ICD-10-CM | POA: Diagnosis not present

## 2022-03-04 DIAGNOSIS — F419 Anxiety disorder, unspecified: Secondary | ICD-10-CM

## 2022-03-04 DIAGNOSIS — E1142 Type 2 diabetes mellitus with diabetic polyneuropathy: Secondary | ICD-10-CM

## 2022-03-04 DIAGNOSIS — Z23 Encounter for immunization: Secondary | ICD-10-CM

## 2022-03-04 DIAGNOSIS — R102 Pelvic and perineal pain: Secondary | ICD-10-CM | POA: Diagnosis not present

## 2022-03-04 DIAGNOSIS — F32A Depression, unspecified: Secondary | ICD-10-CM

## 2022-03-04 DIAGNOSIS — R3915 Urgency of urination: Secondary | ICD-10-CM | POA: Diagnosis not present

## 2022-03-04 DIAGNOSIS — K219 Gastro-esophageal reflux disease without esophagitis: Secondary | ICD-10-CM

## 2022-03-04 DIAGNOSIS — B354 Tinea corporis: Secondary | ICD-10-CM

## 2022-03-04 DIAGNOSIS — Z6841 Body Mass Index (BMI) 40.0 and over, adult: Secondary | ICD-10-CM | POA: Diagnosis not present

## 2022-03-04 DIAGNOSIS — E538 Deficiency of other specified B group vitamins: Secondary | ICD-10-CM

## 2022-03-04 DIAGNOSIS — E782 Mixed hyperlipidemia: Secondary | ICD-10-CM

## 2022-03-04 LAB — TSH: TSH: 1.25 u[IU]/mL (ref 0.35–5.50)

## 2022-03-04 MED ORDER — PREGABALIN 75 MG PO CAPS: 75.0000 mg | ORAL_CAPSULE | Freq: Two times a day (BID) | ORAL | 0 refills | Status: DC

## 2022-03-04 MED ORDER — GLIPIZIDE 10 MG PO TABS: 10.0000 mg | ORAL_TABLET | Freq: Two times a day (BID) | ORAL | 1 refills | Status: DC

## 2022-03-04 MED ORDER — METFORMIN HCL 500 MG PO TABS: 500.0000 mg | ORAL_TABLET | Freq: Two times a day (BID) | ORAL | 1 refills | Status: DC

## 2022-03-04 MED ORDER — ATORVASTATIN CALCIUM 20 MG PO TABS: 20.0000 mg | ORAL_TABLET | Freq: Every day | ORAL | 1 refills | Status: DC

## 2022-03-04 MED ORDER — RYBELSUS 7 MG PO TABS: 7.0000 mg | ORAL_TABLET | Freq: Every day | ORAL | 1 refills | Status: DC

## 2022-03-04 MED ORDER — BLOOD GLUCOSE MONITOR KIT: PACK | 0 refills | Status: DC

## 2022-03-04 MED ORDER — NYSTATIN 100000 UNIT/GM EX POWD: 1.0000 | Freq: Three times a day (TID) | CUTANEOUS | 2 refills | Status: DC

## 2022-03-04 MED ORDER — BUPROPION HCL ER (XL) 150 MG PO TB24: 150.0000 mg | ORAL_TABLET | Freq: Every day | ORAL | 1 refills | Status: DC

## 2022-03-04 MED ORDER — FLUCONAZOLE 150 MG PO TABS: 150.0000 mg | ORAL_TABLET | Freq: Once | ORAL | 0 refills | Status: AC

## 2022-03-04 MED ORDER — LANCETS MICRO THIN 33G MISC: 5 refills | Status: DC

## 2022-03-04 MED ORDER — DULOXETINE HCL 60 MG PO CPEP: 60.0000 mg | ORAL_CAPSULE | Freq: Every day | ORAL | 1 refills | Status: DC

## 2022-03-04 MED ORDER — OMEPRAZOLE 40 MG PO CPDR: DELAYED_RELEASE_CAPSULE | ORAL | 1 refills | Status: DC

## 2022-03-04 MED ORDER — TIZANIDINE HCL 4 MG PO TABS
4.0000 mg | ORAL_TABLET | Freq: Four times a day (QID) | ORAL | 0 refills | Status: DC | PRN
Start: 2022-03-04 — End: 2022-04-22

## 2022-03-04 NOTE — Assessment & Plan Note (Signed)
Chronic Not controlled - not on medication Restart lyrica 75 mg bid, cymbalta 60 mg daily

## 2022-03-04 NOTE — Patient Instructions (Addendum)
Flu immunization administered today.    Blood work was ordered.     Medications changes include :   restart medications.  Nystatin powder and fluconazole pill x 1 for yeast infection.     Your prescription(s) have been sent to your pharmacy.    A referral was ordered for Gyn - Dr Oscar La.     Someone from that office will call you to schedule an appointment.    Return in about 3 months (around 06/03/2022) for follow up.    Health Maintenance, Female Adopting a healthy lifestyle and getting preventive care are important in promoting health and wellness. Ask your health care provider about: The right schedule for you to have regular tests and exams. Things you can do on your own to prevent diseases and keep yourself healthy. What should I know about diet, weight, and exercise? Eat a healthy diet  Eat a diet that includes plenty of vegetables, fruits, low-fat dairy products, and lean protein. Do not eat a lot of foods that are high in solid fats, added sugars, or sodium. Maintain a healthy weight Body mass index (BMI) is used to identify weight problems. It estimates body fat based on height and weight. Your health care provider can help determine your BMI and help you achieve or maintain a healthy weight. Get regular exercise Get regular exercise. This is one of the most important things you can do for your health. Most adults should: Exercise for at least 150 minutes each week. The exercise should increase your heart rate and make you sweat (moderate-intensity exercise). Do strengthening exercises at least twice a week. This is in addition to the moderate-intensity exercise. Spend less time sitting. Even light physical activity can be beneficial. Watch cholesterol and blood lipids Have your blood tested for lipids and cholesterol at 61 years of age, then have this test every 5 years. Have your cholesterol levels checked more often if: Your lipid or cholesterol levels are  high. You are older than 61 years of age. You are at high risk for heart disease. What should I know about cancer screening? Depending on your health history and family history, you may need to have cancer screening at various ages. This may include screening for: Breast cancer. Cervical cancer. Colorectal cancer. Skin cancer. Lung cancer. What should I know about heart disease, diabetes, and high blood pressure? Blood pressure and heart disease High blood pressure causes heart disease and increases the risk of stroke. This is more likely to develop in people who have high blood pressure readings or are overweight. Have your blood pressure checked: Every 3-5 years if you are 56-75 years of age. Every year if you are 78 years old or older. Diabetes Have regular diabetes screenings. This checks your fasting blood sugar level. Have the screening done: Once every three years after age 71 if you are at a normal weight and have a low risk for diabetes. More often and at a younger age if you are overweight or have a high risk for diabetes. What should I know about preventing infection? Hepatitis B If you have a higher risk for hepatitis B, you should be screened for this virus. Talk with your health care provider to find out if you are at risk for hepatitis B infection. Hepatitis C Testing is recommended for: Everyone born from [birth year] through 1965. Anyone with known risk factors for hepatitis C. Sexually transmitted infections (STIs) Get screened for STIs, including gonorrhea and chlamydia, if: You are sexually active  and are younger than 61 years of age. You are older than 61 years of age and your health care provider tells you that you are at risk for this type of infection. Your sexual activity has changed since you were last screened, and you are at increased risk for chlamydia or gonorrhea. Ask your health care provider if you are at risk. Ask your health care provider about whether you  are at high risk for HIV. Your health care provider may recommend a prescription medicine to help prevent HIV infection. If you choose to take medicine to prevent HIV, you should first get tested for HIV. You should then be tested every 3 months for as long as you are taking the medicine. Pregnancy If you are about to stop having your period (premenopausal) and you may become pregnant, seek counseling before you get pregnant. Take 400 to 800 micrograms (mcg) of folic acid every day if you become pregnant. Ask for birth control (contraception) if you want to prevent pregnancy. Osteoporosis and menopause Osteoporosis is a disease in which the bones lose minerals and strength with aging. This can result in bone fractures. If you are 17 years old or older, or if you are at risk for osteoporosis and fractures, ask your health care provider if you should: Be screened for bone loss. Take a calcium or vitamin D supplement to lower your risk of fractures. Be given hormone replacement therapy (HRT) to treat symptoms of menopause. Follow these instructions at home: Alcohol use Do not drink alcohol if: Your health care provider tells you not to drink. You are pregnant, may be pregnant, or are planning to become pregnant. If you drink alcohol: Limit how much you have to: 0-1 drink a day. Know how much alcohol is in your drink. In the [place] one drink equals one 12 oz bottle of beer (355 mL), one 5 oz glass of wine (148 mL), or one 1 oz glass of hard liquor (44 mL). Lifestyle Do not use any products that contain nicotine or tobacco. These products include cigarettes, chewing tobacco, and vaping devices, such as e-cigarettes. If you need help quitting, ask your health care provider. Do not use street drugs. Do not share needles. Ask your health care provider for help if you need support or information about quitting drugs. General instructions Schedule regular health, dental, and eye exams. Stay current  with your vaccines. Tell your health care provider if: You often feel depressed. You have ever been abused or do not feel safe at home. Summary Adopting a healthy lifestyle and getting preventive care are important in promoting health and wellness. Follow your health care provider's instructions about healthy diet, exercising, and getting tested or screened for diseases. Follow your health care provider's instructions on monitoring your cholesterol and blood pressure. This information is not intended to replace advice given to you by your health care provider. Make sure you discuss any questions you have with your health care provider. Document Revised: 10/30/2020 Document Reviewed: 10/30/2020 Elsevier Patient Education  Monica Welch Maintenance, Female Adopting a healthy lifestyle and getting preventive care are important in promoting health and wellness. Ask your health care provider about: The right schedule for you to have regular tests and exams. Things you can do on your own to prevent diseases and keep yourself healthy. What should I know about diet, weight, and exercise? Eat a healthy diet  Eat a diet that includes plenty of vegetables, fruits, low-fat dairy products, and lean protein. Do  not eat a lot of foods that are high in solid fats, added sugars, or sodium. Maintain a healthy weight Body mass index (BMI) is used to identify weight problems. It estimates body fat based on height and weight. Your health care provider can help determine your BMI and help you achieve or maintain a healthy weight. Get regular exercise Get regular exercise. This is one of the most important things you can do for your health. Most adults should: Exercise for at least 150 minutes each week. The exercise should increase your heart rate and make you sweat (moderate-intensity exercise). Do strengthening exercises at least twice a week. This is in addition to the moderate-intensity  exercise. Spend less time sitting. Even light physical activity can be beneficial. Watch cholesterol and blood lipids Have your blood tested for lipids and cholesterol at 61 years of age, then have this test every 5 years. Have your cholesterol levels checked more often if: Your lipid or cholesterol levels are high. You are older than 61 years of age. You are at high risk for heart disease. What should I know about cancer screening? Depending on your health history and family history, you may need to have cancer screening at various ages. This may include screening for: Breast cancer. Cervical cancer. Colorectal cancer. Skin cancer. Lung cancer. What should I know about heart disease, diabetes, and high blood pressure? Blood pressure and heart disease High blood pressure causes heart disease and increases the risk of stroke. This is more likely to develop in people who have high blood pressure readings or are overweight. Have your blood pressure checked: Every 3-5 years if you are 72-12 years of age. Every year if you are 54 years old or older. Diabetes Have regular diabetes screenings. This checks your fasting blood sugar level. Have the screening done: Once every three years after age 24 if you are at a normal weight and have a low risk for diabetes. More often and at a younger age if you are overweight or have a high risk for diabetes. What should I know about preventing infection? Hepatitis B If you have a higher risk for hepatitis B, you should be screened for this virus. Talk with your health care provider to find out if you are at risk for hepatitis B infection. Hepatitis C Testing is recommended for: Everyone born from [birth year] through 1965. Anyone with known risk factors for hepatitis C. Sexually transmitted infections (STIs) Get screened for STIs, including gonorrhea and chlamydia, if: You are sexually active and are younger than 61 years of age. You are older than 61 years  of age and your health care provider tells you that you are at risk for this type of infection. Your sexual activity has changed since you were last screened, and you are at increased risk for chlamydia or gonorrhea. Ask your health care provider if you are at risk. Ask your health care provider about whether you are at high risk for HIV. Your health care provider may recommend a prescription medicine to help prevent HIV infection. If you choose to take medicine to prevent HIV, you should first get tested for HIV. You should then be tested every 3 months for as long as you are taking the medicine. Pregnancy If you are about to stop having your period (premenopausal) and you may become pregnant, seek counseling before you get pregnant. Take 400 to 800 micrograms (mcg) of folic acid every day if you become pregnant. Ask for birth control (contraception) if you  want to prevent pregnancy. Osteoporosis and menopause Osteoporosis is a disease in which the bones lose minerals and strength with aging. This can result in bone fractures. If you are 28 years old or older, or if you are at risk for osteoporosis and fractures, ask your health care provider if you should: Be screened for bone loss. Take a calcium or vitamin D supplement to lower your risk of fractures. Be given hormone replacement therapy (HRT) to treat symptoms of menopause. Follow these instructions at home: Alcohol use Do not drink alcohol if: Your health care provider tells you not to drink. You are pregnant, may be pregnant, or are planning to become pregnant. If you drink alcohol: Limit how much you have to: 0-1 drink a day. Know how much alcohol is in your drink. In the [place] one drink equals one 12 oz bottle of beer (355 mL), one 5 oz glass of wine (148 mL), or one 1 oz glass of hard liquor (44 mL). Lifestyle Do not use any products that contain nicotine or tobacco. These products include cigarettes, chewing tobacco, and vaping  devices, such as e-cigarettes. If you need help quitting, ask your health care provider. Do not use street drugs. Do not share needles. Ask your health care provider for help if you need support or information about quitting drugs. General instructions Schedule regular health, dental, and eye exams. Stay current with your vaccines. Tell your health care provider if: You often feel depressed. You have ever been abused or do not feel safe at home. Summary Adopting a healthy lifestyle and getting preventive care are important in promoting health and wellness. Follow your health care provider's instructions about healthy diet, exercising, and getting tested or screened for diseases. Follow your health care provider's instructions on monitoring your cholesterol and blood pressure. This information is not intended to replace advice given to you by your health care provider. Make sure you discuss any questions you have with your health care provider. Document Revised: 10/30/2020 Document Reviewed: 10/30/2020 Elsevier Patient Education  2023 [employer].

## 2022-03-04 NOTE — Assessment & Plan Note (Signed)
Chronic Stressed weight loss Keep walking dog Decrease portions, eat more veges/fruits

## 2022-03-04 NOTE — Assessment & Plan Note (Signed)
Chronic GERD not controlled since not on medication Restart omeprazole 40 mg daily

## 2022-03-04 NOTE — Assessment & Plan Note (Signed)
Chronic Has not been taking meds except for maybe 2/week Stressed medication compliance - discussed I expect her sugars to be very high Restart all meds - glipizide 10 mg bid, metformin 500 mg bid, rybelsus 7 mg daily Stressed diabetic diet, exercise, weight loss

## 2022-03-04 NOTE — Assessment & Plan Note (Signed)
New - x 1 month Check ua, ucx Will refer to gyn - has not seen anyone in a while

## 2022-03-04 NOTE — Assessment & Plan Note (Signed)
Chronic Not controlled since  Not on medication Restart med - cymbalta 60 mg daily

## 2022-03-04 NOTE — Assessment & Plan Note (Signed)
Chronic Check lipid panel  Restart atorvastatin 20 mg daily Regular exercise and healthy diet encouraged

## 2022-03-04 NOTE — Assessment & Plan Note (Signed)
Chronic Check B12  

## 2022-03-04 NOTE — Assessment & Plan Note (Signed)
With pelvic pain ? uti Ua, ucx

## 2022-03-04 NOTE — Assessment & Plan Note (Signed)
Acute Under breasts Fluconazole 150 mg x 1 Start nystatin powder under breasts

## 2022-03-05 LAB — MICROALBUMIN / CREATININE URINE RATIO
Creatinine,U: 40.5 mg/dL
Microalb Creat Ratio: 1.7 mg/g (ref 0.0–30.0)
Microalb, Ur: <0.7 mg/dL (ref 0.0–1.9)

## 2022-03-05 LAB — CBC WITH DIFFERENTIAL/PLATELET
Basophils Absolute: 0 10*3/uL (ref 0.0–0.1)
Basophils Relative: 0.1 % (ref 0.0–3.0)
Eosinophils Absolute: 0.4 10*3/uL (ref 0.0–0.7)
Eosinophils Relative: 4.1 % (ref 0.0–5.0)
HCT: 46.7 % — ABNORMAL HIGH (ref 36.0–46.0)
Hemoglobin: 15.5 g/dL — ABNORMAL HIGH (ref 12.0–15.0)
Lymphocytes Relative: 26 % (ref 12.0–46.0)
Lymphs Abs: 2.7 10*3/uL (ref 0.7–4.0)
MCHC: 33.1 g/dL (ref 30.0–36.0)
MCV: 91.5 fl (ref 78.0–100.0)
Monocytes Absolute: 0.5 10*3/uL (ref 0.1–1.0)
Monocytes Relative: 4.6 % (ref 3.0–12.0)
Neutro Abs: 6.8 10*3/uL (ref 1.4–7.7)
Neutrophils Relative %: 65.2 % (ref 43.0–77.0)
Platelets: 201 10*3/uL (ref 150.0–400.0)
RBC: 5.1 Mil/uL (ref 3.87–5.11)
RDW: 13.8 % (ref 11.5–15.5)
WBC: 10.4 10*3/uL (ref 4.0–10.5)

## 2022-03-05 LAB — URINALYSIS, ROUTINE W REFLEX MICROSCOPIC
Bilirubin Urine: NEGATIVE
Hgb urine dipstick: NEGATIVE
Ketones, ur: NEGATIVE
Leukocytes,Ua: NEGATIVE
Nitrite: NEGATIVE
RBC / HPF: NONE SEEN (ref 0–?)
Specific Gravity, Urine: 1.01 (ref 1.000–1.030)
Total Protein, Urine: NEGATIVE
Urine Glucose: >=1000 — AB
Urobilinogen, UA: 0.2 (ref 0.0–1.0)
pH: 7 (ref 5.0–8.0)

## 2022-03-05 LAB — URINE CULTURE

## 2022-03-05 LAB — COMPREHENSIVE METABOLIC PANEL
ALT: 24 U/L (ref 0–35)
AST: 15 U/L (ref 0–37)
Albumin: 3.7 g/dL (ref 3.5–5.2)
Alkaline Phosphatase: 49 U/L (ref 39–117)
BUN: 14 mg/dL (ref 6–23)
CO2: 28 mEq/L (ref 19–32)
Calcium: 9.4 mg/dL (ref 8.4–10.5)
Chloride: 99 mEq/L (ref 96–112)
Creatinine, Ser: 1.05 mg/dL (ref 0.40–1.20)
GFR: 57.45 mL/min — ABNORMAL LOW (ref >60.00)
Glucose, Bld: 405 mg/dL — ABNORMAL HIGH (ref 70–99)
Potassium: 4.3 mEq/L (ref 3.5–5.1)
Sodium: 135 mEq/L (ref 135–145)
Total Bilirubin: 0.6 mg/dL (ref 0.2–1.2)
Total Protein: 7.2 g/dL (ref 6.0–8.3)

## 2022-03-05 LAB — VITAMIN B12: Vitamin B-12: 306 pg/mL (ref 211–911)

## 2022-03-05 LAB — LDL CHOLESTEROL, DIRECT: Direct LDL: 135 mg/dL

## 2022-03-05 LAB — LIPID PANEL
Cholesterol: 191 mg/dL (ref 0–200)
HDL: 39 mg/dL — ABNORMAL LOW (ref >39.00)
Total CHOL/HDL Ratio: 5
Triglycerides: 433 mg/dL — ABNORMAL HIGH (ref 0.0–149.0)

## 2022-03-05 LAB — HEMOGLOBIN A1C: Hgb A1c MFr Bld: 11.7 % — ABNORMAL HIGH (ref 4.6–6.5)

## 2022-03-06 ENCOUNTER — Other Ambulatory Visit: Payer: Self-pay

## 2022-03-06 DIAGNOSIS — Z1211 Encounter for screening for malignant neoplasm of colon: Secondary | ICD-10-CM

## 2022-03-06 NOTE — Telephone Encounter (Signed)
Ordered today 

## 2022-03-13 ENCOUNTER — Ambulatory Visit: Payer: Medicare Other | Admitting: Obstetrics and Gynecology

## 2022-03-15 ENCOUNTER — Telehealth: Payer: Self-pay | Admitting: *Deleted

## 2022-03-15 NOTE — Telephone Encounter (Signed)
Called patient in reference to her medicare AWV appt. Unable to leave message due to VM being full.  Last AWV 10/2021

## 2022-04-19 ENCOUNTER — Other Ambulatory Visit: Payer: Self-pay

## 2022-04-19 ENCOUNTER — Telehealth: Payer: Self-pay | Admitting: Internal Medicine

## 2022-04-19 MED ORDER — RYBELSUS 7 MG PO TABS: 7.0000 mg | ORAL_TABLET | Freq: Every day | ORAL | 3 refills | Status: DC

## 2022-04-19 NOTE — Telephone Encounter (Signed)
Sent in today 

## 2022-04-19 NOTE — Telephone Encounter (Signed)
Patient needs her rybelsus refilled and sent to [facility]  Last visit:  03/04/2022  Next visit:  06/03/2022

## 2022-04-22 ENCOUNTER — Telehealth: Payer: Self-pay | Admitting: Internal Medicine

## 2022-04-22 ENCOUNTER — Other Ambulatory Visit: Payer: Self-pay

## 2022-04-22 MED ORDER — TIZANIDINE HCL 4 MG PO TABS: 4.0000 mg | ORAL_TABLET | Freq: Four times a day (QID) | ORAL | 1 refills | Status: DC | PRN

## 2022-04-22 MED ORDER — PREGABALIN 75 MG PO CAPS: 75.0000 mg | ORAL_CAPSULE | Freq: Two times a day (BID) | ORAL | 0 refills | Status: DC

## 2022-04-22 MED ORDER — BUPROPION HCL ER (XL) 150 MG PO TB24: 150.0000 mg | ORAL_TABLET | Freq: Every day | ORAL | 2 refills | Status: DC

## 2022-04-22 NOTE — Telephone Encounter (Signed)
Caller & Relationship to patient: [facility]  Call Back Number:  Last visit:  03/04/2022   Next visit:  06/03/2022  Medication(s) to be Refilled: PREGABALIN 75 MG, take 2 times per day Last: 9/11 # 60 zero refills TIZANIDINE 4 MG take one every 4 hours prn Last: 03/04/22, #90. Zero refills BUPROPION 150 MG XL take one daily Last: 03/04/22. # 902 zero refills  ALL were sent to [facility] previously. [place] said they faxed requests with no response.  Preferred Pharmacy: [facility] requesting provider fax them all 3 prescriptions  ** Please notify patient to allow 48-72 hours to process** **Let patient know to contact pharmacy at the end of the day to make sure medication is ready. ** **If patient has not been seen in a year or longer, book an appointment **Advise to use MyChart for refill requests OR to contact their pharmacy

## 2022-04-29 ENCOUNTER — Other Ambulatory Visit: Payer: Self-pay

## 2022-04-29 MED ORDER — PREGABALIN 75 MG PO CAPS: 75.0000 mg | ORAL_CAPSULE | Freq: Two times a day (BID) | ORAL | 0 refills | Status: DC

## 2022-04-29 MED ORDER — BUPROPION HCL ER (XL) 150 MG PO TB24: 150.0000 mg | ORAL_TABLET | Freq: Every day | ORAL | 2 refills | Status: DC

## 2022-04-29 MED ORDER — TIZANIDINE HCL 4 MG PO TABS: 4.0000 mg | ORAL_TABLET | Freq: Four times a day (QID) | ORAL | 1 refills | Status: DC | PRN

## 2022-04-29 NOTE — Telephone Encounter (Addendum)
[facility] called back and said they didn't receive and asked if we could resend. Call back if needed is [phone]

## 2022-04-29 NOTE — Telephone Encounter (Signed)
sent 

## 2022-04-29 NOTE — Addendum Note (Signed)
Addended by: Binnie Rail on: 04/29/2022 04:22 PM   Modules accepted: Orders

## 2022-05-06 ENCOUNTER — Emergency Department (HOSPITAL_COMMUNITY): Payer: No Typology Code available for payment source

## 2022-05-06 ENCOUNTER — Encounter (HOSPITAL_COMMUNITY): Payer: Self-pay

## 2022-05-06 ENCOUNTER — Emergency Department (HOSPITAL_COMMUNITY)
Admission: EM | Admit: 2022-05-06 | Discharge: 2022-05-06 | Disposition: A | Discharge status: Home / Self Care | Payer: No Typology Code available for payment source | Attending: Emergency Medicine | Admitting: Emergency Medicine

## 2022-05-06 DIAGNOSIS — F1410 Cocaine abuse, uncomplicated: Secondary | ICD-10-CM | POA: Insufficient documentation

## 2022-05-06 DIAGNOSIS — Z8249 Family history of ischemic heart disease and other diseases of the circulatory system: Secondary | ICD-10-CM | POA: Insufficient documentation

## 2022-05-06 DIAGNOSIS — I252 Old myocardial infarction: Secondary | ICD-10-CM | POA: Diagnosis not present

## 2022-05-06 DIAGNOSIS — R9431 Abnormal electrocardiogram [ECG] [EKG]: Secondary | ICD-10-CM | POA: Diagnosis not present

## 2022-05-06 DIAGNOSIS — R29818 Other symptoms and signs involving the nervous system: Secondary | ICD-10-CM | POA: Diagnosis not present

## 2022-05-06 DIAGNOSIS — Z7984 Long term (current) use of oral hypoglycemic drugs: Secondary | ICD-10-CM | POA: Diagnosis not present

## 2022-05-06 DIAGNOSIS — I10 Essential (primary) hypertension: Secondary | ICD-10-CM | POA: Diagnosis not present

## 2022-05-06 DIAGNOSIS — Z79899 Other long term (current) drug therapy: Secondary | ICD-10-CM | POA: Diagnosis not present

## 2022-05-06 DIAGNOSIS — G4733 Obstructive sleep apnea (adult) (pediatric): Secondary | ICD-10-CM | POA: Diagnosis not present

## 2022-05-06 DIAGNOSIS — R29810 Facial weakness: Secondary | ICD-10-CM | POA: Diagnosis not present

## 2022-05-06 DIAGNOSIS — G510 Bell's palsy: Secondary | ICD-10-CM | POA: Diagnosis not present

## 2022-05-06 DIAGNOSIS — G519 Disorder of facial nerve, unspecified: Secondary | ICD-10-CM | POA: Diagnosis not present

## 2022-05-06 DIAGNOSIS — E1165 Type 2 diabetes mellitus with hyperglycemia: Secondary | ICD-10-CM | POA: Diagnosis not present

## 2022-05-06 LAB — DIFFERENTIAL
Abs Immature Granulocytes: 0 10*3/uL (ref 0.00–0.07)
Basophils Absolute: 0.4 10*3/uL — ABNORMAL HIGH (ref 0.0–0.1)
Basophils Relative: 3 %
Eosinophils Absolute: 0.1 10*3/uL (ref 0.0–0.5)
Eosinophils Relative: 1 %
Lymphocytes Relative: 22 %
Lymphs Abs: 2.9 10*3/uL (ref 0.7–4.0)
Monocytes Absolute: 0.8 10*3/uL (ref 0.1–1.0)
Monocytes Relative: 6 %
Neutro Abs: 9.1 10*3/uL — ABNORMAL HIGH (ref 1.7–7.7)
Neutrophils Relative %: 68 %
nRBC: 0 /100 WBC

## 2022-05-06 LAB — PROTIME-INR
INR: 1 (ref 0.8–1.2)
Prothrombin Time: 13.2 seconds (ref 11.4–15.2)

## 2022-05-06 LAB — COMPREHENSIVE METABOLIC PANEL
ALT: 23 U/L (ref 0–44)
AST: 14 U/L — ABNORMAL LOW (ref 15–41)
Albumin: 3.7 g/dL (ref 3.5–5.0)
Alkaline Phosphatase: 41 U/L (ref 38–126)
Anion gap: 13 (ref 5–15)
BUN: 15 mg/dL (ref 8–23)
CO2: 20 mmol/L — ABNORMAL LOW (ref 22–32)
Calcium: 9.4 mg/dL (ref 8.9–10.3)
Chloride: 105 mmol/L (ref 98–111)
Creatinine, Ser: 0.82 mg/dL (ref 0.44–1.00)
GFR, Estimated: >60 mL/min (ref >60)
Glucose, Bld: 234 mg/dL — ABNORMAL HIGH (ref 70–99)
Potassium: 4.4 mmol/L (ref 3.5–5.1)
Sodium: 138 mmol/L (ref 135–145)
Total Bilirubin: 0.9 mg/dL (ref 0.3–1.2)
Total Protein: 7.6 g/dL (ref 6.5–8.1)

## 2022-05-06 LAB — CBG MONITORING, ED: Glucose-Capillary: 233 mg/dL — ABNORMAL HIGH (ref 70–99)

## 2022-05-06 LAB — CBC
HCT: 50.9 % — ABNORMAL HIGH (ref 36.0–46.0)
Hemoglobin: 16.7 g/dL — ABNORMAL HIGH (ref 12.0–15.0)
MCH: 30.2 pg (ref 26.0–34.0)
MCHC: 32.8 g/dL (ref 30.0–36.0)
MCV: 92 fL (ref 80.0–100.0)
Platelets: 264 10*3/uL (ref 150–400)
RBC: 5.53 MIL/uL — ABNORMAL HIGH (ref 3.87–5.11)
RDW: 13 % (ref 11.5–15.5)
WBC: 13.4 10*3/uL — ABNORMAL HIGH (ref 4.0–10.5)
nRBC: 0 % (ref 0.0–0.2)

## 2022-05-06 LAB — I-STAT CHEM 8, ED
BUN: 17 mg/dL (ref 8–23)
Calcium, Ion: 1.05 mmol/L — ABNORMAL LOW (ref 1.15–1.40)
Chloride: 107 mmol/L (ref 98–111)
Creatinine, Ser: 0.7 mg/dL (ref 0.44–1.00)
Glucose, Bld: 245 mg/dL — ABNORMAL HIGH (ref 70–99)
HCT: 51 % — ABNORMAL HIGH (ref 36.0–46.0)
Hemoglobin: 17.3 g/dL — ABNORMAL HIGH (ref 12.0–15.0)
Potassium: 4.3 mmol/L (ref 3.5–5.1)
Sodium: 138 mmol/L (ref 135–145)
TCO2: 22 mmol/L (ref 22–32)

## 2022-05-06 LAB — APTT: aPTT: 25 seconds (ref 24–36)

## 2022-05-06 LAB — ETHANOL: Alcohol, Ethyl (B): <10 mg/dL (ref <10)

## 2022-05-06 MED ORDER — LORAZEPAM 2 MG/ML IJ SOLN
1.0000 mg | INTRAMUSCULAR | Status: DC | PRN
  Administered 2022-05-06: 1 mg via INTRAVENOUS
  Filled 2022-05-06: qty 1

## 2022-05-06 MED ORDER — DEXAMETHASONE SODIUM PHOSPHATE 10 MG/ML IJ SOLN
10.0000 mg | Freq: Once | INTRAMUSCULAR | Status: AC
  Administered 2022-05-06: 10 mg via INTRAVENOUS
  Filled 2022-05-06: qty 1

## 2022-05-06 MED ORDER — PREDNISONE 20 MG PO TABS: 40.0000 mg | ORAL_TABLET | Freq: Every day | ORAL | 0 refills | Status: AC

## 2022-05-06 MED ORDER — KETOROLAC TROMETHAMINE 30 MG/ML IJ SOLN
15.0000 mg | Freq: Once | INTRAMUSCULAR | Status: AC
  Administered 2022-05-06: 15 mg via INTRAVENOUS
  Filled 2022-05-06: qty 1

## 2022-05-06 MED ORDER — SODIUM CHLORIDE 0.9% FLUSH: 3.0000 mL | Freq: Once | INTRAVENOUS | Status: DC

## 2022-05-06 MED ORDER — SODIUM CHLORIDE 0.9 % IV BOLUS
1000.0000 mL | Freq: Once | INTRAVENOUS | Status: AC
  Administered 2022-05-06: 1000 mL via INTRAVENOUS

## 2022-05-06 MED ORDER — VALACYCLOVIR HCL 1 G PO TABS: 1000.0000 mg | ORAL_TABLET | Freq: Three times a day (TID) | ORAL | 0 refills | Status: DC

## 2022-05-06 NOTE — Code Documentation (Signed)
Stroke Response Nurse Documentation Code Documentation  Monica Welch is a 61 y.o. female arriving to [facility]  via [facility] on 05/06/22 with past medical hx of anxiety, depression, DM, HTN, use of crack. On No antithrombotic. Code stroke was activated by EMS.   Patient from home where she was LKW at 1330 per a friend and now complaining of left sided facial droop, unable to close left eye, slurred speech. Pt states she got up and noticed a feeling funny on her left face and inability to close her left eye. She claimed she smoked crack this am and does so daily. Her friend said she did not see any facial droop earlier in the day but around 1330 noticed it. EMS was called and brought pt to ED. BP 187/103, CBG in the 200's.   Stroke team at the bedside on patient arrival. Labs drawn and patient cleared for CT by Dr. Jeraldine Loots. Patient to CT with team. NIHSS 3, see documentation for details and code stroke times. Patient with left facial droop and dysarthria  on exam. The following imaging was completed:  CT Head. Patient is not a candidate for IV Thrombolytic due to no stroke suspected. Patient is not a candidate for IR due to no stroke suspected.   Care Plan: MRI, Q2 NIHSS/vitals until MRI complete.   Bedside handoff with ED RN Aundra Millet.    Aitan Rossbach, [place] Scrape  Stroke Response RN

## 2022-05-06 NOTE — ED Provider Notes (Signed)
[facility] EMERGENCY DEPARTMENT Provider Note   CSN: [id number] Arrival date & time: 05/06/22  1532  An emergency department physician performed an initial assessment on this suspected stroke patient at 1532.  History  Chief Complaint  Patient presents with   Code Stroke    Monica Welch is a 61 y.o. female.  HPI Patient presented to the hospital as a code stroke via EMS.  History is obtained by the patient and EMS individuals.  Last seen normal was about 4 hours prior to my evaluation.  Patient has multiple medical issues, but was in her usual state of health until she developed left facial droop and difficulty with closing her left eye.  She notes that in the hours prior to this she was using crack cocaine.  Denies other weakness, pain beyond mild headache, or other complaints.  EMS reports no hemodynamic instability in route.    Home Medications Prior to Admission medications   Medication Sig Start Date End Date Taking? Authorizing Provider  predniSONE (DELTASONE) 20 MG tablet Take 2 tablets (40 mg total) by mouth daily with breakfast for 7 days. For the next four days 05/06/22 05/13/22 Yes Carmin Muskrat, MD  valACYclovir (VALTREX) 1000 MG tablet Take 1 tablet (1,000 mg total) by mouth 3 (three) times daily. 05/06/22  Yes Carmin Muskrat, MD  atorvastatin (LIPITOR) 20 MG tablet Take 1 tablet (20 mg total) by mouth daily. 03/04/22   Binnie Rail, MD  blood glucose meter kit and supplies KIT Dispense based on patient and insurance preference. Use up to 3 times daily as directed. (FOR E11.9). 03/04/22   Binnie Rail, MD  buPROPion (WELLBUTRIN XL) 150 MG 24 hr tablet Take 1 tablet (150 mg total) by mouth daily. 04/29/22   Binnie Rail, MD  DULoxetine (CYMBALTA) 60 MG capsule Take 1 capsule (60 mg total) by mouth daily. 03/04/22   Binnie Rail, MD  glipiZIDE (GLUCOTROL) 10 MG tablet Take 1 tablet (10 mg total) by mouth 2 (two) times daily before a meal.  03/04/22   Burns, Claudina Lick, MD  Lancets Micro Thin 33G MISC CHECK BLOOD SUGAR UP TO 3 TIMES PER DAY. E11.9 03/04/22   Binnie Rail, MD  loratadine (CLARITIN) 10 MG tablet Take 1 tablet (10 mg total) by mouth daily. 01/30/22   Binnie Rail, MD  metFORMIN (GLUCOPHAGE) 500 MG tablet Take 1 tablet (500 mg total) by mouth 2 (two) times daily with a meal. 03/04/22   Burns, Claudina Lick, MD  Multiple Vitamin (MULTIVITAMIN) tablet Take 1 tablet by mouth daily.    [provider]  nystatin (MYCOSTATIN/NYSTOP) powder Apply 1 Application topically 3 (three) times daily. 03/04/22   Burns, Claudina Lick, MD  omeprazole (PRILOSEC) 40 MG capsule TAKE 1 CAPSULE(40 MG) BY MOUTH DAILY - TAKE 30 MINUTES PRIOR TO A MEAL 03/04/22   Burns, Claudina Lick, MD  pregabalin (LYRICA) 75 MG capsule Take 1 capsule (75 mg total) by mouth 2 (two) times daily. 04/29/22   Burns, Claudina Lick, MD  Semaglutide (RYBELSUS) 7 MG TABS Take 7 mg by mouth daily. 04/19/22   Binnie Rail, MD  tiZANidine (ZANAFLEX) 4 MG tablet Take 1 tablet (4 mg total) by mouth every 6 (six) hours as needed for muscle spasms. 04/29/22   Binnie Rail, MD      Allergies    Iohexol, Gabapentin, and Iodine    Review of Systems   Review of Systems  All other systems reviewed  and are negative.   Physical Exam Updated Vital Signs BP 120/82   Pulse 80   Temp 98 F (36.7 C) (Oral)   Resp (!) 21   Wt 86.4 kg   SpO2 93%   BMI 34.84 kg/m  Physical Exam Vitals and nursing note reviewed.  Constitutional:      General: She is not in acute distress.    Appearance: She is well-developed.  HENT:     Head: Normocephalic and atraumatic.  Eyes:     Conjunctiva/sclera: Conjunctivae normal.  Cardiovascular:     Rate and Rhythm: Normal rate and regular rhythm.  Pulmonary:     Effort: Pulmonary effort is normal. No respiratory distress.     Breath sounds: Normal breath sounds. No stridor.  Abdominal:     General: There is no distension.  Skin:    General: Skin is warm  and dry.  Neurological:     Mental Status: She is alert and oriented to person, place, and time.     Cranial Nerves: Cranial nerve deficit and facial asymmetry present. No dysarthria.     Motor: No tremor or abnormal muscle tone.     Coordination: Coordination normal.     Comments: Left facial droop.  Psychiatric:        Mood and Affect: Mood normal.     ED Results / Procedures / Treatments   Labs (all labs ordered are listed, but only abnormal results are displayed) Labs Reviewed  CBC - Abnormal; Notable for the following components:      Result Value   WBC 13.4 (*)    RBC 5.53 (*)    Hemoglobin 16.7 (*)    HCT 50.9 (*)    All other components within normal limits  DIFFERENTIAL - Abnormal; Notable for the following components:   Neutro Abs 9.1 (*)    Basophils Absolute 0.4 (*)    All other components within normal limits  COMPREHENSIVE METABOLIC PANEL - Abnormal; Notable for the following components:   CO2 20 (*)    Glucose, Bld 234 (*)    AST 14 (*)    All other components within normal limits  I-STAT CHEM 8, ED - Abnormal; Notable for the following components:   Glucose, Bld 245 (*)    Calcium, Ion 1.05 (*)    Hemoglobin 17.3 (*)    HCT 51.0 (*)    All other components within normal limits  CBG MONITORING, ED - Abnormal; Notable for the following components:   Glucose-Capillary 233 (*)    All other components within normal limits  PROTIME-INR  APTT  ETHANOL    EKG EKG Interpretation  Date/Time:  Monday May 06 2022 15:53:27 EST Ventricular Rate:  73 PR Interval:  172 QRS Duration: 82 QT Interval:  377 QTC Calculation: 416 R Axis:   -22 Text Interpretation: Sinus rhythm Inferior infarct, old Anterior infarct, old Abnormal ECG Confirmed by Carmin Muskrat [phone]) on 05/06/2022 6:56:48 PM  Radiology MR BRAIN WO CONTRAST  Result Date: 05/06/2022 CLINICAL DATA:  Neuro deficit, acute, stroke suspected. Possible pontine CVA. EXAM: MRI HEAD WITHOUT  CONTRAST TECHNIQUE: Multiplanar, multiecho pulse sequences of the brain and surrounding structures were obtained without intravenous contrast. COMPARISON:  CT head without contrast 05/06/2022. FINDINGS: Brain: No acute infarct, hemorrhage, or mass lesion is present. No significant white matter lesions are present. Deep brain nuclei are within normal limits. The ventricles are of normal size. No significant extraaxial fluid collection is present. The internal auditory canals are within normal  limits. The brainstem and cerebellum are within normal limits. Vascular: Flow is present in the major intracranial arteries. Skull and upper cervical spine: The craniocervical junction is normal. Diffuse decreased T1 marrow signal is present. No discrete lesions are present. Grade 1 anterolisthesis is present at C3-4. Sinuses/Orbits: Is The paranasal sinuses and mastoid air cells are clear. The globes and orbits are within normal limits. IMPRESSION: 1. Normal MRI appearance of the brain. No acute or focal lesion to explain the patient's symptoms. 2. Diffuse decreased T1 marrow signal. This likely reflects the sequela of chronic anemia or hypoxia. Electronically Signed   By: San Morelle M.D.   On: 05/06/2022 18:40   CT HEAD CODE STROKE WO CONTRAST  Result Date: 05/06/2022 CLINICAL DATA:  Code stroke.  Neuro deficit, acute, stroke suspected EXAM: CT HEAD WITHOUT CONTRAST TECHNIQUE: Contiguous axial images were obtained from the base of the skull through the vertex without intravenous contrast. RADIATION DOSE REDUCTION: This exam was performed according to the departmental dose-optimization program which includes automated exposure control, adjustment of the mA and/or kV according to patient size and/or use of iterative reconstruction technique. COMPARISON:  None Available. FINDINGS: Brain: No acute infarct, hemorrhage, or mass lesion is present. No significant white matter lesions are present. Deep brain nuclei are  within normal limits. The ventricles are of normal size. No significant extraaxial fluid collection is present. The craniocervical junction is normal. Upper cervical spine is within normal limits. Marrow signal is unremarkable. Vascular: No hyperdense vessel or unexpected calcification. Skull: Calvarium is intact. No focal lytic or blastic lesions are present. No significant extracranial soft tissue lesion is present. Sinuses/Orbits: The paranasal sinuses and mastoid air cells are clear. The globes and orbits are within normal limits. ASPECTS [facility] Stroke Program Early CT Score) - Ganglionic level infarction (caudate, lentiform nuclei, internal capsule, insula, M1-M3 cortex): 7/7 - Supraganglionic infarction (M4-M6 cortex): 3/3 Total score (0-10 with 10 being normal): 10/10 IMPRESSION: 1. Negative CT of the head. 2. Aspects is 10/10. The above was relayed via text pager to [facility] on 05/06/2022 at 15:51 . Electronically Signed   By: San Morelle M.D.   On: 05/06/2022 15:51    Procedures Procedures    Medications Ordered in ED Medications  sodium chloride flush (NS) 0.9 % injection 3 mL (3 mLs Intravenous Not Given 05/06/22 1600)  LORazepam (ATIVAN) injection 1 mg (1 mg Intravenous Given 05/06/22 1656)  sodium chloride 0.9 % bolus 1,000 mL (0 mLs Intravenous Stopped 05/06/22 1742)  ketorolac (TORADOL) 30 MG/ML injection 15 mg (15 mg Intravenous Given 05/06/22 1630)  dexamethasone (DECADRON) injection 10 mg (10 mg Intravenous Given 05/06/22 1630)    ED Course/ Medical Decision Making/ A&P                           Medical Decision Making Patient with multiple medical issues, including substance abuse presents with acute onset facial asymmetry. Differential includes stroke, anoxic injury from drug use, Bell's palsy.  Amount and/or Complexity of Data Reviewed Independent Historian: EMS Labs: ordered. Decision-making details documented in ED Course. Radiology: ordered.  Decision-making details documented in ED Course. ECG/medicine tests:  Decision-making details documented in ED Course. Discussion of management or test interpretation with external provider(s): Case discussed with and managed with our neurology colleagues throughout, including coordination about imaging studies.  Patient's evaluation generally reassuring, inconsistent with CVA more consistent with Bell's palsy.  Patient's labs essentially unremarkable.  Head CT, MRI both reviewed,  both not consistent with stroke.  ECG nonischemic, not arrhythmic, and the patient was monitored for hours, with essentially sinus rhythm, rate 80s, unremarkable and no hypoxia, pulse ox 95% room air normal.  Hospitalization was a consideration given the new neurodeficit, but given the reassuring findings, suspicion for Bell's palsy, patient discharged with outpatient follow-up.  Risk Prescription drug management. Decision regarding hospitalization.           Final Clinical Impression(s) / ED Diagnoses Final diagnoses:  Facial nerve palsy    Rx / DC Orders ED Discharge Orders          Ordered    valACYclovir (VALTREX) 1000 MG tablet  3 times daily        05/06/22 1849    predniSONE (DELTASONE) 20 MG tablet  Daily with breakfast        05/06/22 1849              Carmin Muskrat, MD 05/06/22 1913

## 2022-05-06 NOTE — Consult Note (Signed)
Neurology Consultation  Reason for Consult: Code stroke Referring Physician: Dr. Vanita Panda   CC: left facial droop and inability to close eye  History is obtained from:patient   HPI: Monica Welch is a 61 y.o. female with past medical history of anxiety and depression, DM, HTN, HLD, migraines, OSA and crack abuse who presents to the [facility] ed via [facility] for acute onset of left facial droop and inability to close her left eye. Code stroke called by EMS.  Per patient at 0900 her left side of her face felt funny and could not close her left eye. She was with a friend at 1330 and was normal and suddenly developed left facial droop.  Patient is tearful at the bridge. She endorses smoking crack this morning and smokes everyday.  CT head no acute process, ASPECTS 10.    LKW: 1330 IV thrombolysis given?: no, not a stroke  Premorbid modified Rankin scale (mRS):  0-Completely asymptomatic and back to baseline post-stroke  ROS: Full ROS was performed and is negative except as noted in the HPI.    Past Medical History:  Diagnosis Date   Anxiety    Depression    Diabetes mellitus without complication ([place])    Endometriosis    Hypercholesteremia    Migraine    OSA (obstructive sleep apnea) 02/29/2016     Family History  Problem Relation Age of Onset   Hypertension Mother    Diabetes Mother    Kidney disease Mother    Hypertension Father    Heart disease Father    Diabetes Sister    Hypertension Sister    Kidney disease Sister    Diabetes Brother      Social History:   reports that she has never smoked. She has never used smokeless tobacco. She reports that she does not drink alcohol and does not use drugs.  Medications  Current Facility-Administered Medications:    LORazepam (ATIVAN) injection 1 mg, 1 mg, Intravenous, Q1H PRN, Carmin Muskrat, MD, 1 mg at 05/06/22 1656   sodium chloride flush (NS) 0.9 % injection 3 mL, 3 mL, Intravenous, Once, Carmin Muskrat, MD  Current  Outpatient Medications:    atorvastatin (LIPITOR) 20 MG tablet, Take 1 tablet (20 mg total) by mouth daily., Disp: 90 tablet, Rfl: 1   blood glucose meter kit and supplies KIT, Dispense based on patient and insurance preference. Use up to 3 times daily as directed. (FOR E11.9)., Disp: 1 each, Rfl: 0   buPROPion (WELLBUTRIN XL) 150 MG 24 hr tablet, Take 1 tablet (150 mg total) by mouth daily., Disp: 90 tablet, Rfl: 2   DULoxetine (CYMBALTA) 60 MG capsule, Take 1 capsule (60 mg total) by mouth daily., Disp: 90 capsule, Rfl: 1   glipiZIDE (GLUCOTROL) 10 MG tablet, Take 1 tablet (10 mg total) by mouth 2 (two) times daily before a meal., Disp: 180 tablet, Rfl: 1   Lancets Micro Thin 33G MISC, CHECK BLOOD SUGAR UP TO 3 TIMES PER DAY. E11.9, Disp: 100 each, Rfl: 5   loratadine (CLARITIN) 10 MG tablet, Take 1 tablet (10 mg total) by mouth daily., Disp: 90 tablet, Rfl: 1   metFORMIN (GLUCOPHAGE) 500 MG tablet, Take 1 tablet (500 mg total) by mouth 2 (two) times daily with a meal., Disp: 180 tablet, Rfl: 1   Multiple Vitamin (MULTIVITAMIN) tablet, Take 1 tablet by mouth daily., Disp: , Rfl:    nystatin (MYCOSTATIN/NYSTOP) powder, Apply 1 Application topically 3 (three) times daily., Disp: 60 g, Rfl: 2  omeprazole (PRILOSEC) 40 MG capsule, TAKE 1 CAPSULE(40 MG) BY MOUTH DAILY - TAKE 30 MINUTES PRIOR TO A MEAL, Disp: 90 capsule, Rfl: 1   pregabalin (LYRICA) 75 MG capsule, Take 1 capsule (75 mg total) by mouth 2 (two) times daily., Disp: 180 capsule, Rfl: 0   Semaglutide (RYBELSUS) 7 MG TABS, Take 7 mg by mouth daily., Disp: 90 tablet, Rfl: 3   tiZANidine (ZANAFLEX) 4 MG tablet, Take 1 tablet (4 mg total) by mouth every 6 (six) hours as needed for muscle spasms., Disp: 90 tablet, Rfl: 1   Exam: Current vital signs: BP 123/80   Pulse 77   Temp 98 F (36.7 C) (Oral)   Resp 14   Wt 86.4 kg   SpO2 94%   BMI 34.84 kg/m  Vital signs in last 24 hours: Temp:  [98 F (36.7 C)] 98 F (36.7 C) (11/13  1553) Pulse Rate:  [77-100] 77 (11/13 1645) Resp:  [0-14] 14 (11/13 1645) BP: (123-140)/(80-91) 123/80 (11/13 1645) SpO2:  [94 %-95 %] 94 % (11/13 1645) Weight:  [86.4 kg] 86.4 kg (11/13 1500)  GENERAL: Awake, alert. She is anxious and crying HEENT: - Normocephalic and atraumatic, dry mm LUNGS - Clear to auscultation bilaterally with no wheezes CV - S1S2 RRR, no m/r/g, equal pulses bilaterally. ABDOMEN - Soft, nontender, nondistended with normoactive BS Ext: warm, well perfused, intact peripheral pulses, no edema  NEURO:  Mental Status: AA&Ox4 Language: speech is dysarthric.  Naming, repetition, fluency, and comprehension intact. Cranial Nerves: PERRL EOMI, visual fields full, left facial asymmetry-LMN pattern, facial sensation intact, hearing intact, tongue/uvula/soft palate midline, normal sternocleidomastoid and trapezius muscle strength. No evidence of tongue atrophy or fibrillations Motor: 5/5 in all 4 extremities  Tone: is normal and bulk is normal Sensation- Intact to light touch bilaterally Coordination: FTN intact bilaterally, no ataxia in BLE. Gait- deferred  NIHSS 1a Level of Conscious.: 0 1b LOC Questions: 0 1c LOC Commands: 0 2 Best Gaze: 0 3 Visual: 0 4 Facial Palsy: 2 5a Motor Arm - left: 0 5b Motor Arm - Right: 0 6a Motor Leg - Left: 0 6b Motor Leg - Right: 0 7 Limb Ataxia: 0 8 Sensory: 0 9 Best Language: 0 10 Dysarthria: 1 11 Extinct. and Inatten.: 0 TOTAL: 3   Labs I have reviewed labs in epic and the results pertinent to this consultation are:  CBC    Component Value Date/Time   WBC 13.4 (H) 05/06/2022 1540   RBC 5.53 (H) 05/06/2022 1540   HGB 17.3 (H) 05/06/2022 1545   HCT 51.0 (H) 05/06/2022 1545   PLT 264 05/06/2022 1540   MCV 92.0 05/06/2022 1540   MCH 30.2 05/06/2022 1540   MCHC 32.8 05/06/2022 1540   RDW 13.0 05/06/2022 1540   LYMPHSABS 2.9 05/06/2022 1540   MONOABS 0.8 05/06/2022 1540   EOSABS 0.1 05/06/2022 1540   BASOSABS 0.4  (H) 05/06/2022 1540    CMP     Component Value Date/Time   NA 138 05/06/2022 1545   K 4.3 05/06/2022 1545   CL 107 05/06/2022 1545   CO2 20 (L) 05/06/2022 1540   GLUCOSE 245 (H) 05/06/2022 1545   BUN 17 05/06/2022 1545   CREATININE 0.70 05/06/2022 1545   CALCIUM 9.4 05/06/2022 1540   PROT 7.6 05/06/2022 1540   ALBUMIN 3.7 05/06/2022 1540   AST 14 (L) 05/06/2022 1540   ALT 23 05/06/2022 1540   ALKPHOS 41 05/06/2022 1540   BILITOT 0.9 05/06/2022 1540   GFRNONAA >  60 05/06/2022 1540   GFRAA 73 (L) 08/09/2013 1241    Lipid Panel     Component Value Date/Time   CHOL 191 03/04/2022 1608   TRIG (H) 03/04/2022 1608    433.0 Triglyceride is over 400; calculations on Lipids are invalid.   HDL 39.00 (L) 03/04/2022 1608   CHOLHDL 5 03/04/2022 1608   VLDL 59.4 (H) 07/21/2020 1136   LDLDIRECT 135.0 03/04/2022 1608     Imaging I have reviewed the images obtained:  CT-head- no acute process. ASPECTS 10   Assessment:   Monica Welch is a 61 y.o. female with past medical history of anxiety and depression, DM, HTN, HLD, migraines, OSA and crack abuse who presents to the [facility] ed via [facility] for acute onset of left facial droop and inability to close her left eye. Exam c/w Bell's palsy   Recommendations: - MRI Brain w/o to rule out small vessel etiology brainstem stroke given crack cocaine use. If positive for stroke will do full stroke workup, otherwise recommend acyclovir and steroids for Bells palsy  - we will follow imaging with  you.   Beulah Gandy DNP, ACNPC-AG    Attending Neurohospitalist Addendum Patient seen and examined with APP/Resident. Agree with the history and physical as documented above. Agree with the plan as documented, which I helped formulate. I have independently reviewed the chart, obtained history, review of systems and examined the patient.I have personally reviewed pertinent head/neck/spine imaging (CT/MRI). Please feel free to call with any  questions.  -- Amie Portland, MD Neurologist [facility] Pager: [phone]  Addendum MRI brain reviewed-negative for acute stroke Plan as above Please call with questions. Plan relayed to Dr. Vanita Panda  -- Amie Portland, MD Neurologist [facility] Pager: [phone]

## 2022-05-06 NOTE — Discharge Instructions (Addendum)
As discussed, today's evaluation has been generally reassuring.  Your symptoms are likely due to Bell's palsy, or facial nerve palsy.  Please follow-up with your physician or if you are unable to do so follow-up with our community [facility] wellness center.  Take all medication as directed.  Return here for concerning changes in your condition.

## 2022-05-06 NOTE — ED Triage Notes (Signed)
Pt activated as code stroke via [facility] 1523, LSW 1300. Pt w L sided facial droop, slurred speech. Per pt, reports she was "smoking crack" earlier today, reports that at 9a, unable to close L eye.   18RAC Pt A&O on arrival to ED,  neuro & stroke team at bedside on arrival 187/103

## 2022-05-06 NOTE — ED Notes (Signed)
Patient to MRI.

## 2022-06-02 ENCOUNTER — Encounter: Payer: Self-pay | Admitting: Internal Medicine

## 2022-06-02 NOTE — Patient Instructions (Addendum)
     Have a chest xray downstairs.      Medications changes include :   two antibiotics and cough syrup      Return in about 2 months (around 08/04/2022) for follow up.

## 2022-06-02 NOTE — Progress Notes (Signed)
Subjective:    Patient ID: Monica Welch, female    DOB: 12-12-1960, 61 y.o.   MRN: [id number]     HPI Monica Welch is here for follow up of her chronic medical problems, including DM, neuropathy, GERD, hld, anxiety, depression, B12 def  She recently moved and has not been taking her medication -- she misplaced them - she does have them now and can restart them.  11/13 - ED for facial nerve palsy - [facility] Palsy - rx'd prednisone and valtrex.  She states her symptoms are improving.  A1c - 12.3 here today   Symptoms started two weeks ago .  Still having them.  Taking mucinex and aleve, cough medicine. She has nasal congestion, sinus pain, sore throat, dry cough, SOB< wheeze, headaches, lightheadedness, dizziness.     Medications and allergies reviewed with patient and updated if appropriate.  Current Outpatient Medications on File Prior to Visit  Medication Sig Dispense Refill   atorvastatin (LIPITOR) 20 MG tablet Take 1 tablet (20 mg total) by mouth daily. (Patient not taking: Reported on 06/03/2022) 90 tablet 1   blood glucose meter kit and supplies KIT Dispense based on patient and insurance preference. Use up to 3 times daily as directed. (FOR E11.9). (Patient not taking: Reported on 06/03/2022) 1 each 0   buPROPion (WELLBUTRIN XL) 150 MG 24 hr tablet Take 1 tablet (150 mg total) by mouth daily. (Patient not taking: Reported on 06/03/2022) 90 tablet 2   DULoxetine (CYMBALTA) 60 MG capsule Take 1 capsule (60 mg total) by mouth daily. (Patient not taking: Reported on 06/03/2022) 90 capsule 1   glipiZIDE (GLUCOTROL) 10 MG tablet Take 1 tablet (10 mg total) by mouth 2 (two) times daily before a meal. (Patient not taking: Reported on 06/03/2022) 180 tablet 1   Lancets Micro Thin 33G MISC CHECK BLOOD SUGAR UP TO 3 TIMES PER DAY. E11.9 (Patient not taking: Reported on 06/03/2022) 100 each 5   loratadine (CLARITIN) 10 MG tablet Take 1 tablet (10 mg total) by mouth daily. (Patient not  taking: Reported on 06/03/2022) 90 tablet 1   metFORMIN (GLUCOPHAGE) 500 MG tablet Take 1 tablet (500 mg total) by mouth 2 (two) times daily with a meal. (Patient not taking: Reported on 06/03/2022) 180 tablet 1   Multiple Vitamin (MULTIVITAMIN) tablet Take 1 tablet by mouth daily. (Patient not taking: Reported on 06/03/2022)     nystatin (MYCOSTATIN/NYSTOP) powder Apply 1 Application topically 3 (three) times daily. (Patient not taking: Reported on 06/03/2022) 60 g 2   omeprazole (PRILOSEC) 40 MG capsule TAKE 1 CAPSULE(40 MG) BY MOUTH DAILY - TAKE 30 MINUTES PRIOR TO A MEAL (Patient not taking: Reported on 06/03/2022) 90 capsule 1   pregabalin (LYRICA) 75 MG capsule Take 1 capsule (75 mg total) by mouth 2 (two) times daily. (Patient not taking: Reported on 06/03/2022) 180 capsule 0   Semaglutide (RYBELSUS) 7 MG TABS Take 7 mg by mouth daily. (Patient not taking: Reported on 06/03/2022) 90 tablet 3   tiZANidine (ZANAFLEX) 4 MG tablet Take 1 tablet (4 mg total) by mouth every 6 (six) hours as needed for muscle spasms. (Patient not taking: Reported on 06/03/2022) 90 tablet 1   valACYclovir (VALTREX) 1000 MG tablet Take 1 tablet (1,000 mg total) by mouth 3 (three) times daily. (Patient not taking: Reported on 06/03/2022) 21 tablet 0   No current facility-administered medications on file prior to visit.     Review of Systems  Constitutional:  Negative for  fever.  HENT:  Positive for congestion, sinus pain and sore throat. Negative for ear pain and postnasal drip.        Left jaw pain  Respiratory:  Positive for cough (dry - no mucus), shortness of breath and wheezing.   Gastrointestinal:  Negative for diarrhea and nausea.  Musculoskeletal:  Positive for myalgias.  Neurological:  Positive for dizziness, light-headedness and headaches.       Objective:   Vitals:   06/03/22 1508  BP: 120/70  Pulse: 74  Temp: 98.6 F (37 C)  SpO2: 93%   BP Readings from Last 3 Encounters:  06/03/22 120/70   05/06/22 120/82  03/04/22 130/74   Wt Readings from Last 3 Encounters:  06/03/22 186 lb 9.6 oz (84.6 kg)  05/06/22 190 lb 7.6 oz (86.4 kg)  03/04/22 189 lb (85.7 kg)   Body mass index is 34.13 kg/m.    Physical Exam Constitutional:      General: She is not in acute distress.    Appearance: Normal appearance. She is not ill-appearing.  HENT:     Head: Normocephalic and atraumatic.     Right Ear: Tympanic membrane, ear canal and external ear normal.     Left Ear: Tympanic membrane, ear canal and external ear normal.     Mouth/Throat:     Mouth: Mucous membranes are moist.     Pharynx: No oropharyngeal exudate or posterior oropharyngeal erythema.  Eyes:     Conjunctiva/sclera: Conjunctivae normal.  Cardiovascular:     Rate and Rhythm: Normal rate and regular rhythm.  Pulmonary:     Effort: Pulmonary effort is normal. No respiratory distress.     Breath sounds: Normal breath sounds. No wheezing or rales.  Musculoskeletal:     Cervical back: Neck supple. No tenderness.  Lymphadenopathy:     Cervical: No cervical adenopathy.  Skin:    General: Skin is warm and dry.  Neurological:     Mental Status: She is alert.        Lab Results  Component Value Date   WBC 13.4 (H) 05/06/2022   HGB 17.3 (H) 05/06/2022   HCT 51.0 (H) 05/06/2022   PLT 264 05/06/2022   GLUCOSE 245 (H) 05/06/2022   CHOL 191 03/04/2022   TRIG (H) 03/04/2022    433.0 Triglyceride is over 400; calculations on Lipids are invalid.   HDL 39.00 (L) 03/04/2022   LDLDIRECT 135.0 03/04/2022   ALT 23 05/06/2022   AST 14 (L) 05/06/2022   NA 138 05/06/2022   K 4.3 05/06/2022   CL 107 05/06/2022   CREATININE 0.70 05/06/2022   BUN 17 05/06/2022   CO2 20 (L) 05/06/2022   TSH 1.25 03/04/2022   INR 1.0 05/06/2022   HGBA1C 11.7 Repeated and verified X2. (H) 03/04/2022   MICROALBUR <0.7 03/04/2022     Assessment & Plan:    See Problem List for Assessment and Plan of chronic medical problems.

## 2022-06-03 ENCOUNTER — Ambulatory Visit (INDEPENDENT_AMBULATORY_CARE_PROVIDER_SITE_OTHER): Payer: No Typology Code available for payment source | Admitting: Internal Medicine

## 2022-06-03 ENCOUNTER — Ambulatory Visit (INDEPENDENT_AMBULATORY_CARE_PROVIDER_SITE_OTHER): Payer: No Typology Code available for payment source

## 2022-06-03 VITALS — BP 120/70 | HR 74 | Temp 98.6°F | Ht 62.0 in | Wt 186.6 lb

## 2022-06-03 DIAGNOSIS — F32A Depression, unspecified: Secondary | ICD-10-CM | POA: Diagnosis not present

## 2022-06-03 DIAGNOSIS — R0602 Shortness of breath: Secondary | ICD-10-CM | POA: Diagnosis not present

## 2022-06-03 DIAGNOSIS — R059 Cough, unspecified: Secondary | ICD-10-CM | POA: Diagnosis not present

## 2022-06-03 DIAGNOSIS — J069 Acute upper respiratory infection, unspecified: Secondary | ICD-10-CM | POA: Insufficient documentation

## 2022-06-03 DIAGNOSIS — E1142 Type 2 diabetes mellitus with diabetic polyneuropathy: Secondary | ICD-10-CM | POA: Diagnosis not present

## 2022-06-03 DIAGNOSIS — E782 Mixed hyperlipidemia: Secondary | ICD-10-CM | POA: Diagnosis not present

## 2022-06-03 DIAGNOSIS — F419 Anxiety disorder, unspecified: Secondary | ICD-10-CM

## 2022-06-03 DIAGNOSIS — E538 Deficiency of other specified B group vitamins: Secondary | ICD-10-CM

## 2022-06-03 DIAGNOSIS — K219 Gastro-esophageal reflux disease without esophagitis: Secondary | ICD-10-CM

## 2022-06-03 LAB — POCT GLYCOSYLATED HEMOGLOBIN (HGB A1C)
HbA1c POC (<> result, manual entry): 12.3 % (ref 4.0–5.6)
HbA1c, POC (controlled diabetic range): 12.3 % — AB (ref 0.0–7.0)
HbA1c, POC (prediabetic range): 12.3 % — AB (ref 5.7–6.4)
Hemoglobin A1C: 12.3 % — AB (ref 4.0–5.6)

## 2022-06-03 MED ORDER — CEFDINIR 300 MG PO CAPS: 300.0000 mg | ORAL_CAPSULE | Freq: Two times a day (BID) | ORAL | 0 refills | Status: DC

## 2022-06-03 MED ORDER — HYDROCODONE BIT-HOMATROP MBR 5-1.5 MG/5ML PO SOLN: 5.0000 mL | Freq: Three times a day (TID) | ORAL | 0 refills | Status: DC | PRN

## 2022-06-03 MED ORDER — AZITHROMYCIN 250 MG PO TABS: ORAL_TABLET | ORAL | 0 refills | Status: DC

## 2022-06-03 NOTE — Assessment & Plan Note (Signed)
Chronic Has not been on medication - will restart

## 2022-06-03 NOTE — Assessment & Plan Note (Signed)
Chronic Has not been taking medication - will restart

## 2022-06-03 NOTE — Assessment & Plan Note (Signed)
Chronic Has not been taking medication Has medication at home and will restart Stressed sugar control

## 2022-06-03 NOTE — Assessment & Plan Note (Signed)
Acute Looks moderately ill - sick for two weeks - given severity and duration concern for bacterial or atypical infection CXR today to r/o PNA Zpak, omnicef Hycodan cough syrup Continue otc medications  Rest, fluids

## 2022-06-03 NOTE — Assessment & Plan Note (Signed)
Chronic Very uncontrolled A1c is 12.3 today  She has not been taking her medication - stressed restarting all medications F/u in 2 months so we can re-evaluate her med regimen and help get her sugars controlled

## 2022-06-27 ENCOUNTER — Telehealth: Payer: Self-pay | Admitting: Internal Medicine

## 2022-06-27 NOTE — Telephone Encounter (Signed)
Left message for patient to call back to schedule Medicare Annual Wellness Visit   Last AWV  08/21/20  Please schedule at anytime with LB Alfarata if patient calls the office back.     Any questions, please call me at [phone]

## 2022-08-20 DIAGNOSIS — E119 Type 2 diabetes mellitus without complications: Secondary | ICD-10-CM | POA: Diagnosis not present

## 2022-08-20 DIAGNOSIS — H25813 Combined forms of age-related cataract, bilateral: Secondary | ICD-10-CM | POA: Diagnosis not present

## 2022-08-20 DIAGNOSIS — G510 Bell's palsy: Secondary | ICD-10-CM | POA: Diagnosis not present

## 2022-08-20 DIAGNOSIS — H40013 Open angle with borderline findings, low risk, bilateral: Secondary | ICD-10-CM | POA: Diagnosis not present

## 2022-12-16 ENCOUNTER — Encounter: Payer: Self-pay | Admitting: Internal Medicine

## 2022-12-16 NOTE — Progress Notes (Signed)
Subjective:    Patient ID: Monica Welch, female    DOB: 04-10-1961, 62 y.o.   MRN: [id number]     HPI Monica Welch is here for follow up of her chronic medical problems.    Medications and allergies reviewed with patient and updated if appropriate.  Current Outpatient Medications on File Prior to Visit  Medication Sig Dispense Refill  . atorvastatin (LIPITOR) 20 MG tablet Take 1 tablet (20 mg total) by mouth daily. (Patient not taking: Reported on 06/03/2022) 90 tablet 1  . azithromycin (ZITHROMAX) 250 MG tablet Take two tabs the first day and then one tab daily for four days 6 tablet 0  . blood glucose meter kit and supplies KIT Dispense based on patient and insurance preference. Use up to 3 times daily as directed. (FOR E11.9). (Patient not taking: Reported on 06/03/2022) 1 each 0  . buPROPion (WELLBUTRIN XL) 150 MG 24 hr tablet Take 1 tablet (150 mg total) by mouth daily. (Patient not taking: Reported on 06/03/2022) 90 tablet 2  . cefdinir (OMNICEF) 300 MG capsule Take 1 capsule (300 mg total) by mouth 2 (two) times daily. 20 capsule 0  . DULoxetine (CYMBALTA) 60 MG capsule Take 1 capsule (60 mg total) by mouth daily. (Patient not taking: Reported on 06/03/2022) 90 capsule 1  . glipiZIDE (GLUCOTROL) 10 MG tablet Take 1 tablet (10 mg total) by mouth 2 (two) times daily before a meal. (Patient not taking: Reported on 06/03/2022) 180 tablet 1  . Lancets Micro Thin 33G MISC CHECK BLOOD SUGAR UP TO 3 TIMES PER DAY. E11.9 (Patient not taking: Reported on 06/03/2022) 100 each 5  . loratadine (CLARITIN) 10 MG tablet Take 1 tablet (10 mg total) by mouth daily. (Patient not taking: Reported on 06/03/2022) 90 tablet 1  . metFORMIN (GLUCOPHAGE) 500 MG tablet Take 1 tablet (500 mg total) by mouth 2 (two) times daily with a meal. (Patient not taking: Reported on 06/03/2022) 180 tablet 1  . Multiple Vitamin (MULTIVITAMIN) tablet Take 1 tablet by mouth daily. (Patient not taking: Reported on  06/03/2022)    . omeprazole (PRILOSEC) 40 MG capsule TAKE 1 CAPSULE(40 MG) BY MOUTH DAILY - TAKE 30 MINUTES PRIOR TO A MEAL (Patient not taking: Reported on 06/03/2022) 90 capsule 1  . pregabalin (LYRICA) 75 MG capsule Take 1 capsule (75 mg total) by mouth 2 (two) times daily. (Patient not taking: Reported on 06/03/2022) 180 capsule 0  . Semaglutide (RYBELSUS) 7 MG TABS Take 7 mg by mouth daily. (Patient not taking: Reported on 06/03/2022) 90 tablet 3  . tiZANidine (ZANAFLEX) 4 MG tablet Take 1 tablet (4 mg total) by mouth every 6 (six) hours as needed for muscle spasms. (Patient not taking: Reported on 06/03/2022) 90 tablet 1   No current facility-administered medications on file prior to visit.     Review of Systems     Objective:  There were no vitals filed for this visit. BP Readings from Last 3 Encounters:  06/03/22 120/70  05/06/22 120/82  03/04/22 130/74   Wt Readings from Last 3 Encounters:  06/03/22 186 lb 9.6 oz (84.6 kg)  05/06/22 190 lb 7.6 oz (86.4 kg)  03/04/22 189 lb (85.7 kg)   There is no height or weight on file to calculate BMI.    Physical Exam     Lab Results  Component Value Date   WBC 13.4 (H) 05/06/2022   HGB 17.3 (H) 05/06/2022   HCT 51.0 (H) 05/06/2022  PLT 264 05/06/2022   GLUCOSE 245 (H) 05/06/2022   CHOL 191 03/04/2022   TRIG (H) 03/04/2022    433.0 Triglyceride is over 400; calculations on Lipids are invalid.   HDL 39.00 (L) 03/04/2022   LDLDIRECT 135.0 03/04/2022   ALT 23 05/06/2022   AST 14 (L) 05/06/2022   NA 138 05/06/2022   K 4.3 05/06/2022   CL 107 05/06/2022   CREATININE 0.70 05/06/2022   BUN 17 05/06/2022   CO2 20 (L) 05/06/2022   TSH 1.25 03/04/2022   INR 1.0 05/06/2022   HGBA1C 12.3 (A) 06/03/2022   HGBA1C 12.3 06/03/2022   HGBA1C 12.3 (A) 06/03/2022   HGBA1C 12.3 (A) 06/03/2022   MICROALBUR <0.7 03/04/2022     Assessment & Plan:    See Problem List for Assessment and Plan of chronic medical problems.    This  encounter was created in error - please disregard.

## 2022-12-16 NOTE — Patient Instructions (Signed)
      Blood work was ordered.   The lab is on the first floor.    Medications changes include :       A referral was ordered and someone will call you to schedule an appointment.     No follow-ups on file.  

## 2022-12-17 ENCOUNTER — Encounter: Payer: No Typology Code available for payment source | Admitting: Internal Medicine

## 2022-12-17 DIAGNOSIS — E782 Mixed hyperlipidemia: Secondary | ICD-10-CM

## 2022-12-17 DIAGNOSIS — K219 Gastro-esophageal reflux disease without esophagitis: Secondary | ICD-10-CM

## 2022-12-17 DIAGNOSIS — E1142 Type 2 diabetes mellitus with diabetic polyneuropathy: Secondary | ICD-10-CM

## 2022-12-17 DIAGNOSIS — E538 Deficiency of other specified B group vitamins: Secondary | ICD-10-CM

## 2022-12-17 DIAGNOSIS — F419 Anxiety disorder, unspecified: Secondary | ICD-10-CM

## 2022-12-19 NOTE — Progress Notes (Signed)
Subjective:    Patient ID: Monica Welch, female    DOB: 04-10-1961, 62 y.o.   MRN: [id number]     HPI Monica Welch is here for follow up of her chronic medical problems.    Medications and allergies reviewed with patient and updated if appropriate.  Current Outpatient Medications on File Prior to Visit  Medication Sig Dispense Refill  . atorvastatin (LIPITOR) 20 MG tablet Take 1 tablet (20 mg total) by mouth daily. (Patient not taking: Reported on 06/03/2022) 90 tablet 1  . azithromycin (ZITHROMAX) 250 MG tablet Take two tabs the first day and then one tab daily for four days 6 tablet 0  . blood glucose meter kit and supplies KIT Dispense based on patient and insurance preference. Use up to 3 times daily as directed. (FOR E11.9). (Patient not taking: Reported on 06/03/2022) 1 each 0  . buPROPion (WELLBUTRIN XL) 150 MG 24 hr tablet Take 1 tablet (150 mg total) by mouth daily. (Patient not taking: Reported on 06/03/2022) 90 tablet 2  . cefdinir (OMNICEF) 300 MG capsule Take 1 capsule (300 mg total) by mouth 2 (two) times daily. 20 capsule 0  . DULoxetine (CYMBALTA) 60 MG capsule Take 1 capsule (60 mg total) by mouth daily. (Patient not taking: Reported on 06/03/2022) 90 capsule 1  . glipiZIDE (GLUCOTROL) 10 MG tablet Take 1 tablet (10 mg total) by mouth 2 (two) times daily before a meal. (Patient not taking: Reported on 06/03/2022) 180 tablet 1  . Lancets Micro Thin 33G MISC CHECK BLOOD SUGAR UP TO 3 TIMES PER DAY. E11.9 (Patient not taking: Reported on 06/03/2022) 100 each 5  . loratadine (CLARITIN) 10 MG tablet Take 1 tablet (10 mg total) by mouth daily. (Patient not taking: Reported on 06/03/2022) 90 tablet 1  . metFORMIN (GLUCOPHAGE) 500 MG tablet Take 1 tablet (500 mg total) by mouth 2 (two) times daily with a meal. (Patient not taking: Reported on 06/03/2022) 180 tablet 1  . Multiple Vitamin (MULTIVITAMIN) tablet Take 1 tablet by mouth daily. (Patient not taking: Reported on  06/03/2022)    . omeprazole (PRILOSEC) 40 MG capsule TAKE 1 CAPSULE(40 MG) BY MOUTH DAILY - TAKE 30 MINUTES PRIOR TO A MEAL (Patient not taking: Reported on 06/03/2022) 90 capsule 1  . pregabalin (LYRICA) 75 MG capsule Take 1 capsule (75 mg total) by mouth 2 (two) times daily. (Patient not taking: Reported on 06/03/2022) 180 capsule 0  . Semaglutide (RYBELSUS) 7 MG TABS Take 7 mg by mouth daily. (Patient not taking: Reported on 06/03/2022) 90 tablet 3  . tiZANidine (ZANAFLEX) 4 MG tablet Take 1 tablet (4 mg total) by mouth every 6 (six) hours as needed for muscle spasms. (Patient not taking: Reported on 06/03/2022) 90 tablet 1   No current facility-administered medications on file prior to visit.     Review of Systems     Objective:  There were no vitals filed for this visit. BP Readings from Last 3 Encounters:  06/03/22 120/70  05/06/22 120/82  03/04/22 130/74   Wt Readings from Last 3 Encounters:  06/03/22 186 lb 9.6 oz (84.6 kg)  05/06/22 190 lb 7.6 oz (86.4 kg)  03/04/22 189 lb (85.7 kg)   There is no height or weight on file to calculate BMI.    Physical Exam     Lab Results  Component Value Date   WBC 13.4 (H) 05/06/2022   HGB 17.3 (H) 05/06/2022   HCT 51.0 (H) 05/06/2022  PLT 264 05/06/2022   GLUCOSE 245 (H) 05/06/2022   CHOL 191 03/04/2022   TRIG (H) 03/04/2022    433.0 Triglyceride is over 400; calculations on Lipids are invalid.   HDL 39.00 (L) 03/04/2022   LDLDIRECT 135.0 03/04/2022   ALT 23 05/06/2022   AST 14 (L) 05/06/2022   NA 138 05/06/2022   K 4.3 05/06/2022   CL 107 05/06/2022   CREATININE 0.70 05/06/2022   BUN 17 05/06/2022   CO2 20 (L) 05/06/2022   TSH 1.25 03/04/2022   INR 1.0 05/06/2022   HGBA1C 12.3 (A) 06/03/2022   HGBA1C 12.3 06/03/2022   HGBA1C 12.3 (A) 06/03/2022   HGBA1C 12.3 (A) 06/03/2022   MICROALBUR <0.7 03/04/2022     Assessment & Plan:    See Problem List for Assessment and Plan of chronic medical problems.    This  encounter was created in error - please disregard.

## 2022-12-19 NOTE — Patient Instructions (Signed)
      Blood work was ordered.   The lab is on the first floor.    Medications changes include :       A referral was ordered and someone will call you to schedule an appointment.     No follow-ups on file.  

## 2022-12-20 ENCOUNTER — Encounter: Payer: No Typology Code available for payment source | Admitting: Internal Medicine

## 2022-12-20 DIAGNOSIS — E1142 Type 2 diabetes mellitus with diabetic polyneuropathy: Secondary | ICD-10-CM

## 2022-12-20 DIAGNOSIS — K219 Gastro-esophageal reflux disease without esophagitis: Secondary | ICD-10-CM

## 2022-12-20 DIAGNOSIS — F419 Anxiety disorder, unspecified: Secondary | ICD-10-CM

## 2022-12-20 DIAGNOSIS — E538 Deficiency of other specified B group vitamins: Secondary | ICD-10-CM

## 2022-12-20 DIAGNOSIS — E782 Mixed hyperlipidemia: Secondary | ICD-10-CM

## 2023-04-08 ENCOUNTER — Ambulatory Visit (INDEPENDENT_AMBULATORY_CARE_PROVIDER_SITE_OTHER): Payer: Medicare PPO | Admitting: Internal Medicine

## 2023-04-08 ENCOUNTER — Encounter: Payer: Self-pay | Admitting: Internal Medicine

## 2023-04-08 VITALS — BP 120/80 | HR 82 | Temp 98.4°F | Ht 62.0 in | Wt 175.0 lb

## 2023-04-08 DIAGNOSIS — E1142 Type 2 diabetes mellitus with diabetic polyneuropathy: Secondary | ICD-10-CM | POA: Diagnosis not present

## 2023-04-08 DIAGNOSIS — Z7984 Long term (current) use of oral hypoglycemic drugs: Secondary | ICD-10-CM

## 2023-04-08 DIAGNOSIS — E782 Mixed hyperlipidemia: Secondary | ICD-10-CM

## 2023-04-08 DIAGNOSIS — Z1231 Encounter for screening mammogram for malignant neoplasm of breast: Secondary | ICD-10-CM

## 2023-04-08 DIAGNOSIS — F32A Depression, unspecified: Secondary | ICD-10-CM

## 2023-04-08 DIAGNOSIS — F419 Anxiety disorder, unspecified: Secondary | ICD-10-CM | POA: Diagnosis not present

## 2023-04-08 DIAGNOSIS — K219 Gastro-esophageal reflux disease without esophagitis: Secondary | ICD-10-CM

## 2023-04-08 DIAGNOSIS — Z23 Encounter for immunization: Secondary | ICD-10-CM | POA: Diagnosis not present

## 2023-04-08 LAB — CBC WITH DIFFERENTIAL/PLATELET
Basophils Absolute: 0.1 K/uL (ref 0.0–0.1)
Basophils Relative: 0.9 % (ref 0.0–3.0)
Eosinophils Absolute: 0.2 K/uL (ref 0.0–0.7)
Eosinophils Relative: 2.2 % (ref 0.0–5.0)
HCT: 50.1 % — ABNORMAL HIGH (ref 36.0–46.0)
Hemoglobin: 16.2 g/dL — ABNORMAL HIGH (ref 12.0–15.0)
Lymphocytes Relative: 31 % (ref 12.0–46.0)
Lymphs Abs: 3.4 K/uL (ref 0.7–4.0)
MCHC: 32.2 g/dL (ref 30.0–36.0)
MCV: 91.9 fl (ref 78.0–100.0)
Monocytes Absolute: 0.6 K/uL (ref 0.1–1.0)
Monocytes Relative: 5.7 % (ref 3.0–12.0)
Neutro Abs: 6.5 K/uL (ref 1.4–7.7)
Neutrophils Relative %: 60.2 % (ref 43.0–77.0)
Platelets: 250 K/uL (ref 150.0–400.0)
RBC: 5.45 Mil/uL — ABNORMAL HIGH (ref 3.87–5.11)
RDW: 13.1 % (ref 11.5–15.5)
WBC: 10.8 K/uL — ABNORMAL HIGH (ref 4.0–10.5)

## 2023-04-08 LAB — COMPREHENSIVE METABOLIC PANEL WITH GFR
ALT: 17 U/L (ref 0–35)
AST: 16 U/L (ref 0–37)
Albumin: 4.1 g/dL (ref 3.5–5.2)
Alkaline Phosphatase: 46 U/L (ref 39–117)
BUN: 15 mg/dL (ref 6–23)
CO2: 29 meq/L (ref 19–32)
Calcium: 9.8 mg/dL (ref 8.4–10.5)
Chloride: 102 meq/L (ref 96–112)
Creatinine, Ser: 0.89 mg/dL (ref 0.40–1.20)
GFR: 69.53 mL/min
Glucose, Bld: 203 mg/dL — ABNORMAL HIGH (ref 70–99)
Potassium: 4.1 meq/L (ref 3.5–5.1)
Sodium: 138 meq/L (ref 135–145)
Total Bilirubin: 1 mg/dL (ref 0.2–1.2)
Total Protein: 7.9 g/dL (ref 6.0–8.3)

## 2023-04-08 LAB — HEMOGLOBIN A1C: Hgb A1c MFr Bld: 12.2 % — ABNORMAL HIGH (ref 4.6–6.5)

## 2023-04-08 LAB — VITAMIN B12: Vitamin B-12: 426 pg/mL (ref 211–911)

## 2023-04-08 LAB — LIPID PANEL
Cholesterol: 220 mg/dL — ABNORMAL HIGH (ref 0–200)
HDL: 41 mg/dL
LDL Cholesterol: 136 mg/dL — ABNORMAL HIGH (ref 0–99)
NonHDL: 179.49
Total CHOL/HDL Ratio: 5
Triglycerides: 216 mg/dL — ABNORMAL HIGH (ref 0.0–149.0)
VLDL: 43.2 mg/dL — ABNORMAL HIGH (ref 0.0–40.0)

## 2023-04-08 MED ORDER — DULOXETINE HCL 60 MG PO CPEP: 60.0000 mg | ORAL_CAPSULE | Freq: Every day | ORAL | 1 refills | Status: DC

## 2023-04-08 MED ORDER — METFORMIN HCL 500 MG PO TABS: 500.0000 mg | ORAL_TABLET | Freq: Two times a day (BID) | ORAL | 1 refills | Status: DC

## 2023-04-08 MED ORDER — LANCET DEVICE MISC: 0 refills | Status: DC

## 2023-04-08 MED ORDER — LANCETS MISC. MISC: 3 refills | Status: DC

## 2023-04-08 MED ORDER — ATORVASTATIN CALCIUM 20 MG PO TABS: 20.0000 mg | ORAL_TABLET | Freq: Every day | ORAL | 1 refills | Status: DC

## 2023-04-08 MED ORDER — PREGABALIN 75 MG PO CAPS: 75.0000 mg | ORAL_CAPSULE | Freq: Two times a day (BID) | ORAL | 2 refills | Status: DC

## 2023-04-08 MED ORDER — BLOOD GLUCOSE TEST VI STRP: ORAL_STRIP | 3 refills | Status: DC

## 2023-04-08 MED ORDER — GLIPIZIDE 10 MG PO TABS: 10.0000 mg | ORAL_TABLET | Freq: Two times a day (BID) | ORAL | 1 refills | Status: DC

## 2023-04-08 MED ORDER — BLOOD GLUCOSE MONITORING SUPPL DEVI: 0 refills | Status: DC

## 2023-04-08 MED ORDER — OMEPRAZOLE 40 MG PO CPDR: DELAYED_RELEASE_CAPSULE | ORAL | 1 refills | Status: DC

## 2023-04-08 NOTE — Assessment & Plan Note (Signed)
Chronic Very uncontrolled Lab Results  Component Value Date   HGBA1C 12.2 (H) 04/08/2023   She has not been taking her medication  Restart metformin and glipizide F/u in 2 months so we can re-evaluate her med regimen and possible restart rybelsus

## 2023-04-08 NOTE — Assessment & Plan Note (Signed)
Chronic  Not controlled Restart cymbalta 60 mg daily

## 2023-04-08 NOTE — Assessment & Plan Note (Signed)
Chronic GERD controlled Continue omeprazole 40 mg daily

## 2023-04-08 NOTE — Progress Notes (Signed)
Subjective:    Patient ID: Monica Welch, female    DOB: 1960-07-08, 62 y.o.   MRN: [id number]      HPI Monica Welch is here for  Chief Complaint  Patient presents with   Balance issues    Patient having balance issues   She is not taking any medication.  She was evicted 2 weeks ago and lives here and there.  Currently living with a friend. She is trying to find housing.  Not taking most meds - she does take omeprazole daily and metformin on occasion.       She keeps falling - she loses her balance.  Her toes are numb on the right foot.  Her legs are numb.  No lightheadedness or dizziness.      Medications and allergies reviewed with patient and updated if appropriate.  Current Outpatient Medications on File Prior to Visit  Medication Sig Dispense Refill   blood glucose meter kit and supplies KIT Dispense based on patient and insurance preference. Use up to 3 times daily as directed. (FOR E11.9). 1 each 0   buPROPion (WELLBUTRIN XL) 150 MG 24 hr tablet Take 1 tablet (150 mg total) by mouth daily. 90 tablet 2   DULoxetine (CYMBALTA) 60 MG capsule Take 1 capsule (60 mg total) by mouth daily. 90 capsule 1   glipiZIDE (GLUCOTROL) 10 MG tablet Take 1 tablet (10 mg total) by mouth 2 (two) times daily before a meal. 180 tablet 1   Lancets Micro Thin 33G MISC CHECK BLOOD SUGAR UP TO 3 TIMES PER DAY. E11.9 100 each 5   loratadine (CLARITIN) 10 MG tablet Take 1 tablet (10 mg total) by mouth daily. 90 tablet 1   metFORMIN (GLUCOPHAGE) 500 MG tablet Take 1 tablet (500 mg total) by mouth 2 (two) times daily with a meal. 180 tablet 1   Multiple Vitamin (MULTIVITAMIN) tablet Take 1 tablet by mouth daily.     omeprazole (PRILOSEC) 40 MG capsule TAKE 1 CAPSULE(40 MG) BY MOUTH DAILY - TAKE 30 MINUTES PRIOR TO A MEAL 90 capsule 1   pregabalin (LYRICA) 75 MG capsule Take 1 capsule (75 mg total) by mouth 2 (two) times daily. 180 capsule 0   tiZANidine (ZANAFLEX) 4 MG tablet Take 1 tablet (4 mg  total) by mouth every 6 (six) hours as needed for muscle spasms. 90 tablet 1   atorvastatin (LIPITOR) 20 MG tablet Take 1 tablet (20 mg total) by mouth daily. (Patient not taking: Reported on 06/03/2022) 90 tablet 1   azithromycin (ZITHROMAX) 250 MG tablet Take two tabs the first day and then one tab daily for four days 6 tablet 0   Semaglutide (RYBELSUS) 7 MG TABS Take 7 mg by mouth daily. (Patient not taking: Reported on 06/03/2022) 90 tablet 3   No current facility-administered medications on file prior to visit.    Review of Systems  Constitutional:  Negative for fever.  Eyes:  Positive for photophobia (left eye) and visual disturbance (blurry vision in left eye sometimes).       Left eye wants to close - she has to keep it open  Respiratory:  Positive for cough (chronic). Negative for shortness of breath and wheezing.   Cardiovascular:  Positive for leg swelling (feet swelling). Negative for chest pain and palpitations.  Endocrine: Positive for polydipsia and polyuria.  Musculoskeletal:  Positive for arthralgias (hips sore). Negative for back pain.  Neurological:  Positive for numbness (feet and legs are numb, change in sensation  in left face) and headaches (3-4 / week). Negative for dizziness and light-headedness.       Objective:   Vitals:   04/08/23 1441  BP: (!) 142/90  Pulse: 82  Temp: 98.4 F (36.9 C)  SpO2: 97%   BP Readings from Last 3 Encounters:  04/08/23 (!) 142/90  06/03/22 120/70  05/06/22 120/82   Wt Readings from Last 3 Encounters:  04/08/23 175 lb (79.4 kg)  06/03/22 186 lb 9.6 oz (84.6 kg)  05/06/22 190 lb 7.6 oz (86.4 kg)   Body mass index is 32.01 kg/m.    Physical Exam Constitutional:      General: She is not in acute distress.    Appearance: Normal appearance.  HENT:     Head: Normocephalic and atraumatic.  Eyes:     Conjunctiva/sclera: Conjunctivae normal.  Cardiovascular:     Rate and Rhythm: Normal rate and regular rhythm.     Heart  sounds: Normal heart sounds.  Pulmonary:     Effort: Pulmonary effort is normal. No respiratory distress.     Breath sounds: Normal breath sounds. No wheezing.  Musculoskeletal:     Cervical back: Neck supple.     Right lower leg: No edema.     Left lower leg: Edema (mild in foot) present.  Lymphadenopathy:     Cervical: No cervical adenopathy.  Skin:    General: Skin is warm and dry.     Findings: No rash.  Neurological:     Mental Status: She is alert. Mental status is at baseline.  Psychiatric:        Mood and Affect: Mood normal.        Behavior: Behavior normal.          Diabetic Foot Exam - Simple   Simple Foot Form Diabetic Foot exam was performed with the following findings: Yes   Visual Inspection No deformities, no ulcerations, no other skin breakdown bilaterally: Yes Sensation Testing See comments: Yes Pulse Check Posterior Tibialis and Dorsalis pulse intact bilaterally: Yes Comments Decreased sensation b/l feet to light touch      Assessment & Plan:    See Problem List for Assessment and Plan of chronic medical problems.

## 2023-04-08 NOTE — Assessment & Plan Note (Addendum)
Chronic Has not been taking medication Discussed her balance issues and falling is likely related to her neuropathy Restart lyrica 75 mg bid Stressed sugar control

## 2023-04-08 NOTE — Assessment & Plan Note (Signed)
Chronic Has not been taking medication - will restart atorvastatin 20 mg daily Check lipids today - will recheck in 2 months

## 2023-04-08 NOTE — Patient Instructions (Addendum)
     Flu immunization administered today.     Blood work was ordered.   The lab is on the first floor.    Medications changes include :   restart most medication    A referral was ordered for [facility] and someone will call you to schedule an appointment.  They will help with community resources.      Return in about 2 months (around 06/08/2023) for follow up.

## 2023-04-09 ENCOUNTER — Telehealth: Payer: Self-pay | Admitting: *Deleted

## 2023-04-09 NOTE — Progress Notes (Unsigned)
  Care Coordination  Outreach Note  04/09/2023 Name: Monica Welch MRN: [id number] DOB: [birth year]   Care Coordination Outreach Attempts: An unsuccessful telephone outreach was attempted today to offer the patient information about available care coordination services.  Follow Up Plan:  Additional outreach attempts will be made to offer the patient care coordination information and services.   Encounter Outcome:  No Answer  Burman Nieves, CCMA Care Coordination Care Guide Direct Dial: [phone]

## 2023-04-10 NOTE — Progress Notes (Signed)
  Care Coordination   Note   04/10/2023 Name: Monica Welch MRN: [id number] DOB: 05/03/1961  Monica Welch is a 62 y.o. year old female who sees Burns, Bobette Mo, MD for primary care. I reached out to [name] by phone today to offer care coordination services.  [name] was given information about Care Coordination services today including:   The Care Coordination services include support from the care team which includes your Nurse Coordinator, Clinical Social Worker, or Pharmacist.  The Care Coordination team is here to help remove barriers to the health concerns and goals most important to you. Care Coordination services are voluntary, and the patient may decline or stop services at any time by request to their care team member.   Care Coordination Consent Status: Patient agreed to services and verbal consent obtained.   Follow up plan:  Telephone appointment with care coordination team member scheduled for:  04/14/2023  Encounter Outcome:  Patient Scheduled from referral   Burman Nieves, [facility] Care Coordination Care Guide Direct Dial: [phone]

## 2023-04-10 NOTE — Progress Notes (Signed)
  Care Coordination  Outreach Note  04/10/2023 Name: CAASI GIGLIA MRN: [id number] DOB: 09/03/1960   Care Coordination Outreach Attempts: A second unsuccessful outreach was attempted today to offer the patient with information about available care coordination services.  Follow Up Plan:  Additional outreach attempts will be made to offer the patient care coordination information and services.   Encounter Outcome:  No Answer  Burman Nieves, CCMA Care Coordination Care Guide Direct Dial: [phone]

## 2023-04-14 ENCOUNTER — Ambulatory Visit: Payer: Self-pay | Admitting: Licensed Clinical Social Worker

## 2023-04-14 NOTE — Patient Outreach (Signed)
  Care Coordination   Initial Visit Note   04/14/2023 Name: Monica Welch MRN: [id number] DOB: December 20, 1960  Monica Welch is a 62 y.o. year old female who sees Burns, Bobette Mo, MD for primary care. I spoke with  [name] by phone today.  What matters to the patients health and wellness today?  Housing assistance and food resource    Goals Addressed             This Visit's Progress    Care Coordination Activities       Care Coordination Interventions: Patient was evicted 2 weeks ago and is currently living with a friend, the patient declined the offer to stay in a shelter due to she has a pet dog that she is really close to. The patient stated that the SW can mail the list of shelters just in case things change in the future. Sw will mail the patient the housing and shelter list. The patient does receive food stamps in the amount of $120 and the patient does go to the local food pantries. The SW will mail a list of food pantries and the SW did educate the patient on the [employer].         SDOH assessments and interventions completed:  Yes  SDOH Interventions Today    Flowsheet Row Most Recent Value  SDOH Interventions   Food Insecurity Interventions Other (Comment)  [Patient recived food stamsp and it was just raised to $120 and the patient does go to local food pantires, SW will also send a list of food pantries and SW educated the patient on the [employer] [place] [employer]]  Housing Interventions Other (Comment)  [Patient evicted 2 weeks ago, currently is staying with a friend, she had applied for section 8 ,10 years ago and was approved but never was able to get in contact with anyone, now with the eviction she cant apply, patient is going to look for a place]  Transportation Interventions Intervention Not Indicated  [Patient has her own truck for transportation]  Utilities Interventions Other (Comment)  [Patient stated that when  she was evicted her power bill was behind]  Surveyor, quantity Strain Interventions Other (Comment)  [Patient is on disability and receives [phone] and her [employer] is also deducted each month, she stated that she can afford rent around $700 each month]        Care Coordination Interventions:  Yes, provided  Interventions Today    Flowsheet Row Most Recent Value  General Interventions   General Interventions Discussed/Reviewed General Interventions Discussed  [Patient was evicted 2 weeks ago and is currently living with a friend, patient declined shelter but stated that the SW can mail a list. SW educated the patient ont he [employer] app and will mail a Programme researcher, broadcasting/film/video list]  Education Interventions   Education Provided Provided Education, Provided [employer]  [Sw will mail a Environmental manager and housing shelter list, and educated the patient on the [employer] app.]  Provided Verbal Education On Nutrition, [employer]  [Food pantries, shelters and the [employer] app]        Follow up plan: Follow up call scheduled for 04/22/2023 at 1:00 pm    Encounter Outcome:  Patient Visit Completed   Jeanie Cooks, PhD [facility], [facility] Social Worker Direct Dial: [phone]  Fax: [phone]

## 2023-04-14 NOTE — Patient Instructions (Signed)
Visit Information  Thank you for taking time to visit with me today. Please don't hesitate to contact me if I can be of assistance to you.   Following are the goals we discussed today:   Goals Addressed             This Visit's Progress    Care Coordination Activities       Care Coordination Interventions: Patient was evicted 2 weeks ago and is currently living with a friend, the patient declined the offer to stay in a shelter due to she has a pet dog that she is really close to. The patient stated that the SW can mail the list of shelters just in case things change in the future. Sw will mail the patient the housing and shelter list. The patient does receive food stamps in the amount of $120 and the patient does go to the local food pantries. The SW will mail a list of food pantries and the SW did educate the patient on the [employer].         Our next appointment is by telephone on 04/22/2023 at 1:00pm  Please call the care guide team at [phone] if you need to cancel or reschedule your appointment.   If you are experiencing a Mental Health or Behavioral Health Crisis or need someone to talk to, please call the Suicide and Crisis Lifeline: 988 go to [facility] Urgent [facility] [address] [place] [phone]) call 911  Patient verbalizes understanding of instructions and care plan provided today and agrees to view in MyChart. Active MyChart status and patient understanding of how to access instructions and care plan via MyChart confirmed with patient.         Visit Information  Thank you for taking time to visit with me today. Please don't hesitate to contact me if I can be of assistance to you.   Following are the goals we discussed today:   Goals Addressed             This Visit's Progress    Care Coordination Activities       Care Coordination Interventions: Patient was evicted 2 weeks ago and is currently  living with a friend, the patient declined the offer to stay in a shelter due to she has a pet dog that she is really close to. The patient stated that the SW can mail the list of shelters just in case things change in the future. Sw will mail the patient the housing and shelter list. The patient does receive food stamps in the amount of $120 and the patient does go to the local food pantries. The SW will mail a list of food pantries and the SW did educate the patient on the [employer].         Our next appointment is by telephone on 04/22/2023 at 1:00 pm  Please call the care guide team at [phone] if you need to cancel or reschedule your appointment.   If you are experiencing a Mental Health or Behavioral Health Crisis or need someone to talk to, please call the Suicide and Crisis Lifeline: 988 go to [facility] Urgent [facility] [address], [place] [phone]) call 911  Patient verbalizes understanding of instructions and care plan provided today and agrees to view in MyChart. Active MyChart status and patient understanding of how to access instructions and care plan via MyChart confirmed with patient.  Jeanie Cooks, PhD [facility], [facility] Social Worker Direct Dial: [phone]  Fax: [phone]

## 2023-04-22 ENCOUNTER — Ambulatory Visit: Payer: Self-pay | Admitting: Licensed Clinical Social Worker

## 2023-04-22 NOTE — Patient Outreach (Signed)
  Care Coordination   Follow Up Visit Note   04/22/2023 Name: Monica Welch MRN: [id number] DOB: Mar 21, 1961  Monica Welch is a 62 y.o. year old female who sees Burns, Bobette Mo, MD for primary care. I spoke with  Monica Welch by phone today.  What matters to the patients health and wellness today?  Sw made 2 attempts to contact the patient and finally was able to reach the patient. The patient stated that she was approved by the [facility] and was going to be contacting them for further direction and utility assistance for new home.     Goals Addressed             This Visit's Progress    Care Coordination Activities       Care Coordination Interventions: Patient was approved for [employer], the patient stated that she spoke to someone and the paper work for the approval was mailed to the wrong address, the patient is calling back to gather more information on her approval. The patient stated that she may need assistance with deposits and the SW will mail a list of agencies that might possibly assist.         SDOH assessments and interventions completed:  Yes  SDOH Interventions Today    Flowsheet Row Most Recent Value  SDOH Interventions   Food Insecurity Interventions Intervention Not Indicated  Housing Interventions Other (Comment)  [Payiente stated that she contacted the [employer] authority and that her application was approved]  Transportation Interventions Intervention Not Indicated  Utilities Interventions Other (Comment)  [Patient stated that she may need assistance with the deposits, Sw will mail a utility resource list]        Care Coordination Interventions:  Yes, provided  Interventions Today    Flowsheet Row Most Recent Value  General Interventions   General Interventions Discussed/Reviewed General Interventions Discussed, General Interventions Reviewed, Science writer wasa approved for [employer] and Sw willmail out [employer] list]        Follow up plan: Follow up call scheduled for 04/29/2023 at 2:00 pm    Encounter Outcome:  Patient Visit Completed  Jeanie Cooks, PhD [facility], [facility] Social Worker Direct Dial: [phone]  Fax: [phone]

## 2023-04-22 NOTE — Patient Instructions (Signed)
Visit Information  Thank you for taking time to visit with me today. Please don't hesitate to contact me if I can be of assistance to you.   Following are the goals we discussed today:   Goals Addressed             This Visit's Progress    Care Coordination Activities       Care Coordination Interventions: Patient was approved for [employer], the patient stated that she spoke to someone and the paper work for the approval was mailed to the wrong address, the patient is calling back to gather more information on her approval. The patient stated that she may need assistance with deposits and the SW will mail a list of agencies that might possibly assist.         Our next appointment is by telephone on 04/29/2023 at 2:00 pm  Please call the care guide team at [phone] if you need to cancel or reschedule your appointment.   If you are experiencing a Mental Health or Behavioral Health Crisis or need someone to talk to, please call the Suicide and Crisis Lifeline: 988 go to [facility] Urgent [facility] [address] [place] [phone]) call 911  Patient verbalizes understanding of instructions and care plan provided today and agrees to view in MyChart. Active MyChart status and patient understanding of how to access instructions and care plan via MyChart confirmed with patient.     Jeanie Cooks, PhD [facility], [facility] Social Worker Direct Dial: [phone]  Fax: [phone]

## 2023-04-23 DIAGNOSIS — E1140 Type 2 diabetes mellitus with diabetic neuropathy, unspecified: Secondary | ICD-10-CM | POA: Diagnosis not present

## 2023-04-29 ENCOUNTER — Ambulatory Visit: Payer: Self-pay | Admitting: Licensed Clinical Social Worker

## 2023-04-29 NOTE — Patient Outreach (Signed)
  Care Coordination   04/29/2023 Name: Monica Welch MRN: [id number] DOB: [birth year]   Care Coordination Outreach Attempts:  An unsuccessful telephone outreach was attempted for a scheduled appointment today.  Follow Up Plan:  Additional outreach attempts will be made to offer the patient care coordination information and services.   Encounter Outcome:  No Answer   Care Coordination Interventions:  No, not indicated    Jeanie Cooks, PhD [facility], [facility] Social Worker Direct Dial: [phone]  Fax: [phone]

## 2023-05-09 ENCOUNTER — Ambulatory Visit: Payer: Self-pay | Admitting: Licensed Clinical Social Worker

## 2023-05-09 NOTE — Patient Outreach (Signed)
  Care Coordination   05/09/2023 Name: Monica Welch MRN: [id number] DOB: 21-Jan-1961   Care Coordination Outreach Attempts:  A third unsuccessful outreach was attempted today to offer the patient with information about available care coordination services.  Follow Up Plan:  No further outreach attempts will be made at this time. We have been unable to contact the patient to offer or enroll patient in care coordination services  Encounter Outcome:  No Answer   Care Coordination Interventions:  No, not indicated    Jeanie Cooks, PhD [facility], [facility] Social Worker Direct Dial: [phone]  Fax: [phone]

## 2023-06-09 ENCOUNTER — Ambulatory Visit: Payer: Medicare PPO | Admitting: Internal Medicine

## 2023-07-06 DIAGNOSIS — Z9081 Acquired absence of spleen: Secondary | ICD-10-CM | POA: Insufficient documentation

## 2023-07-06 NOTE — Patient Instructions (Addendum)
      Blood work was ordered.       Medications changes include :   None    A referral was ordered and someone will call you to schedule an appointment.     Return in about 2 months (around 09/04/2023) for follow up.

## 2023-07-06 NOTE — Progress Notes (Signed)
      Subjective:    Patient ID: Monica Welch, female    DOB: June 18, 1961, 63 y.o.   MRN: [id number]     HPI Aaleah is here for follow up of her chronic medical problems.    Medications and allergies reviewed with patient and updated if appropriate.  Current Outpatient Medications on File Prior to Visit  Medication Sig Dispense Refill  . blood glucose meter kit and supplies KIT Dispense based on patient and insurance preference. Use up to 3 times daily as directed. (FOR E11.9). 1 each 0  . Blood Glucose Monitoring Suppl DEVI UAD to check sugars twice daily,  E11.9  May substitute to any manufacturer covered by patient's insurance. 1 each 0  . glipiZIDE  (GLUCOTROL ) 10 MG tablet Take 1 tablet (10 mg total) by mouth 2 (two) times daily before a meal. 180 tablet 1  . Glucose Blood (BLOOD GLUCOSE TEST STRIPS) STRP UAD to check sugars twice daily,  E11.9  May substitute to any manufacturer covered by patient's insurance. 100 strip 3  . Lancet Device MISC UAD to check sugars twice daily,  E11.9  May substitute to any manufacturer covered by patient's insurance. 1 each 0  . Lancets Micro Thin 33G MISC CHECK BLOOD SUGAR UP TO 3 TIMES PER DAY. E11.9 100 each 5  . Lancets Misc. MISC UAD to check sugars twice daily,  E11.9  May substitute to any manufacturer covered by patient's insurance. 100 each 3   No current facility-administered medications on file prior to visit.     Review of Systems     Objective:  There were no vitals filed for this visit. BP Readings from Last 3 Encounters:  10/28/23 128/84  04/08/23 120/80  06/03/22 120/70   Wt Readings from Last 3 Encounters:  10/28/23 181 lb (82.1 kg)  10/24/23 175 lb (79.4 kg)  04/08/23 175 lb (79.4 kg)   There is no height or weight on file to calculate BMI.    Physical Exam     Lab Results  Component Value Date   WBC 9.4 10/28/2023   HGB 16.3 (H) 10/28/2023   HCT 49.5 (H) 10/28/2023   PLT 194.0 10/28/2023   GLUCOSE  367 (H) 10/28/2023   CHOL 216 (H) 10/28/2023   TRIG 164.0 (H) 10/28/2023   HDL 36.10 (L) 10/28/2023   LDLDIRECT 135.0 03/04/2022   LDLCALC 147 (H) 10/28/2023   ALT 22 10/28/2023   AST 16 10/28/2023   NA 136 10/28/2023   K 4.5 10/28/2023   CL 99 10/28/2023   CREATININE 0.90 10/28/2023   BUN 19 10/28/2023   CO2 27 10/28/2023   TSH 1.25 03/04/2022   INR 1.0 05/06/2022   HGBA1C 12.0 (H) 10/28/2023   MICROALBUR 4.2 (H) 10/28/2023     Assessment & Plan:    See Problem List for Assessment and Plan of chronic medical problems.    This encounter was created in error - please disregard.

## 2023-07-07 ENCOUNTER — Encounter: Payer: Medicare PPO | Admitting: Internal Medicine

## 2023-07-07 DIAGNOSIS — E1142 Type 2 diabetes mellitus with diabetic polyneuropathy: Secondary | ICD-10-CM

## 2023-07-07 DIAGNOSIS — F32A Depression, unspecified: Secondary | ICD-10-CM

## 2023-07-07 DIAGNOSIS — E782 Mixed hyperlipidemia: Secondary | ICD-10-CM

## 2023-07-22 ENCOUNTER — Telehealth: Payer: Self-pay | Admitting: Internal Medicine

## 2023-07-22 NOTE — Telephone Encounter (Signed)
Pt asking if we accept [employer] C-SNP plans?  Please let e know so I can inform the pt.

## 2023-08-25 ENCOUNTER — Ambulatory Visit (INDEPENDENT_AMBULATORY_CARE_PROVIDER_SITE_OTHER): Payer: No Typology Code available for payment source | Admitting: Podiatry

## 2023-08-25 DIAGNOSIS — Z91199 Patient's noncompliance with other medical treatment and regimen due to unspecified reason: Secondary | ICD-10-CM

## 2023-08-27 NOTE — Progress Notes (Signed)
 Did not come in for appointment or cancel

## 2023-09-08 DIAGNOSIS — E1165 Type 2 diabetes mellitus with hyperglycemia: Secondary | ICD-10-CM | POA: Diagnosis not present

## 2023-09-08 DIAGNOSIS — F319 Bipolar disorder, unspecified: Secondary | ICD-10-CM | POA: Diagnosis not present

## 2023-09-08 DIAGNOSIS — E785 Hyperlipidemia, unspecified: Secondary | ICD-10-CM | POA: Diagnosis not present

## 2023-09-08 DIAGNOSIS — E1169 Type 2 diabetes mellitus with other specified complication: Secondary | ICD-10-CM | POA: Diagnosis not present

## 2023-09-08 DIAGNOSIS — Z008 Encounter for other general examination: Secondary | ICD-10-CM | POA: Diagnosis not present

## 2023-09-08 DIAGNOSIS — D8481 Immunodeficiency due to conditions classified elsewhere: Secondary | ICD-10-CM | POA: Diagnosis not present

## 2023-09-08 DIAGNOSIS — Z6834 Body mass index (BMI) 34.0-34.9, adult: Secondary | ICD-10-CM | POA: Diagnosis not present

## 2023-09-08 DIAGNOSIS — E669 Obesity, unspecified: Secondary | ICD-10-CM | POA: Diagnosis not present

## 2023-09-08 DIAGNOSIS — F411 Generalized anxiety disorder: Secondary | ICD-10-CM | POA: Diagnosis not present

## 2023-09-08 DIAGNOSIS — R2681 Unsteadiness on feet: Secondary | ICD-10-CM | POA: Diagnosis not present

## 2023-10-14 ENCOUNTER — Ambulatory Visit: Payer: Medicare (Managed Care) | Admitting: Podiatry

## 2023-10-24 ENCOUNTER — Ambulatory Visit (INDEPENDENT_AMBULATORY_CARE_PROVIDER_SITE_OTHER): Payer: Medicare (Managed Care)

## 2023-10-24 VITALS — Ht 62.0 in | Wt 175.0 lb

## 2023-10-24 DIAGNOSIS — Z1211 Encounter for screening for malignant neoplasm of colon: Secondary | ICD-10-CM | POA: Diagnosis not present

## 2023-10-24 DIAGNOSIS — Z0000 Encounter for general adult medical examination without abnormal findings: Secondary | ICD-10-CM

## 2023-10-24 NOTE — Patient Instructions (Addendum)
 Monica Welch , Thank you for taking time to come for your Medicare Wellness Visit. I appreciate your ongoing commitment to your health goals. Please review the following plan we discussed and let me know if I can assist you in the future.   Referrals/Orders/Follow-Ups/Clinician Recommendations: Aim for 30 minutes of exercise or brisk walking, 6-8 glasses of water, and 5 servings of fruits and vegetables each day. Educated and advised on getting the pap smear w/PCP, a Mammogram, Cologuard, and Pneumonia vaccine in 2025.  This is a list of the screening recommended for you and due dates:  Health Maintenance  Topic Date Due   Pap with HPV screening  Never done   Eye exam for diabetics  05/03/2020   Cologuard (Stool DNA test)  03/13/2021   Mammogram  06/07/2021   Pneumococcal Vaccination (3 of 3 - PPSV23, PCV20 or PCV21) 08/21/2021   Yearly kidney health urinalysis for diabetes  03/05/2023   Hemoglobin A1C  10/07/2023   Zoster (Shingles) Vaccine (1 of 2) 01/24/2024*   Flu Shot  01/23/2024   Yearly kidney function blood test for diabetes  04/07/2024   Complete foot exam   04/07/2024   Medicare Annual Wellness Visit  10/23/2024   DTaP/Tdap/Td vaccine (2 - Td or Tdap) 02/28/2028   Hepatitis C Screening  Completed   HIV Screening  Completed   HPV Vaccine  Aged Out   Meningitis B Vaccine  Aged Out   COVID-19 Vaccine  Discontinued  *Topic was postponed. The date shown is not the original due date.    Advanced directives: (Declined) Advance directive discussed with you today. Even though you declined this today, please call our office should you change your mind, and we can give you the proper paperwork for you to fill out.  Next Medicare Annual Wellness Visit scheduled for next year: Yes  Have you seen your provider in the last 6 months (3 months if uncontrolled diabetes)? Yes-Scheduled appt today for next week w/Dr Donnette Gal
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
## 2023-10-27 ENCOUNTER — Encounter: Payer: Self-pay | Admitting: Internal Medicine

## 2023-10-27 NOTE — Progress Notes (Unsigned)
 Subjective:    Patient ID: Monica Welch, female    DOB: [birth year], 63 y.o.   MRN: [id number]     HPI Monica Welch is here for follow up of her chronic medical problems.  Chief Complaint  Patient presents with   Medical Management of Chronic Issues    Metformin  is now causing diarrhea and cramping    Has stopped the metformin  one month ago.   Sugars 228 - 270 at home.  She ran out of some of her medications.  Medications and allergies reviewed with patient and updated if appropriate.  Current Outpatient Medications on File Prior to Visit  Medication Sig Dispense Refill   blood glucose meter kit and supplies KIT Dispense based on patient and insurance preference. Use up to 3 times daily as directed. (FOR E11.9). 1 each 0   Blood Glucose Monitoring Suppl DEVI UAD to check sugars twice daily,  E11.9  May substitute to any manufacturer covered by patient's insurance. 1 each 0   glipiZIDE  (GLUCOTROL ) 10 MG tablet Take 1 tablet (10 mg total) by mouth 2 (two) times daily before a meal. 180 tablet 1   Glucose Blood (BLOOD GLUCOSE TEST STRIPS) STRP UAD to check sugars twice daily,  E11.9  May substitute to any manufacturer covered by patient's insurance. 100 strip 3   Lancet Device MISC UAD to check sugars twice daily,  E11.9  May substitute to any manufacturer covered by patient's insurance. 1 each 0   Lancets Micro Thin 33G MISC CHECK BLOOD SUGAR UP TO 3 TIMES PER DAY. E11.9 100 each 5   Lancets Misc. MISC UAD to check sugars twice daily,  E11.9  May substitute to any manufacturer covered by patient's insurance. 100 each 3   No current facility-administered medications on file prior to visit.     Review of Systems  Constitutional:  Negative for fever.  HENT:  Negative for trouble swallowing (hard foods, rice, sometimes pills - 2/month).   Respiratory:  Positive for cough (related to allergies). Negative for shortness of breath and wheezing.   Cardiovascular:  Positive for  chest pain (with walking) and leg swelling. Negative for palpitations.  Gastrointestinal:  Negative for abdominal pain.  Neurological:  Negative for light-headedness and headaches.       Objective:   Vitals:   10/28/23 1311  BP: 128/84  Pulse: 80  Temp: 98 F (36.7 C)  SpO2: 98%   BP Readings from Last 3 Encounters:  10/28/23 128/84  04/08/23 120/80  06/03/22 120/70   Wt Readings from Last 3 Encounters:  10/28/23 181 lb (82.1 kg)  10/24/23 175 lb (79.4 kg)  04/08/23 175 lb (79.4 kg)   Body mass index is 33.11 kg/m.    Physical Exam Constitutional:      General: She is not in acute distress.    Appearance: Normal appearance.  HENT:     Head: Normocephalic and atraumatic.  Eyes:     Conjunctiva/sclera: Conjunctivae normal.  Cardiovascular:     Rate and Rhythm: Normal rate and regular rhythm.     Heart sounds: Normal heart sounds.  Pulmonary:     Effort: Pulmonary effort is normal. No respiratory distress.     Breath sounds: Normal breath sounds. No wheezing.  Musculoskeletal:     Cervical back: Neck supple.     Right lower leg: No edema.     Left lower leg: No edema.  Lymphadenopathy:     Cervical: No cervical adenopathy.  Skin:  General: Skin is warm and dry.     Findings: No rash.  Neurological:     Mental Status: She is alert. Mental status is at baseline.  Psychiatric:        Mood and Affect: Mood normal.        Behavior: Behavior normal.        Lab Results  Component Value Date   WBC 10.8 (H) 04/08/2023   HGB 16.2 (H) 04/08/2023   HCT 50.1 (H) 04/08/2023   PLT 250.0 04/08/2023   GLUCOSE 203 (H) 04/08/2023   CHOL 220 (H) 04/08/2023   TRIG 216.0 (H) 04/08/2023   HDL 41.00 04/08/2023   LDLDIRECT 135.0 03/04/2022   LDLCALC 136 (H) 04/08/2023   ALT 17 04/08/2023   AST 16 04/08/2023   NA 138 04/08/2023   K 4.1 04/08/2023   CL 102 04/08/2023   CREATININE 0.89 04/08/2023   BUN 15 04/08/2023   CO2 29 04/08/2023   TSH 1.25 03/04/2022   INR  1.0 05/06/2022   HGBA1C 12.2 (H) 04/08/2023   MICROALBUR <0.7 03/04/2022     Assessment & Plan:   Eye appt scheduled.   Cologuard ordered previously   Will schedule mammo    See Problem List for Assessment and Plan of chronic medical problems.

## 2023-10-27 NOTE — Patient Instructions (Addendum)
      Blood work was ordered.       Medications changes include :   start mounjaro once a week    A referral was ordered a nutrition referral and someone will call you to schedule an appointment.     Return in about 3 months (around 01/28/2024) for follow up.

## 2023-10-28 ENCOUNTER — Encounter: Payer: Self-pay | Admitting: Internal Medicine

## 2023-10-28 ENCOUNTER — Ambulatory Visit (INDEPENDENT_AMBULATORY_CARE_PROVIDER_SITE_OTHER): Payer: Medicare (Managed Care) | Admitting: Internal Medicine

## 2023-10-28 VITALS — BP 128/84 | HR 80 | Temp 98.0°F | Ht 62.0 in | Wt 181.0 lb

## 2023-10-28 DIAGNOSIS — E1142 Type 2 diabetes mellitus with diabetic polyneuropathy: Secondary | ICD-10-CM

## 2023-10-28 DIAGNOSIS — E782 Mixed hyperlipidemia: Secondary | ICD-10-CM | POA: Diagnosis not present

## 2023-10-28 DIAGNOSIS — F419 Anxiety disorder, unspecified: Secondary | ICD-10-CM | POA: Diagnosis not present

## 2023-10-28 DIAGNOSIS — E538 Deficiency of other specified B group vitamins: Secondary | ICD-10-CM | POA: Diagnosis not present

## 2023-10-28 DIAGNOSIS — K219 Gastro-esophageal reflux disease without esophagitis: Secondary | ICD-10-CM | POA: Diagnosis not present

## 2023-10-28 DIAGNOSIS — F32A Depression, unspecified: Secondary | ICD-10-CM | POA: Diagnosis not present

## 2023-10-28 DIAGNOSIS — Z7984 Long term (current) use of oral hypoglycemic drugs: Secondary | ICD-10-CM | POA: Diagnosis not present

## 2023-10-28 LAB — CBC WITH DIFFERENTIAL/PLATELET
Basophils Absolute: 0.1 10*3/uL (ref 0.0–0.1)
Basophils Relative: 1.2 % (ref 0.0–3.0)
Eosinophils Absolute: 0.2 10*3/uL (ref 0.0–0.7)
Eosinophils Relative: 2.2 % (ref 0.0–5.0)
HCT: 49.5 % — ABNORMAL HIGH (ref 36.0–46.0)
Hemoglobin: 16.3 g/dL — ABNORMAL HIGH (ref 12.0–15.0)
Lymphocytes Relative: 21.9 % (ref 12.0–46.0)
Lymphs Abs: 2.1 10*3/uL (ref 0.7–4.0)
MCHC: 33 g/dL (ref 30.0–36.0)
MCV: 92.9 fl (ref 78.0–100.0)
Monocytes Absolute: 0.6 10*3/uL (ref 0.1–1.0)
Monocytes Relative: 6.3 % (ref 3.0–12.0)
Neutro Abs: 6.5 10*3/uL (ref 1.4–7.7)
Neutrophils Relative %: 68.4 % (ref 43.0–77.0)
Platelets: 194 10*3/uL (ref 150.0–400.0)
RBC: 5.33 Mil/uL — ABNORMAL HIGH (ref 3.87–5.11)
RDW: 13.3 % (ref 11.5–15.5)
WBC: 9.4 10*3/uL (ref 4.0–10.5)

## 2023-10-28 LAB — LIPID PANEL
Cholesterol: 216 mg/dL — ABNORMAL HIGH (ref 0–200)
HDL: 36.1 mg/dL — ABNORMAL LOW (ref >39.00)
LDL Cholesterol: 147 mg/dL — ABNORMAL HIGH (ref 0–99)
NonHDL: 179.65
Total CHOL/HDL Ratio: 6
Triglycerides: 164 mg/dL — ABNORMAL HIGH (ref 0.0–149.0)
VLDL: 32.8 mg/dL (ref 0.0–40.0)

## 2023-10-28 LAB — COMPREHENSIVE METABOLIC PANEL WITH GFR
ALT: 22 U/L (ref 0–35)
AST: 16 U/L (ref 0–37)
Albumin: 4.1 g/dL (ref 3.5–5.2)
Alkaline Phosphatase: 48 U/L (ref 39–117)
BUN: 19 mg/dL (ref 6–23)
CO2: 27 meq/L (ref 19–32)
Calcium: 10 mg/dL (ref 8.4–10.5)
Chloride: 99 meq/L (ref 96–112)
Creatinine, Ser: 0.9 mg/dL (ref 0.40–1.20)
GFR: 68.33 mL/min (ref >60.00)
Glucose, Bld: 367 mg/dL — ABNORMAL HIGH (ref 70–99)
Potassium: 4.5 meq/L (ref 3.5–5.1)
Sodium: 136 meq/L (ref 135–145)
Total Bilirubin: 0.9 mg/dL (ref 0.2–1.2)
Total Protein: 7.5 g/dL (ref 6.0–8.3)

## 2023-10-28 LAB — MICROALBUMIN / CREATININE URINE RATIO
Creatinine,U: 130.5 mg/dL
Microalb Creat Ratio: 32.1 mg/g — ABNORMAL HIGH (ref 0.0–30.0)
Microalb, Ur: 4.2 mg/dL — ABNORMAL HIGH (ref 0.0–1.9)

## 2023-10-28 LAB — VITAMIN B12: Vitamin B-12: 388 pg/mL (ref 211–911)

## 2023-10-28 LAB — HEMOGLOBIN A1C: Hgb A1c MFr Bld: 12 % — ABNORMAL HIGH (ref 4.6–6.5)

## 2023-10-28 MED ORDER — ATORVASTATIN CALCIUM 20 MG PO TABS: 20.0000 mg | ORAL_TABLET | Freq: Every day | ORAL | 1 refills | Status: DC

## 2023-10-28 MED ORDER — PREGABALIN 75 MG PO CAPS: 75.0000 mg | ORAL_CAPSULE | Freq: Two times a day (BID) | ORAL | 0 refills | Status: DC

## 2023-10-28 MED ORDER — VITAMIN B-12 1000 MCG PO TABS: 1000.0000 ug | ORAL_TABLET | Freq: Every day | ORAL | 3 refills | Status: AC

## 2023-10-28 MED ORDER — OMEPRAZOLE 40 MG PO CPDR: DELAYED_RELEASE_CAPSULE | ORAL | 1 refills | Status: DC

## 2023-10-28 MED ORDER — TIRZEPATIDE 2.5 MG/0.5ML ~~LOC~~ SOAJ: 2.5000 mg | SUBCUTANEOUS | 0 refills | Status: DC

## 2023-10-28 MED ORDER — DULOXETINE HCL 60 MG PO CPEP: 60.0000 mg | ORAL_CAPSULE | Freq: Every day | ORAL | 1 refills | Status: DC

## 2023-10-28 NOTE — Assessment & Plan Note (Signed)
 Chronic Not ideally controlled because she ran out of the duloxetine  She would like to restart Restart duloxetine  60 mg daily

## 2023-10-28 NOTE — Assessment & Plan Note (Signed)
 Chronic She ran out of her medication- will restart atorvastatin  20 mg daily Check lipids today - will recheck in 3 months at her next appointment

## 2023-10-28 NOTE — Assessment & Plan Note (Addendum)
 Chronic GERD occ with medication Some dysphagia Deferred GI evaluation Continue omeprazole  40 mg daily

## 2023-10-28 NOTE — Assessment & Plan Note (Addendum)
 Chronic Very uncontrolled Lab Results  Component Value Date   HGBA1C 12.2 (H) 04/08/2023   Stopped metformin  due to diarrhea one month ago continue glipizide  10 mg 2 times a day Sugars at home 228-270 Referral to nutrition Start mounjaro 2.5 mg weekly - will titrate monthly as tolerated.  Discussed possible side effects Stressed better control of her sugars

## 2023-10-28 NOTE — Assessment & Plan Note (Signed)
 Chronic Ran out of her Lyrica  Restart lyrica  75 mg bid Stressed sugar control

## 2023-10-28 NOTE — Assessment & Plan Note (Addendum)
 Chronic Not currently taking any supplementation B12 prescription sent to pharmacy-hopefully this will be covered Check B12 level

## 2023-10-29 ENCOUNTER — Telehealth: Payer: Self-pay

## 2023-10-29 ENCOUNTER — Other Ambulatory Visit (HOSPITAL_COMMUNITY): Payer: Self-pay

## 2023-10-29 NOTE — Telephone Encounter (Signed)
 Pharmacy Patient Advocate Encounter   Received notification from Patient Pharmacy that prior authorization for Mounjaro is required/requested.   Insurance verification completed.   The patient is insured through  [employer]  .   Per test claim: PA required; PA submitted to above mentioned insurance via CoverMyMeds Key/confirmation #/EOC [id number] Status is pending

## 2023-10-29 NOTE — Telephone Encounter (Signed)
 Pharmacy Patient Advocate Encounter  Received notification from  [employer]  that Prior Authorization for Mounjaro has been APPROVED from 10/29/23 to 06/23/24. Ran test claim, Copay is $12.15 for a 28 day supply. This test claim was processed through [facility]- copay amounts may vary at other pharmacies due to pharmacy/plan contracts, or as the patient moves through the different stages of their insurance plan.   PA #/Case ID/Reference #: [id number]

## 2023-11-10 ENCOUNTER — Other Ambulatory Visit (HOSPITAL_COMMUNITY): Payer: Self-pay

## 2023-11-10 ENCOUNTER — Telehealth: Payer: Self-pay

## 2023-11-10 NOTE — Telephone Encounter (Signed)
 Pharmacy Patient Advocate Encounter  Received notification from [facility] that Prior Authorization for Mounjaro 2.5mg /0.82ml has been CANCELLED due to prior authorization not required. There is an approval on file from 10/15/23 until further notice for the Mounjaro. Per test claim, it is a refill too soon, last filled 10/31/23 next fill 11/20/23.

## 2023-11-10 NOTE — Telephone Encounter (Signed)
 Message sent to patient

## 2023-11-10 NOTE — Telephone Encounter (Signed)
 Pharmacy Patient Advocate Encounter   Received notification from Pt Calls Messages that prior authorization for Mounjaro 2.5mg /0.25ml is required/requested.   Insurance verification completed.   The patient is insured through [facility] .   Per test claim: PA required; PA submitted to above mentioned insurance via CoverMyMeds Key/confirmation #/EOC [facility] Status is pending

## 2023-11-10 NOTE — Telephone Encounter (Signed)
 Copied from CRM [id number]. Topic: Clinical - Medication Question >> Nov 07, 2023  3:55 PM Earnestine Goes B wrote:  Reason for CRM: pt called to advise the provider, her insurance requires a prior authorization for manjaro. Please call pt back pt [phone] .

## 2023-11-11 LAB — COLOGUARD

## 2023-11-13 ENCOUNTER — Other Ambulatory Visit: Payer: Self-pay

## 2023-11-13 ENCOUNTER — Ambulatory Visit: Payer: Self-pay

## 2023-11-13 DIAGNOSIS — Z1211 Encounter for screening for malignant neoplasm of colon: Secondary | ICD-10-CM

## 2023-11-24 ENCOUNTER — Ambulatory Visit
Admission: RE | Admit: 2023-11-24 | Discharge: 2023-11-24 | Disposition: A | Discharge status: Home / Self Care | Payer: Medicare (Managed Care) | Source: Ambulatory Visit | Attending: Internal Medicine | Admitting: Internal Medicine

## 2023-11-24 ENCOUNTER — Telehealth: Payer: Self-pay | Admitting: Internal Medicine

## 2023-11-24 DIAGNOSIS — Z1231 Encounter for screening mammogram for malignant neoplasm of breast: Secondary | ICD-10-CM

## 2023-11-24 NOTE — Telephone Encounter (Signed)
 Copied from CRM [id number]. Topic: General - Other >> Nov 24, 2023  2:00 PM Caliyah H wrote: Reason for CRM: Patient called to inform Dr. Donnette Gal that she finished the Mounjaro shot on Friday.

## 2023-11-25 ENCOUNTER — Ambulatory Visit: Payer: Medicare (Managed Care) | Admitting: Dietician

## 2023-11-25 ENCOUNTER — Other Ambulatory Visit: Payer: Self-pay

## 2023-11-25 DIAGNOSIS — E1142 Type 2 diabetes mellitus with diabetic polyneuropathy: Secondary | ICD-10-CM

## 2023-11-25 DIAGNOSIS — Z6841 Body Mass Index (BMI) 40.0 and over, adult: Secondary | ICD-10-CM

## 2023-11-25 MED ORDER — TIRZEPATIDE 5 MG/0.5ML ~~LOC~~ SOAJ: 5.0000 mg | SUBCUTANEOUS | 0 refills | Status: DC

## 2023-11-25 NOTE — Telephone Encounter (Signed)
 New dose of  5 mg sent to pharmacy today for pick up.

## 2023-12-22 ENCOUNTER — Encounter: Payer: Self-pay | Admitting: Internal Medicine

## 2023-12-22 ENCOUNTER — Other Ambulatory Visit: Payer: Self-pay | Admitting: Internal Medicine

## 2023-12-22 DIAGNOSIS — E1142 Type 2 diabetes mellitus with diabetic polyneuropathy: Secondary | ICD-10-CM

## 2023-12-22 DIAGNOSIS — Z6841 Body Mass Index (BMI) 40.0 and over, adult: Secondary | ICD-10-CM

## 2023-12-22 NOTE — Telephone Encounter (Unsigned)
 Copied from CRM [id number]. Topic: Clinical - Medication Refill >> Dec 22, 2023  9:33 AM Corin V wrote: Medication: tirzepatide  (MOUNJARO ) 5 MG/0.5ML Pen Wants to know if PCP wants to do another dose increase or leave it at this dose.  Has the patient contacted their pharmacy? Yes (Agent: If no, request that the patient contact the pharmacy for the refill. If patient does not wish to contact the pharmacy document the reason why and proceed with request.) (Agent: If yes, when and what did the pharmacy advise?)  This is the patient's preferred pharmacy:  [id number] - [place], [place] - [address] AT [facility] [address] [place] [address] Phone: [phone] Fax: [phone]  Is this the correct pharmacy for this prescription? Yes If no, delete pharmacy and type the correct one.   Has the prescription been filled recently? No  Is the patient out of the medication? No  Has the patient been seen for an appointment in the last year OR does the patient have an upcoming appointment? Yes  Can we respond through MyChart? Yes  Agent: Please be advised that Rx refills may take up to 3 business days. We ask that you follow-up with your pharmacy.

## 2023-12-23 MED ORDER — TIRZEPATIDE 7.5 MG/0.5ML ~~LOC~~ SOAJ: 7.5000 mg | SUBCUTANEOUS | 0 refills | Status: DC

## 2024-01-23 ENCOUNTER — Other Ambulatory Visit: Payer: Self-pay | Admitting: Internal Medicine

## 2024-01-23 NOTE — Telephone Encounter (Signed)
 Copied from CRM [id number]. Topic: Clinical - Medication Refill >> Jan 23, 2024  4:07 PM Jayma L wrote: Medication: tirzepatide  (MOUNJARO ) 7.5 MG/0.5ML Pen  Has the patient contacted their pharmacy? No (Agent: If no, request that the patient contact the pharmacy for the refill. If patient does not wish to contact the pharmacy document the reason why and proceed with request.) (Agent: If yes, when and what did the pharmacy advise?)  This is the patient's preferred pharmacy:  [id number] - [place], [place] - [address] AT [facility] OF [address] & [address] [place] [address] Phone: [phone] Fax: [phone]  Is this the correct pharmacy for this prescription? Yes If no, delete pharmacy and type the correct one.   Has the prescription been filled recently? Yes , 3 weeks ago   Is the patient out of the medication? No  Has the patient been seen for an appointment in the last year OR does the patient have an upcoming appointment? Yes  Can we respond through MyChart? Yes  Agent: Please be advised that Rx refills may take up to 3 business days. We ask that you follow-up with your pharmacy.

## 2024-01-25 ENCOUNTER — Encounter: Payer: Self-pay | Admitting: Internal Medicine

## 2024-01-25 NOTE — Progress Notes (Unsigned)
 Subjective:    Patient ID: Monica Welch, female    DOB: 07/06/1960, 63 y.o.   MRN: [id number]     HPI Monica Welch is here for follow up of her chronic medical problems.  She is taking all of her medications as prescribed.    Not taking B12.   Medications and allergies reviewed with patient and updated if appropriate.  Current Outpatient Medications on File Prior to Visit  Medication Sig Dispense Refill   atorvastatin  (LIPITOR) 20 MG tablet Take 1 tablet (20 mg total) by mouth daily. 90 tablet 1   Blood Glucose Monitoring Suppl DEVI UAD to check sugars twice daily,  E11.9  May substitute to any manufacturer covered by patient's insurance. 1 each 0   cyanocobalamin  (VITAMIN B12) 1000 MCG tablet Take 1 tablet (1,000 mcg total) by mouth daily. 90 tablet 3   DULoxetine  (CYMBALTA ) 60 MG capsule Take 1 capsule (60 mg total) by mouth daily. 90 capsule 1   glipiZIDE  (GLUCOTROL ) 10 MG tablet Take 1 tablet (10 mg total) by mouth 2 (two) times daily before a meal. 180 tablet 1   Glucose Blood (BLOOD GLUCOSE TEST STRIPS) STRP UAD to check sugars twice daily,  E11.9  May substitute to any manufacturer covered by patient's insurance. 100 strip 3   Lancet Device MISC UAD to check sugars twice daily,  E11.9  May substitute to any manufacturer covered by patient's insurance. 1 each 0   Lancets Micro Thin 33G MISC CHECK BLOOD SUGAR UP TO 3 TIMES PER DAY. E11.9 100 each 5   Lancets Misc. MISC UAD to check sugars twice daily,  E11.9  May substitute to any manufacturer covered by patient's insurance. 100 each 3   omeprazole  (PRILOSEC) 40 MG capsule TAKE 1 CAPSULE(40 MG) BY MOUTH DAILY - TAKE 30 MINUTES PRIOR TO A MEAL 90 capsule 1   pregabalin  (LYRICA ) 75 MG capsule Take 1 capsule (75 mg total) by mouth 2 (two) times daily. 180 capsule 0   tirzepatide  (MOUNJARO ) 7.5 MG/0.5ML Pen Inject 7.5 mg into the skin once a week. 2 mL 0   No current facility-administered medications on file prior to visit.      Review of Systems  Constitutional:  Negative for fever.  Respiratory:  Positive for cough (chronic). Negative for shortness of breath and wheezing.   Cardiovascular:  Positive for leg swelling. Negative for chest pain and palpitations.  Gastrointestinal:  Positive for constipation (occ). Negative for nausea.       No gerd  Neurological:  Positive for dizziness (occ) and light-headedness (occ). Negative for headaches.       Objective:   Vitals:   01/26/24 1347  BP: 134/82  Pulse: 82  Temp: 97.8 F (36.6 C)  SpO2: 98%   BP Readings from Last 3 Encounters:  01/26/24 134/82  10/28/23 128/84  04/08/23 120/80   Wt Readings from Last 3 Encounters:  01/26/24 184 lb (83.5 kg)  10/28/23 181 lb (82.1 kg)  10/24/23 175 lb (79.4 kg)   Body mass index is 33.65 kg/m.    Physical Exam Constitutional:      General: She is not in acute distress.    Appearance: Normal appearance.  HENT:     Head: Normocephalic and atraumatic.  Eyes:     Conjunctiva/sclera: Conjunctivae normal.  Cardiovascular:     Rate and Rhythm: Normal rate and regular rhythm.     Heart sounds: Normal heart sounds.  Pulmonary:     Effort: Pulmonary  effort is normal. No respiratory distress.     Breath sounds: Normal breath sounds. No wheezing.  Musculoskeletal:     Cervical back: Neck supple.     Right lower leg: No edema.     Left lower leg: No edema.  Lymphadenopathy:     Cervical: No cervical adenopathy.  Skin:    General: Skin is warm and dry.     Findings: No rash.  Neurological:     Mental Status: She is alert. Mental status is at baseline.  Psychiatric:        Mood and Affect: Mood normal.        Behavior: Behavior normal.        Lab Results  Component Value Date   WBC 9.4 10/28/2023   HGB 16.3 (H) 10/28/2023   HCT 49.5 (H) 10/28/2023   PLT 194.0 10/28/2023   GLUCOSE 367 (H) 10/28/2023   CHOL 216 (H) 10/28/2023   TRIG 164.0 (H) 10/28/2023   HDL 36.10 (L) 10/28/2023   LDLDIRECT  135.0 03/04/2022   LDLCALC 147 (H) 10/28/2023   ALT 22 10/28/2023   AST 16 10/28/2023   NA 136 10/28/2023   K 4.5 10/28/2023   CL 99 10/28/2023   CREATININE 0.90 10/28/2023   BUN 19 10/28/2023   CO2 27 10/28/2023   TSH 1.25 03/04/2022   INR 1.0 05/06/2022   HGBA1C 12.0 (H) 10/28/2023   MICROALBUR 4.2 (H) 10/28/2023     Assessment & Plan:    See Problem List for Assessment and Plan of chronic medical problems.

## 2024-01-25 NOTE — Patient Instructions (Addendum)
      Blood work was ordered.       Medications changes include :   mounjaro  10 mg weekly      Return in about 3 months (around 04/27/2024) for follow up.

## 2024-01-26 ENCOUNTER — Ambulatory Visit: Payer: Self-pay | Admitting: Internal Medicine

## 2024-01-26 ENCOUNTER — Ambulatory Visit (INDEPENDENT_AMBULATORY_CARE_PROVIDER_SITE_OTHER): Payer: Medicare (Managed Care) | Admitting: Internal Medicine

## 2024-01-26 VITALS — BP 134/82 | HR 82 | Temp 97.8°F | Ht 62.0 in | Wt 184.0 lb

## 2024-01-26 DIAGNOSIS — F32A Depression, unspecified: Secondary | ICD-10-CM | POA: Diagnosis not present

## 2024-01-26 DIAGNOSIS — Z23 Encounter for immunization: Secondary | ICD-10-CM

## 2024-01-26 DIAGNOSIS — K219 Gastro-esophageal reflux disease without esophagitis: Secondary | ICD-10-CM | POA: Diagnosis not present

## 2024-01-26 DIAGNOSIS — E782 Mixed hyperlipidemia: Secondary | ICD-10-CM

## 2024-01-26 DIAGNOSIS — F419 Anxiety disorder, unspecified: Secondary | ICD-10-CM

## 2024-01-26 DIAGNOSIS — E538 Deficiency of other specified B group vitamins: Secondary | ICD-10-CM

## 2024-01-26 DIAGNOSIS — E1142 Type 2 diabetes mellitus with diabetic polyneuropathy: Secondary | ICD-10-CM

## 2024-01-26 LAB — COMPREHENSIVE METABOLIC PANEL WITH GFR
ALT: 13 U/L (ref 0–35)
AST: 11 U/L (ref 0–37)
Albumin: 3.9 g/dL (ref 3.5–5.2)
Alkaline Phosphatase: 37 U/L — ABNORMAL LOW (ref 39–117)
BUN: 17 mg/dL (ref 6–23)
CO2: 28 meq/L (ref 19–32)
Calcium: 9.1 mg/dL (ref 8.4–10.5)
Chloride: 100 meq/L (ref 96–112)
Creatinine, Ser: 0.94 mg/dL (ref 0.40–1.20)
GFR: 64.75 mL/min (ref >60.00)
Glucose, Bld: 168 mg/dL — ABNORMAL HIGH (ref 70–99)
Potassium: 4.2 meq/L (ref 3.5–5.1)
Sodium: 137 meq/L (ref 135–145)
Total Bilirubin: 0.7 mg/dL (ref 0.2–1.2)
Total Protein: 7.1 g/dL (ref 6.0–8.3)

## 2024-01-26 LAB — LIPID PANEL
Cholesterol: 149 mg/dL (ref 0–200)
HDL: 37.5 mg/dL — ABNORMAL LOW (ref >39.00)
LDL Cholesterol: 81 mg/dL (ref 0–99)
NonHDL: 111.34
Total CHOL/HDL Ratio: 4
Triglycerides: 154 mg/dL — ABNORMAL HIGH (ref 0.0–149.0)
VLDL: 30.8 mg/dL (ref 0.0–40.0)

## 2024-01-26 LAB — HEMOGLOBIN A1C: Hgb A1c MFr Bld: 8.9 % — ABNORMAL HIGH (ref 4.6–6.5)

## 2024-01-26 MED ORDER — TIRZEPATIDE 10 MG/0.5ML ~~LOC~~ SOAJ: 10.0000 mg | SUBCUTANEOUS | 0 refills | Status: DC

## 2024-01-26 MED ORDER — TIRZEPATIDE 7.5 MG/0.5ML ~~LOC~~ SOAJ: 7.5000 mg | SUBCUTANEOUS | 0 refills | Status: DC

## 2024-01-26 NOTE — Assessment & Plan Note (Signed)
 Chronic Continue Lyrica  75 mg bid Stressed sugar control

## 2024-01-26 NOTE — Assessment & Plan Note (Addendum)
 Chronic GERD occ with medication Continue omeprazole  40 mg daily

## 2024-01-26 NOTE — Assessment & Plan Note (Signed)
Chronic Controlled Continue duloxetine 60 mg daily

## 2024-01-26 NOTE — Assessment & Plan Note (Addendum)
 Chronic Uncontrolled Lab Results  Component Value Date   HGBA1C 12.0 (H) 10/28/2023   Sugars at home around 150 Started on Mounjaro  3 months ago-tolerating medication well-currently on 7.5 mg weekly Continue glipizide  10 mg 2 times a day Increase mounjaro  10 mg  If sugars get too low decrease glipizide  to 10 mg once a day Referred to nutrition at her last visit Stressed better control of her sugars

## 2024-01-26 NOTE — Assessment & Plan Note (Addendum)
 Chronic Not taking B12 supplementation daily

## 2024-01-26 NOTE — Assessment & Plan Note (Signed)
 Chronic Continue atorvastatin  20 mg daily Check lipids today

## 2024-02-02 ENCOUNTER — Telehealth: Payer: Self-pay | Admitting: Podiatry

## 2024-02-02 NOTE — Telephone Encounter (Signed)
 Called and left message for patient notifying her that the appointment scheduled via MyChart was canceled as she scheduled in new patient slot and was last seen in 2023. Offered patient to schedule on extended care schedule as she is having extreme swelling and pain in feet.

## 2024-02-04 ENCOUNTER — Other Ambulatory Visit: Payer: Self-pay

## 2024-02-04 DIAGNOSIS — E1142 Type 2 diabetes mellitus with diabetic polyneuropathy: Secondary | ICD-10-CM

## 2024-02-04 MED ORDER — BLOOD GLUCOSE TEST VI STRP: ORAL_STRIP | 5 refills | Status: AC

## 2024-02-04 MED ORDER — LANCET DEVICE MISC: 5 refills | Status: DC

## 2024-02-04 MED ORDER — BLOOD GLUCOSE MONITORING SUPPL DEVI: 1.0000 | Freq: Three times a day (TID) | 0 refills | Status: DC

## 2024-02-05 ENCOUNTER — Telehealth: Payer: Self-pay | Admitting: Podiatry

## 2024-02-05 ENCOUNTER — Ambulatory Visit: Payer: Medicare (Managed Care) | Admitting: Podiatry

## 2024-02-05 NOTE — Telephone Encounter (Signed)
 Patient states that her feet are in pain, swollen and it feels like her skin is going to break. Is there anything she can go for relief?

## 2024-02-06 ENCOUNTER — Ambulatory Visit: Payer: Medicare (Managed Care) | Admitting: Podiatry

## 2024-02-06 ENCOUNTER — Other Ambulatory Visit: Payer: Self-pay

## 2024-02-06 NOTE — Telephone Encounter (Signed)
 Left detailed message no answer

## 2024-02-10 ENCOUNTER — Ambulatory Visit (INDEPENDENT_AMBULATORY_CARE_PROVIDER_SITE_OTHER): Payer: Medicare (Managed Care)

## 2024-02-10 ENCOUNTER — Ambulatory Visit (INDEPENDENT_AMBULATORY_CARE_PROVIDER_SITE_OTHER): Payer: Medicare (Managed Care) | Admitting: Podiatry

## 2024-02-10 DIAGNOSIS — M778 Other enthesopathies, not elsewhere classified: Secondary | ICD-10-CM

## 2024-02-10 DIAGNOSIS — M7752 Other enthesopathy of left foot: Secondary | ICD-10-CM

## 2024-02-10 DIAGNOSIS — M7750 Other enthesopathy of unspecified foot: Secondary | ICD-10-CM

## 2024-02-11 ENCOUNTER — Ambulatory Visit: Payer: Self-pay | Admitting: *Deleted

## 2024-02-11 NOTE — Progress Notes (Signed)
 Subjective:  Patient ID: Monica Welch, female    DOB: 05-May-1961,  MRN: [id number]  Chief Complaint  Patient presents with   Foot Pain    Lkeft foot pain and swelling x 3 months. Burning sensation down lateral side of foot and arch pain. Epsom salt soaks being done. NIDDM A1C 8.9.    Discussed the use of AI scribe software for clinical note transcription with the patient, who gave verbal consent to proceed.  History of Present Illness Monica Welch is a 63 year old female with diabetes who presents with left foot pain.  She experiences constant pain in the arch and side of her left foot for the past two months, worsening as the day progresses. There is associated swelling, more pronounced by the end of the day. The pain is described as similar to 'when somebody backs up on you by accident.' There is no history of injury to the foot.  She experiences numbness and tingling in both feet, ongoing for an unspecified duration. She also experiences cramps in her leg and toes. Bearing weight on the left foot is painful.  Her medical history includes diabetes, with her last A1c recorded at 8.9 on August 4th, improved from 12 three months prior. She is currently taking Lyrica  (pregabalin ) for neuropathy. There are no wounds on her feet.      Objective:    Physical Exam General: AAO x3, NAD  Dermatological: Skin is warm, dry and supple bilateral.  There are no open sores, no preulcerative lesions, no rash or signs of infection present.  Vascular: Dorsalis Pedis artery and Posterior Tibial artery pedal pulses are 2/4 bilateral with immedate capillary fill time.  There is no pain with calf compression, swelling, warmth, erythema.   Neruologic: Sensation decreased with Semmes Weinstein monofilament.  Musculoskeletal: There is tenderness palpation of the arch of the foot as well as mildly to the midfoot area but there is no specific area pinpoint tenderness on the left side.  She  does get some discomfort on the course of the flexor tendons as well as the peroneal tendon.  Clinically the tendons were to be intact.  Mild edema is present but there is no erythema or warmth.  MMT 5/5.  Gait: Unassisted, Nonantalgic.     No images are attached to the encounter.    Results LABS A1c: 8.9 (01/26/2024)  RADIOLOGY Ankle X-ray: No acute fracture.  Likely old injury to the medial malleolus with ossicle present.  Calcaneal spurring is present.   Assessment:   1. Capsulitis of left foot   2. Tendonitis of ankle or foot      Plan:  Patient was evaluated and treated and all questions answered.  Assessment and Plan Assessment & Plan Left foot and ankle pain with swelling and tenderness - Prescribe CAM boot for immobilization for a few weeks. - Advise icing to reduce inflammation. - Apply ACE bandage for compression. - Instruct to elevate foot to prevent swelling. - Schedule follow-up in four weeks to reassess and consider physical therapy. - Swelling today.  No pain today with calf compression low suspicion for any DVT.  Consider venous reflux study if needed.  Type 2 diabetes mellitus with diabetic neuropathy Type 2 diabetes with improved HbA1c from 12 to 8.9. Diabetic neuropathy managed with pregabalin . - Continue current diabetes management and medication regimen. - Continue pregabalin  for neuropathy management.   Return in about 4 weeks (around 03/09/2024) for left ankle/foot pain and swelling.   Monica Welch  Monica Welch DPM

## 2024-02-11 NOTE — Progress Notes (Signed)
    Subjective:    Patient ID: Monica Welch, female    DOB: 1960-08-09, 63 y.o.   MRN: [id number]      HPI Emera is here for No chief complaint on file.   Symptoms started 3 days ago left sided facial numbness and heaviness.    Pred 60 x 5, taper over 5 Valtrex  if mod-seve weaknes Artificial tear, lub ointment - patch, tape  Medications and allergies reviewed with patient and updated if appropriate.  Current Outpatient Medications on File Prior to Visit  Medication Sig Dispense Refill   atorvastatin  (LIPITOR) 20 MG tablet Take 1 tablet (20 mg total) by mouth daily. 90 tablet 1   Blood Glucose Monitoring Suppl (ACCU-CHEK GUIDE ME) w/Device KIT      cyanocobalamin  (VITAMIN B12) 1000 MCG tablet Take 1 tablet (1,000 mcg total) by mouth daily. 90 tablet 3   DULoxetine  (CYMBALTA ) 60 MG capsule Take 1 capsule (60 mg total) by mouth daily. 90 capsule 1   glipiZIDE  (GLUCOTROL ) 10 MG tablet Take 1 tablet (10 mg total) by mouth 2 (two) times daily before a meal. 180 tablet 1   Glucose Blood (BLOOD GLUCOSE TEST STRIPS) STRP May substitute to any manufacturer covered by patient's insurance. 100 strip 5   Lancet Device MISC Use up to 3 times daily to check glucose. May substitute to any manufacturer covered by patient's insurance. 1 each 5   MOUNJARO  7.5 MG/0.5ML Pen SMARTSIG:7.5 Milligram(s) SUB-Q Once a Week     omeprazole  (PRILOSEC) 40 MG capsule TAKE 1 CAPSULE(40 MG) BY MOUTH DAILY - TAKE 30 MINUTES PRIOR TO A MEAL 90 capsule 1   pregabalin  (LYRICA ) 75 MG capsule Take 1 capsule (75 mg total) by mouth 2 (two) times daily. 180 capsule 0   tirzepatide  (MOUNJARO ) 10 MG/0.5ML Pen Inject 10 mg into the skin once a week. 6 mL 0   No current facility-administered medications on file prior to visit.    Review of Systems     Objective:  There were no vitals filed for this visit. BP Readings from Last 3 Encounters:  01/26/24 134/82  10/28/23 128/84  04/08/23 120/80   Wt Readings  from Last 3 Encounters:  01/26/24 184 lb (83.5 kg)  10/28/23 181 lb (82.1 kg)  10/24/23 175 lb (79.4 kg)   There is no height or weight on file to calculate BMI.    Physical Exam         Assessment & Plan:    See Problem List for Assessment and Plan of chronic medical problems.

## 2024-02-11 NOTE — Telephone Encounter (Signed)
 FYI Only or Action Required?: FYI only for provider.  Patient was last seen in primary care on 01/26/2024 by Geofm Glade PARAS, MD.  Called Nurse Triage reporting Numbness (Bells palsy/).  Symptoms began several days ago.  Interventions attempted: Nothing.  Symptoms are: gradually worsening.  Triage Disposition: Go to ED Now (or PCP Triage)  Patient/caregiver understands and will follow disposition?: declines ED- only wants to see PCP- appointment scheduled tomorrow am per patient request. Offered other options- patient declined.    Reason for Disposition  Bell's palsy suspected (i.e., weakness on only one side of the face, developing over hours to days, no other symptoms)  Answer Assessment - Initial Assessment Questions 1. SYMPTOM: What is the main symptom you are concerned about? (e.g., weakness, numbness)     Numbness, heaviness of face - left- hx Bells Palsy 2. ONSET: When did this start? (e.g., minutes, hours, days; while sleeping)     2 days ago- started feeling it 3. LAST NORMAL: When was the last time you (the patient) were normal (no symptoms)?     3 days ago 4. PATTERN Does this come and go, or has it been constant since it started?  Is it present now?     constant 5. CARDIAC SYMPTOMS: Have you had any of the following symptoms: chest pain, difficulty breathing, palpitations?     no 6. NEUROLOGIC SYMPTOMS: Have you had any of the following symptoms: headache, dizziness, vision loss, double vision, changes in speech, unsteady on your feet?     Loss of vision- left eye wants to close, mouth pulling down, drooling 7. OTHER SYMPTOMS: Do you have any other symptoms?     no  Protocols used: Neurologic [facility]   Copied from CRM #[id number]. Topic: Clinical - Red Word Triage >> Feb 11, 2024  9:56 AM Maisie C wrote: Red Word that prompted transfer to Nurse Triage: hxs of bell palsy, left side of face is now droopy, loss of vision, drooling, slurred words.

## 2024-02-12 ENCOUNTER — Encounter: Payer: Self-pay | Admitting: Internal Medicine

## 2024-02-12 ENCOUNTER — Ambulatory Visit (INDEPENDENT_AMBULATORY_CARE_PROVIDER_SITE_OTHER): Payer: Medicare (Managed Care) | Admitting: Internal Medicine

## 2024-02-12 VITALS — BP 112/66 | HR 83 | Temp 97.7°F | Ht 62.0 in | Wt 183.4 lb

## 2024-02-12 DIAGNOSIS — E1142 Type 2 diabetes mellitus with diabetic polyneuropathy: Secondary | ICD-10-CM

## 2024-02-12 DIAGNOSIS — Z7985 Long-term (current) use of injectable non-insulin antidiabetic drugs: Secondary | ICD-10-CM | POA: Diagnosis not present

## 2024-02-12 DIAGNOSIS — E119 Type 2 diabetes mellitus without complications: Secondary | ICD-10-CM

## 2024-02-12 DIAGNOSIS — G510 Bell's palsy: Secondary | ICD-10-CM

## 2024-02-12 MED ORDER — VALACYCLOVIR HCL 1 G PO TABS: 1000.0000 mg | ORAL_TABLET | Freq: Three times a day (TID) | ORAL | 0 refills | Status: AC

## 2024-02-12 MED ORDER — PREDNISONE 20 MG PO TABS: ORAL_TABLET | ORAL | 0 refills | Status: DC

## 2024-02-12 NOTE — Assessment & Plan Note (Signed)
 Chronic Not ideally controlled Lab Results  Component Value Date   HGBA1C 8.9 (H) 01/26/2024   Likely better since she has been on Mounjaro  Need to start high-dose steroids for Bell's palsy-discussed this will drive up her sugars, but we do not have a lot of options Continue Mounjaro  10 to mg weekly, glipizide  10 mg 2 times a day

## 2024-02-12 NOTE — Patient Instructions (Addendum)
 Medications changes include :   prednisone  taper, valtrex   Tape eye shut at night, lubricate eye several times a day   Return if symptoms worsen or fail to improve.     Bell's Palsy in Adults: What to Know  Bell's palsy is a condition that causes short-term weakness or paralysis of the muscles in a part of your face. With paralysis, you're not able to move the affected muscles. Bell's palsy happens when a nerve in your face, called the seventh cranial nerve, gets irritated, swollen, or has pressure put on it. This nerve runs along your skull, under your ear, and to the side of your face. It controls movements of your face like blinking, closing your eyes, smiling, and frowning. The symptoms of Bell's palsy may go away on their own in a few weeks. Treatment is sometimes needed. What are the causes? The exact cause of Bell's palsy isn't known. It may be caused by irritation and swelling or an infection from a virus, such as the chickenpox (herpes zoster), Epstein-Barr, or mumps virus. What increases the risk? You're more likely to get Bell's palsy if you: Are pregnant. Have diabetes. Have high blood pressure. Are obese. Have recently had an infection in your nose, throat, or upper airways. Other things that may trigger this condition include: A virus that's been in your body but hasn't been active. A weakened immune system, which is your body's defense system. It may be weak due to: Stress. Not getting enough sleep. Physical trauma or injury. A minor illness. Autoimmune syndromes. Infection or irritation and swelling of the facial nerve. Damage to the covering of the nerve fibers. What are the signs or symptoms? Symptoms of Bell's palsy include: Sudden weakness on one side of the face. Drooping of the eyelid, eyebrow, and corner of the mouth. Drooling from one side of the mouth. Having trouble closing the eyelid. If you're living with Bell's palsy, you may also  develop: Pain or unusual feelings in your face. A lot of tearing in one eye. Changes in how things taste. Being sensitive to sound in one ear. Pain behind one ear. Pain around one side of the jaw. Trouble eating or drinking. Most of the time, only one side of the face is affected. In rare cases, Bell's palsy may affect the whole face. How is this diagnosed? Diagnosis of Bell's palsy will include a physical exam. Your symptoms and medical history will be checked. You may also need to see health care providers who are specialists. This may be an expert in nerve disorders called a neurologist or an eye specialist called an ophthalmologist. You may have tests, such as: An electromyogram. This test checks for nerve damage. Imaging tests, such as a CT scan or an MRI. Blood tests. How is this treated? Bell's palsy affects every person differently. Sometimes symptoms go away without treatment within a few weeks. If treatment is needed, it varies from person to person. The goal of treatment is to reduce irritation and swelling and to protect the eye from damage. Treatment for Bell's palsy may include: Medicines, such as: Steroids to reduce irritation and swelling. Antiviral medicines. Medicines for pain, like aspirin , acetaminophen , or ibuprofen . Eye drops or ointment to keep your eye moist. Eye protection, if you can't close your eyelid. Physical therapy exercises to improve movement and strength in your face muscles. This may also include acupuncture or massage. Follow these instructions at home:  Take your medicines only as told. If your  eye is affected: Keep your eye moist with eye drops or ointment as told by your provider. Follow your provider's instructions for eye care and protection. Do any physical therapy exercises as told. Where to find more information American Academy of Ophthalmology (AAO): SeeTennis.com.ee Contact a health care provider if: You have a fever or chills. Your symptoms  don't get better within 2-3 weeks, or your symptoms get worse. Your eye is red, irritated, or painful. You have new symptoms. You feel light-headed. Get help right away if: You have weakness or numbness in a part of your body other than your face. You have trouble swallowing. You have neck pain or stiffness. You have shortness of breath. These symptoms may be an emergency. Call 911 right away. Do not wait to see if the symptoms will go away. Do not drive yourself to the hospital. This information is not intended to replace advice given to you by your health care provider. Make sure you discuss any questions you have with your health care provider. Document Revised: 11/11/2022 Document Reviewed: 11/11/2022 Elsevier Patient Education  2024 [employer].

## 2024-02-12 NOTE — Assessment & Plan Note (Signed)
 Acute Right side of face-started 2-3 days ago Start Valtrex  1 g 3 times daily x 7 days Start prednisone  taper 60 mg daily x 5 days then 1 day each show 40 mg, 30 mg, 20 mg, 10 mg Advised using lubricating eyedrops frequently throughout the day Tape eye shut at night Any changes in vision or eye concerns advised that she see her eye doctor If symptoms are not improving or worsening she will let me know

## 2024-02-16 ENCOUNTER — Ambulatory Visit: Payer: Medicare (Managed Care) | Admitting: Podiatry

## 2024-02-27 ENCOUNTER — Ambulatory Visit: Payer: Self-pay

## 2024-02-27 NOTE — Telephone Encounter (Signed)
 FYI Only or Action Required?: FYI only for provider.  Patient was last seen in primary care on 02/12/2024 by Geofm Glade PARAS, MD.  Called Nurse Triage reporting Neck Pain.  Symptoms began several days ago.  Symptoms are: gradually worsening.  Triage Disposition: Go to ED Now (or PCP Triage)  Patient/caregiver understands and will follow disposition?: Yes          Copied from CRM [id number]. Topic: Clinical - Red Word Triage >> Feb 27, 2024  3:16 PM Suzen RAMAN wrote: Red Word that prompted transfer to Nurse Triage: neck pain            Reason for Disposition  Bell's palsy suspected (i.e., weakness on only one side of the face, developing over hours to days, no other symptoms)  Answer Assessment - Initial Assessment Questions Patient seen recently and diagnosed with Bells' Palsy. Patient states symptoms have been worsening and she is now experiencing difficulty swallowing, neck pain/stiffness, and worsening difficulty with her speech. Patient advised to go to the ED due to these worsening symptoms.        1. SYMPTOM: What is the main symptom you are concerned about? (e.g., weakness, numbness)     Slurred speech, difficulty swallowing  2. ONSET: When did this start? (e.g., minutes, hours, days; while sleeping)     3 days  3. LAST NORMAL: When was the last time you (the patient) were normal (no symptoms)?     3 days ago  4. PATTERN Does this come and go, or has it been constant since it started?  Is it present now?     Constant  5. CARDIAC SYMPTOMS: Have you had any of the following symptoms: chest pain, difficulty breathing, palpitations?     No 6. NEUROLOGIC SYMPTOMS: Have you had any of the following symptoms: headache, dizziness, vision loss, double vision, changes in speech, unsteady on your feet?     Changes in speech  7. OTHER SYMPTOMS: Do you have any other symptoms?     Neck pain  Protocols used: Neurologic Deficit-A-AH

## 2024-02-28 ENCOUNTER — Encounter (HOSPITAL_COMMUNITY): Payer: Self-pay | Admitting: *Deleted

## 2024-02-28 ENCOUNTER — Other Ambulatory Visit: Payer: Self-pay

## 2024-02-28 ENCOUNTER — Emergency Department (HOSPITAL_COMMUNITY)
Admission: EM | Admit: 2024-02-28 | Discharge: 2024-02-28 | Disposition: A | Discharge status: Home / Self Care | Payer: Medicare (Managed Care) | Attending: Emergency Medicine | Admitting: Emergency Medicine

## 2024-02-28 ENCOUNTER — Emergency Department (HOSPITAL_COMMUNITY): Payer: Medicare (Managed Care)

## 2024-02-28 DIAGNOSIS — G43909 Migraine, unspecified, not intractable, without status migrainosus: Secondary | ICD-10-CM | POA: Insufficient documentation

## 2024-02-28 DIAGNOSIS — G510 Bell's palsy: Secondary | ICD-10-CM | POA: Diagnosis not present

## 2024-02-28 DIAGNOSIS — R29810 Facial weakness: Secondary | ICD-10-CM | POA: Diagnosis present

## 2024-02-28 LAB — BASIC METABOLIC PANEL WITH GFR
Anion gap: 12 (ref 5–15)
BUN: 20 mg/dL (ref 8–23)
CO2: 21 mmol/L — ABNORMAL LOW (ref 22–32)
Calcium: 9.2 mg/dL (ref 8.9–10.3)
Chloride: 105 mmol/L (ref 98–111)
Creatinine, Ser: 1.27 mg/dL — ABNORMAL HIGH (ref 0.44–1.00)
GFR, Estimated: 48 mL/min — ABNORMAL LOW (ref >60)
Glucose, Bld: 183 mg/dL — ABNORMAL HIGH (ref 70–99)
Potassium: 4.7 mmol/L (ref 3.5–5.1)
Sodium: 138 mmol/L (ref 135–145)

## 2024-02-28 LAB — CBC WITH DIFFERENTIAL/PLATELET
Abs Immature Granulocytes: 0.07 K/uL (ref 0.00–0.07)
Basophils Absolute: 0.1 K/uL (ref 0.0–0.1)
Basophils Relative: 0 %
Eosinophils Absolute: 0.2 K/uL (ref 0.0–0.5)
Eosinophils Relative: 2 %
HCT: 52 % — ABNORMAL HIGH (ref 36.0–46.0)
Hemoglobin: 16.7 g/dL — ABNORMAL HIGH (ref 12.0–15.0)
Immature Granulocytes: 1 %
Lymphocytes Relative: 21 %
Lymphs Abs: 2.6 K/uL (ref 0.7–4.0)
MCH: 30.3 pg (ref 26.0–34.0)
MCHC: 32.1 g/dL (ref 30.0–36.0)
MCV: 94.2 fL (ref 80.0–100.0)
Monocytes Absolute: 0.9 K/uL (ref 0.1–1.0)
Monocytes Relative: 7 %
Neutro Abs: 8.7 K/uL — ABNORMAL HIGH (ref 1.7–7.7)
Neutrophils Relative %: 69 %
Platelets: 230 K/uL (ref 150–400)
RBC: 5.52 MIL/uL — ABNORMAL HIGH (ref 3.87–5.11)
RDW: 14 % (ref 11.5–15.5)
Smear Review: NORMAL
WBC: 12.4 K/uL — ABNORMAL HIGH (ref 4.0–10.5)
nRBC: 0 % (ref 0.0–0.2)

## 2024-02-28 MED ORDER — DIAZEPAM 5 MG/ML IJ SOLN
2.5000 mg | Freq: Once | INTRAMUSCULAR | Status: AC
Start: 2024-02-28 — End: 2024-02-28
  Administered 2024-02-28: 2.5 mg via INTRAVENOUS
  Filled 2024-02-28: qty 2

## 2024-02-28 MED ORDER — FLUORESCEIN SODIUM 1 MG OP STRP
1.0000 | ORAL_STRIP | Freq: Once | OPHTHALMIC | Status: AC
  Administered 2024-02-28: 1 via OPHTHALMIC
  Filled 2024-02-28: qty 1

## 2024-02-28 MED ORDER — PROPARACAINE HCL 0.5 % OP SOLN
1.0000 [drp] | Freq: Once | OPHTHALMIC | Status: AC
  Administered 2024-02-28: 1 [drp] via OPHTHALMIC
  Filled 2024-02-28: qty 15

## 2024-02-28 MED ORDER — PROCHLORPERAZINE EDISYLATE 10 MG/2ML IJ SOLN
5.0000 mg | Freq: Once | INTRAMUSCULAR | Status: AC
  Administered 2024-02-28: 5 mg via INTRAVENOUS
  Filled 2024-02-28: qty 2

## 2024-02-28 MED ORDER — GADOBUTROL 1 MMOL/ML IV SOLN
8.0000 mL | Freq: Once | INTRAVENOUS | Status: AC | PRN
  Administered 2024-02-28: 8 mL via INTRAVENOUS

## 2024-02-28 MED ORDER — KETOROLAC TROMETHAMINE 30 MG/ML IJ SOLN
15.0000 mg | Freq: Once | INTRAMUSCULAR | Status: AC
  Administered 2024-02-28: 15 mg via INTRAVENOUS
  Filled 2024-02-28: qty 1

## 2024-02-28 MED ORDER — DIPHENHYDRAMINE HCL 50 MG/ML IJ SOLN
12.5000 mg | Freq: Once | INTRAMUSCULAR | Status: AC
  Administered 2024-02-28: 12.5 mg via INTRAVENOUS
  Filled 2024-02-28: qty 1

## 2024-02-28 NOTE — ED Triage Notes (Signed)
 Patient has bells palsy and states her symptoms are getting worse. Patient presents with headache, twisting of her muscles and facial droop. States she saw her Dr on 8/21 and the symptoms began before that.

## 2024-02-28 NOTE — Discharge Instructions (Signed)
 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 You were most likely also having a migraine headache  ### Bell's Palsy Patient Handout     Bell's palsy is a condition that causes sudden weakness or paralysis on one side of the face. It happens when the nerve that controls facial muscles becomes inflamed. The exact cause is not known, but it may be related to a viral infection, such as herpes simplex virus, or swelling of the nerve.[1][2]      **Symptoms**      - Drooping on one side of the face      - Trouble closing the eye or smiling      - Loss of taste on the front of the tongue      - Pain around the ear      - Increased tearing or dry eye      **Diagnosis**      Bell's palsy is usually diagnosed by a doctor based on symptoms and a physical exam. Most of the time, no special tests are needed. Sometimes, tests may be done to rule out other causes of facial weakness.[3][4]      **Prognosis**      Most people with Bell's palsy get better on their own. About 70-85% recover completely within weeks to months. Children and pregnant women often recover even better. Some people may have lasting weakness or movement problems in the face.[3][5][1]      **Treatment**      - **Steroids (Corticosteroids):** The main treatment is a medicine called a corticosteroid, such as prednisone . This helps reduce swelling and increases the chance of full recovery. It works best if started within 3 days of symptoms. A typical dose is prednisone  50-60 mg per day for 5 days, then slowly reduced over the next 5 days.[3][6][7][8][4]      - **Antiviral Medicines:** Sometimes, an antiviral medicine (like valacyclovir  or acyclovir) may be added, especially if the doctor thinks a virus is involved. The benefit of antivirals is small and not proven for everyone, but they may help in severe cases. Antivirals alone are not recommended.[8][3][9][1][10]      - **Eye Care:** If the eye  cannot close, it is important to protect it. Use artificial tears during the day and ointment at night. An eye patch or moisture shield may be needed to prevent dryness and injury.[6][4]      - **Physical Therapy:** Gentle facial exercises or physical therapy may help if weakness lasts longer or is severe.[3][11]      **What to Expect**      Most people start to improve within a few weeks. Full recovery can take up to 6 months. If symptoms do not improve or get worse, follow up with your doctor.      **When to Seek Help**      Call your doctor or go to the emergency room if:      - You have weakness in both sides of the face      - You have trouble speaking, swallowing, or walking      - You have severe pain, rash, or hearing loss      **Living with Bell's Palsy**      Bell's palsy can be scary, but most people recover well. Protect your eye, take medicines as prescribed, and keep follow-up appointments. If you have lasting problems, treatments are available to help with movement and appearance.[11][2]      If  you have questions, talk to your healthcare provider.      ### References  1. Combined Corticosteroid and Antiviral Treatment for Bell Palsy: A Systematic Review and Meta-analysis. de Dudley, Al Khabori M, [facility], et al. JAMA. 2009;302(9):985-93. doi:10.1001/jama.7990.8756. 2. Bell's Palsy: Etiology, Management and Dental Implications. Danesh A, Ouanounou A. Journal ([employer] Dental Association). 2022;88:m8. 3. Bell Palsy: Rapid Evidence Review. Dalrymple SN, Row JH, USAA. American Family Physician. 2023;107(4):415-420. 4. Clinical Practice Guideline: Bell's Palsy. Baugh RF, [place] AZZIE Alex LADORA, et al. Otolaryngology--Head and Neck Surgery : Official Journal of American Academy of Otolaryngology-Head and Neck Surgery. 7986;850(6 Suppl):S1-27. doi:10.1177/0194599813505967. 5. Evaluation of Factors Associated With Favorable Outcomes in Adults With Bell Palsy. Georgian BROOK,  Soh Y, Chon J, et al. JAMA Otolaryngology-- Head & Neck Surgery. 2020;146(3):256-263. doi:10.1001/jamaoto.7980.5687. 6. The Diagnosis and Treatment of Idiopathic Facial Paresis (Bell's Palsy). Heckmann JG, Urban PP, Pitz S, Guntinas-Lichius O, Ggyor I. [employer]. 2019;116(41):692-702. doi:10.3238/arztebl.7980.9307. 7. Corticosteroids for Bell's Palsy (Idiopathic Facial Paralysis). Madhok VB, Gagyor I, Joshua FALCON, et al. The Cochrane Database of Systematic Reviews. 2016;7:CD001942. doi:10.1002/14651858.RI998057.pub5. 8. Evidence-Based Guideline Update: Steroids and Antivirals for Bell Palsy: Report of the Guideline Development Subcommittee of the American Academy of Neurology. Gronseth GS, Paduga R. Neurology. 2012;79(22):2209-13. doi:10.1212/WNL.[id number] c. 9. Antiviral Treatment for Bell's Palsy (Idiopathic Facial Paralysis). Gagyor I, Madhok VB, Joshua FALCON Floretta FALCON. The Cochrane Database of Systematic Reviews. 2019;9:CD001869. doi:10.1002/14651858.RI998130.ela0. 10. Antiviral Agents Added to Corticosteroids for Early Treatment of Adults With Acute Idiopathic Facial Nerve Paralysis (Bell Palsy). Floretta FALCON Joshua FALCON, Gagyor I. JAMA. 2016 Aug 23-30;316(8):874-5. doi:10.1001/jama.7983.89839. 11. Bell's Palsy: Aetiology, Clinical Features and Multidisciplinary Care. Eviston TJ, Croxson GR, Kennedy PG, [place], [place] AV. Journal of Neurology, Neurosurgery, and Psychiatry. 2015;86(12):1356-61. doi:10.1136/jnnp-2014-309563.

## 2024-02-28 NOTE — ED Provider Notes (Signed)
 [place] EMERGENCY DEPARTMENT AT [facility] Provider Note   CSN: [id number] Arrival date & time: 02/28/24  8091     Patient presents with: Headache   Monica Welch is a 63 y.o. female.  {Add pertinent medical, surgical, social history, OB history to HPI:32947}  Headache      Prior to Admission medications   Medication Sig Start Date End Date Taking? Authorizing Provider  atorvastatin  (LIPITOR) 20 MG tablet Take 1 tablet (20 mg total) by mouth daily. 10/28/23   Geofm Glade PARAS, MD  Blood Glucose Monitoring Suppl (ACCU-CHEK GUIDE ME) w/Device KIT  02/04/24   [provider]  cyanocobalamin  (VITAMIN B12) 1000 MCG tablet Take 1 tablet (1,000 mcg total) by mouth daily. 10/28/23   Geofm Glade PARAS, MD  DULoxetine  (CYMBALTA ) 60 MG capsule Take 1 capsule (60 mg total) by mouth daily. 10/28/23   Geofm Glade PARAS, MD  glipiZIDE  (GLUCOTROL ) 10 MG tablet Take 1 tablet (10 mg total) by mouth 2 (two) times daily before a meal. 04/08/23   Burns, Glade PARAS, MD  Glucose Blood (BLOOD GLUCOSE TEST STRIPS) STRP May substitute to any manufacturer covered by patient's insurance. 02/04/24   Geofm Glade PARAS, MD  Lancet Device MISC Use up to 3 times daily to check glucose. May substitute to any manufacturer covered by patient's insurance. 02/04/24   Geofm Glade PARAS, MD  MOUNJARO  7.5 MG/0.5ML Pen SMARTSIG:7.5 Milligram(s) SUB-Q Once a Week 01/26/24   [provider]  omeprazole  (PRILOSEC) 40 MG capsule TAKE 1 CAPSULE(40 MG) BY MOUTH DAILY - TAKE 30 MINUTES PRIOR TO A MEAL 10/28/23   Burns, Glade PARAS, MD  predniSONE  (DELTASONE ) 20 MG tablet Take 3 tabs daily with food x 5 days, then 2 tabs daily x 1 day, then 1.5 tab daily x 1 day, 1 tab daily x 1 day then 0.5 tab daily x 1 day 02/12/24   Geofm Glade PARAS, MD  pregabalin  (LYRICA ) 75 MG capsule Take 1 capsule (75 mg total) by mouth 2 (two) times daily. 10/28/23   Geofm Glade PARAS, MD  tirzepatide  (MOUNJARO ) 10 MG/0.5ML Pen Inject 10 mg into the skin once a week.  01/26/24   Geofm Glade PARAS, MD    Allergies: Iohexol , Gabapentin, Iodine, and Metformin  and related    Review of Systems  Neurological:  Positive for headaches.    Updated Vital Signs BP (!) 149/91 (BP Location: Right Arm)   Pulse (!) 111   Temp 98.3 F (36.8 C)   Resp 16   Ht 5' 2 (1.575 m)   Wt 83.2 kg   SpO2 95%   BMI 33.55 kg/m   Physical Exam  (all labs ordered are listed, but only abnormal results are displayed) Labs Reviewed - No data to display  EKG: None  Radiology: No results found.  {Document cardiac monitor, telemetry assessment procedure when appropriate:32947} Procedures   Medications Ordered in the ED - No data to display    {Click here for ABCD2, HEART and other calculators REFRESH Note before signing:1}                              Medical Decision Making  ***  {Document critical care time when appropriate  Document review of labs and clinical decision tools ie CHADS2VASC2, etc  Document your independent review of radiology images and any outside records  Document your discussion with family members, caretakers and with consultants  Document social determinants  of health affecting pt's care  Document your decision making why or why not admission, treatments were needed:32947:::1}   Final diagnoses:  None    ED Discharge Orders     None

## 2024-03-05 ENCOUNTER — Ambulatory Visit (INDEPENDENT_AMBULATORY_CARE_PROVIDER_SITE_OTHER): Payer: Medicare (Managed Care) | Admitting: Internal Medicine

## 2024-03-05 ENCOUNTER — Encounter: Payer: Self-pay | Admitting: Internal Medicine

## 2024-03-05 VITALS — BP 120/78 | HR 82 | Temp 98.8°F | Ht 62.0 in | Wt 184.0 lb

## 2024-03-05 DIAGNOSIS — R519 Headache, unspecified: Secondary | ICD-10-CM | POA: Diagnosis not present

## 2024-03-05 DIAGNOSIS — G510 Bell's palsy: Secondary | ICD-10-CM | POA: Diagnosis not present

## 2024-03-05 DIAGNOSIS — L03114 Cellulitis of left upper limb: Secondary | ICD-10-CM | POA: Diagnosis not present

## 2024-03-05 DIAGNOSIS — I808 Phlebitis and thrombophlebitis of other sites: Secondary | ICD-10-CM | POA: Diagnosis not present

## 2024-03-05 MED ORDER — PREDNISONE 10 MG PO TABS: ORAL_TABLET | ORAL | 0 refills | Status: DC

## 2024-03-05 MED ORDER — PREGABALIN 100 MG PO CAPS: 100.0000 mg | ORAL_CAPSULE | Freq: Two times a day (BID) | ORAL | 0 refills | Status: AC

## 2024-03-05 MED ORDER — CEPHALEXIN 500 MG PO CAPS: 500.0000 mg | ORAL_CAPSULE | Freq: Three times a day (TID) | ORAL | 0 refills | Status: AC

## 2024-03-05 NOTE — Progress Notes (Signed)
 Subjective:    Patient ID: Monica Welch, female    DOB: [birth year], 63 y.o.   MRN: [id number]      HPI Monica Welch is here for  Chief Complaint  Patient presents with   Follow-up    ED follow up    Discussed the use of AI scribe software for clinical note transcription with the patient, who gave verbal consent to proceed.  History of Present Illness Monica Welch is a 64 year old female who presents with persistent right-sided headache and facial pain.  She has been experiencing a persistent headache that began a few days before her emergency room visit on February 28, 2024. The headache is described as a constant, severe pain with a sensation of pressure, primarily affecting the right side of her face and neck. She likens the pain to needing a root canal, despite having no teeth. The pain is unrelieved by Aleve  or Tylenol  and only temporarily subsided with a migraine cocktail and Benadryl  administered in the emergency room.  She experiences blurred vision in her right eye, which is related to the Bell's palsy and notes that her mouth appears slightly better and her right eye is closing more. No lightheadedness, dizziness, fever, or nausea, except for one instance of vomiting which provided temporary relief. There is no numbness or tingling in the face, and the pain does not radiate down her right arm.  She has been taking Lyrica -chronically on 75 mg twice daily, but it has not been effective for her current pain.   The pain starts at the neck and radiates upwards to her forehead.  She also has pain in the right lower face.  She denies any left sided facial or head pain    She also mentions a recent IV site on her left arm that has become painful and red.  She reports difficulty sleeping due to the pain, with the best rest occurring in the hospital after receiving Benadryl .       Medications and allergies reviewed with patient and updated if  appropriate.  Current Outpatient Medications on File Prior to Visit  Medication Sig Dispense Refill   atorvastatin  (LIPITOR) 20 MG tablet Take 1 tablet (20 mg total) by mouth daily. 90 tablet 1   Blood Glucose Monitoring Suppl (ACCU-CHEK GUIDE ME) w/Device KIT      cyanocobalamin  (VITAMIN B12) 1000 MCG tablet Take 1 tablet (1,000 mcg total) by mouth daily. 90 tablet 3   DULoxetine  (CYMBALTA ) 60 MG capsule Take 1 capsule (60 mg total) by mouth daily. 90 capsule 1   glipiZIDE  (GLUCOTROL ) 10 MG tablet Take 1 tablet (10 mg total) by mouth 2 (two) times daily before a meal. 180 tablet 1   Glucose Blood (BLOOD GLUCOSE TEST STRIPS) STRP May substitute to any manufacturer covered by patient's insurance. 100 strip 5   Lancet Device MISC Use up to 3 times daily to check glucose. May substitute to any manufacturer covered by patient's insurance. 1 each 5   MOUNJARO  7.5 MG/0.5ML Pen SMARTSIG:7.5 Milligram(s) SUB-Q Once a Week     omeprazole  (PRILOSEC) 40 MG capsule TAKE 1 CAPSULE(40 MG) BY MOUTH DAILY - TAKE 30 MINUTES PRIOR TO A MEAL 90 capsule 1   predniSONE  (DELTASONE ) 20 MG tablet Take 3 tabs daily with food x 5 days, then 2 tabs daily x 1 day, then 1.5 tab daily x 1 day, 1 tab daily x 1 day then 0.5 tab daily x 1 day 20 tablet 0  pregabalin  (LYRICA ) 75 MG capsule Take 1 capsule (75 mg total) by mouth 2 (two) times daily. 180 capsule 0   tirzepatide  (MOUNJARO ) 10 MG/0.5ML Pen Inject 10 mg into the skin once a week. 6 mL 0   No current facility-administered medications on file prior to visit.    Review of Systems  Constitutional:  Negative for fever.  HENT:  Positive for sinus pressure.   Eyes:  Positive for visual disturbance (blurry - right eye).  Gastrointestinal:  Negative for nausea (none since friday).  Neurological:  Positive for headaches (right neck and whole right side of head). Negative for dizziness, light-headedness and numbness.       Objective:   Vitals:   03/05/24 1056  BP:  120/78  Pulse: 82  Temp: 98.8 F (37.1 C)  SpO2: 94%   BP Readings from Last 3 Encounters:  03/05/24 120/78  02/28/24 114/79  02/12/24 112/66   Wt Readings from Last 3 Encounters:  03/05/24 184 lb (83.5 kg)  02/28/24 183 lb 6.8 oz (83.2 kg)  02/12/24 183 lb 6 oz (83.2 kg)   Body mass index is 33.65 kg/m.    Physical Exam Constitutional:      General: She is not in acute distress.    Appearance: Normal appearance. She is not ill-appearing.  HENT:     Head: Normocephalic and atraumatic.     Right Ear: Tympanic membrane, ear canal and external ear normal. There is no impacted cerumen.     Left Ear: Tympanic membrane, ear canal and external ear normal. There is no impacted cerumen.  Musculoskeletal:        General: Tenderness (Right posterior occipital region) present. No swelling or deformity.  Skin:    General: Skin is warm and dry.     Findings: No rash.  Neurological:     Mental Status: She is alert.     Cranial Nerves: Cranial nerve deficit present.     Sensory: No sensory deficit.     Coordination: Abnormal coordination: .apsec.     Comments: Right-sided facial weakness-persistent  Psychiatric:        Behavior: Behavior normal.        Thought Content: Thought content normal.            Assessment & Plan:    Assessment and Plan Assessment & Plan Right-sided neuropathic headache and facial/neck pain Sudden onset right-sided neuropathic headache and facial/neck pain, severe and pressure-like, not relieved by Aleve  or Tylenol . Imaging normal in emergency room. Suspected nerve-related pain, possibly due to inflammation. Current pregabalin  insufficient. - Increase pregabalin  to 100 mg twice daily. - Initiate steroid taper to reduce inflammation and nerve irritation.  Prednisone  40 mg daily x 3 days, 30 mg x 3 days, 20 mg x 3 days, 10 mg x 3 days - Consider Topamax if headaches persist.  Bell's palsy Improvement in facial weakness with better eye closure, but  residual pain and weakness persist on the right side of the face.  Superficial phlebitis and cellulitis of left upper limb Developed superficial phlebitis and cellulitis in the left upper limb, from recent IV insertion. Area red and painful, indicating possible infection and superficial blood clot. - Prescribe antibiotics for cellulitis-Keflex  500 mg 3 times daily. - Apply warm compresses to resolve blood clot.

## 2024-03-05 NOTE — Patient Instructions (Addendum)
      Medications changes include :   prednisone  taper, increase lyrica  to 100 mg twice daily,  keflex  for the arm    Apply warm compresses on your left arm.      Return if symptoms worsen or fail to improve.

## 2024-03-09 ENCOUNTER — Ambulatory Visit: Payer: Medicare (Managed Care) | Admitting: Podiatry

## 2024-04-26 ENCOUNTER — Encounter: Payer: Self-pay | Admitting: Internal Medicine

## 2024-04-26 NOTE — Patient Instructions (Addendum)
      Blood work was ordered.       Medications changes include :   methocarbamol - muscle relaxer and increase omeprazole  to twice a day.     Return in about 3 months (around 07/28/2024) for follow up.

## 2024-04-26 NOTE — Progress Notes (Signed)
 "     Subjective:    Patient ID: Monica Welch, female    DOB: 06-Jul-1960, 63 y.o.   MRN: [id number]     HPI Monica Welch is here for follow up of her chronic medical problems.  Bells palsy a little better, but not much.    Sugars in 100's.  Doing better with watching the sugars/carbs.    Discussed the use of AI scribe software for clinical note transcription with the patient, who gave verbal consent to proceed.  History of Present Illness Monica Welch is a 63 year old female with diabetes and anxiety who presents with concerns about blood sugar control and anxiety management.  She has been experiencing fluctuations in her blood sugar levels, with recent readings mostly in the 100s, which is an improvement from her usual 200s. A specific instance of a reading at 140 was particularly encouraging. She is on a weekly injection and experiences no nausea, constipation, or stomach pain associated with it.   Her anxiety is exacerbated by her living situation with her father-in-law, whom she perceives as demanding and critical. This contributes to her stress and anxiety, impacting her overall well-being. She feels overwhelmed by the household dynamics, including financial contributions and responsibilities. Her sleep pattern is irregular, as she sleeps during the day and stays awake at night, attributing this to anxiety and household stress. She frequently feels cold and experiences headaches, which she attributes to stress from her living situation.  She experiences a persistent cough that worsens when lying down, which she associates with heartburn. Additionally, she reports leg swelling, particularly at night, which she manages by elevating her legs. There is a sensation of fullness and discomfort in her legs, described as feeling like they 'want to crack open.'  She has a history of Bell's palsy, which has shown slight improvement. She is concerned about her weight, noting  stability around 180 pounds, though she feels she has gained weight. She is mindful of her diet, avoiding sweets, though she occasionally indulges in small amounts. She is taking pregabalin  and inquires about muscle relaxers for shoulder pain and muscle swelling, which she experiences intermittently.    Medications and allergies reviewed with patient and updated if appropriate.  Current Outpatient Medications on File Prior to Visit  Medication Sig Dispense Refill   atorvastatin  (LIPITOR) 20 MG tablet Take 1 tablet (20 mg total) by mouth daily. 90 tablet 1   Blood Glucose Monitoring Suppl (ACCU-CHEK GUIDE ME) w/Device KIT      cyanocobalamin  (VITAMIN B12) 1000 MCG tablet Take 1 tablet (1,000 mcg total) by mouth daily. 90 tablet 3   DULoxetine  (CYMBALTA ) 60 MG capsule Take 1 capsule (60 mg total) by mouth daily. 90 capsule 1   glipiZIDE  (GLUCOTROL ) 10 MG tablet Take 1 tablet (10 mg total) by mouth 2 (two) times daily before a meal. 180 tablet 1   Glucose Blood (BLOOD GLUCOSE TEST STRIPS) STRP May substitute to any manufacturer covered by patient's insurance. 100 strip 5   Lancet Device MISC Use up to 3 times daily to check glucose. May substitute to any manufacturer covered by patient's insurance. 1 each 5   omeprazole  (PRILOSEC) 40 MG capsule TAKE 1 CAPSULE(40 MG) BY MOUTH DAILY - TAKE 30 MINUTES PRIOR TO A MEAL 90 capsule 1   pregabalin  (LYRICA ) 100 MG capsule Take 1 capsule (100 mg total) by mouth 2 (two) times daily. 60 capsule 0   tirzepatide  (MOUNJARO ) 10 MG/0.5ML Pen Inject 10 mg into the  skin once a week. 6 mL 0   No current facility-administered medications on file prior to visit.     Review of Systems  Constitutional:  Negative for fever.  Respiratory:  Positive for cough. Negative for shortness of breath and wheezing.   Cardiovascular:  Positive for leg swelling. Negative for chest pain and palpitations.  Gastrointestinal:  Negative for abdominal pain, constipation and nausea.        Monica Welch 2/week  Musculoskeletal:        Muscle spasms intermittent    Neurological:  Negative for light-headedness and headaches.       Objective:   Vitals:   04/27/24 1340  BP: 122/82  Pulse: 94  Temp: 98 F (36.7 C)  SpO2: 95%   BP Readings from Last 3 Encounters:  04/27/24 122/82  03/05/24 120/78  02/28/24 114/79   Wt Readings from Last 3 Encounters:  04/27/24 180 lb (81.6 kg)  03/05/24 184 lb (83.5 kg)  02/28/24 183 lb 6.8 oz (83.2 kg)   Body mass index is 32.92 kg/m.    Physical Exam Constitutional:      General: She is not in acute distress.    Appearance: Normal appearance.  HENT:     Head: Normocephalic and atraumatic.  Eyes:     Conjunctiva/sclera: Conjunctivae normal.  Cardiovascular:     Rate and Rhythm: Normal rate and regular rhythm.     Heart sounds: Normal heart sounds.  Pulmonary:     Effort: Pulmonary effort is normal. No respiratory distress.     Breath sounds: Normal breath sounds. No wheezing.  Musculoskeletal:     Cervical back: Neck supple.     Right lower leg: Edema (trace) present.     Left lower leg: Edema (trace) present.  Lymphadenopathy:     Cervical: No cervical adenopathy.  Skin:    General: Skin is warm and dry.     Findings: No rash.  Neurological:     Mental Status: She is alert. Mental status is at baseline.     Cranial Nerves: Cranial nerve deficit (right facial weakness from bells palsy) present.  Psychiatric:        Mood and Affect: Mood normal.        Behavior: Behavior normal.       Diabetic Foot Exam - Simple   Simple Foot Form Diabetic Foot exam was performed with the following findings: Yes 04/27/2024  2:07 PM  Visual Inspection No deformities, no ulcerations, no other skin breakdown bilaterally: Yes See comments: Yes Sensation Testing See comments: Yes Pulse Check Posterior Tibialis and Dorsalis pulse intact bilaterally: Yes Comments Slightly decreased sensation b/l,  plantar wart left plantar surface       Lab Results  Component Value Date   WBC 12.4 (H) 02/28/2024   HGB 16.7 (H) 02/28/2024   HCT 52.0 (H) 02/28/2024   PLT 230 02/28/2024   GLUCOSE 183 (H) 02/28/2024   CHOL 149 01/26/2024   TRIG 154.0 (H) 01/26/2024   HDL 37.50 (L) 01/26/2024   LDLDIRECT 135.0 03/04/2022   LDLCALC 81 01/26/2024   ALT 13 01/26/2024   AST 11 01/26/2024   NA 138 02/28/2024   K 4.7 02/28/2024   CL 105 02/28/2024   CREATININE 1.27 (H) 02/28/2024   BUN 20 02/28/2024   CO2 21 (L) 02/28/2024   TSH 1.25 03/04/2022   INR 1.0 05/06/2022   HGBA1C 8.9 (H) 01/26/2024   MICROALBUR 4.2 (H) 10/28/2023     Assessment & Plan:  See Problem List for Assessment and Plan of chronic medical problems.    "

## 2024-04-27 ENCOUNTER — Ambulatory Visit (INDEPENDENT_AMBULATORY_CARE_PROVIDER_SITE_OTHER): Payer: Medicare (Managed Care) | Admitting: Internal Medicine

## 2024-04-27 VITALS — BP 122/82 | HR 94 | Temp 98.0°F | Ht 62.0 in | Wt 180.0 lb

## 2024-04-27 DIAGNOSIS — E785 Hyperlipidemia, unspecified: Secondary | ICD-10-CM

## 2024-04-27 DIAGNOSIS — G510 Bell's palsy: Secondary | ICD-10-CM

## 2024-04-27 DIAGNOSIS — M62838 Other muscle spasm: Secondary | ICD-10-CM | POA: Insufficient documentation

## 2024-04-27 DIAGNOSIS — F32A Depression, unspecified: Secondary | ICD-10-CM | POA: Diagnosis not present

## 2024-04-27 DIAGNOSIS — E538 Deficiency of other specified B group vitamins: Secondary | ICD-10-CM

## 2024-04-27 DIAGNOSIS — K219 Gastro-esophageal reflux disease without esophagitis: Secondary | ICD-10-CM

## 2024-04-27 DIAGNOSIS — Z1211 Encounter for screening for malignant neoplasm of colon: Secondary | ICD-10-CM

## 2024-04-27 DIAGNOSIS — F419 Anxiety disorder, unspecified: Secondary | ICD-10-CM

## 2024-04-27 DIAGNOSIS — E1140 Type 2 diabetes mellitus with diabetic neuropathy, unspecified: Secondary | ICD-10-CM | POA: Diagnosis not present

## 2024-04-27 DIAGNOSIS — D751 Secondary polycythemia: Secondary | ICD-10-CM

## 2024-04-27 DIAGNOSIS — Z7984 Long term (current) use of oral hypoglycemic drugs: Secondary | ICD-10-CM

## 2024-04-27 DIAGNOSIS — E1142 Type 2 diabetes mellitus with diabetic polyneuropathy: Secondary | ICD-10-CM

## 2024-04-27 DIAGNOSIS — E1169 Type 2 diabetes mellitus with other specified complication: Secondary | ICD-10-CM

## 2024-04-27 LAB — CBC WITH DIFFERENTIAL/PLATELET
Basophils Absolute: 0.1 K/uL (ref 0.0–0.1)
Basophils Relative: 0.8 % (ref 0.0–3.0)
Eosinophils Absolute: 0.4 K/uL (ref 0.0–0.7)
Eosinophils Relative: 4.4 % (ref 0.0–5.0)
HCT: 45.6 % (ref 36.0–46.0)
Hemoglobin: 15.2 g/dL — ABNORMAL HIGH (ref 12.0–15.0)
Lymphocytes Relative: 22.4 % (ref 12.0–46.0)
Lymphs Abs: 2.3 K/uL (ref 0.7–4.0)
MCHC: 33.3 g/dL (ref 30.0–36.0)
MCV: 91.6 fl (ref 78.0–100.0)
Monocytes Absolute: 0.5 K/uL (ref 0.1–1.0)
Monocytes Relative: 5.2 % (ref 3.0–12.0)
Neutro Abs: 6.8 K/uL (ref 1.4–7.7)
Neutrophils Relative %: 67.2 % (ref 43.0–77.0)
Platelets: 235 K/uL (ref 150.0–400.0)
RBC: 4.98 Mil/uL (ref 3.87–5.11)
RDW: 13.8 % (ref 11.5–15.5)
WBC: 10.2 K/uL (ref 4.0–10.5)

## 2024-04-27 LAB — COMPREHENSIVE METABOLIC PANEL WITH GFR
ALT: 26 U/L (ref 0–35)
AST: 22 U/L (ref 0–37)
Albumin: 4 g/dL (ref 3.5–5.2)
Alkaline Phosphatase: 46 U/L (ref 39–117)
BUN: 15 mg/dL (ref 6–23)
CO2: 27 meq/L (ref 19–32)
Calcium: 9.4 mg/dL (ref 8.4–10.5)
Chloride: 103 meq/L (ref 96–112)
Creatinine, Ser: 0.93 mg/dL (ref 0.40–1.20)
GFR: 65.47 mL/min (ref >60.00)
Glucose, Bld: 150 mg/dL — ABNORMAL HIGH (ref 70–99)
Potassium: 3.9 meq/L (ref 3.5–5.1)
Sodium: 139 meq/L (ref 135–145)
Total Bilirubin: 0.7 mg/dL (ref 0.2–1.2)
Total Protein: 7.4 g/dL (ref 6.0–8.3)

## 2024-04-27 LAB — HEMOGLOBIN A1C: Hgb A1c MFr Bld: 8.4 % — ABNORMAL HIGH (ref 4.6–6.5)

## 2024-04-27 MED ORDER — OMEPRAZOLE 40 MG PO CPDR: 40.0000 mg | DELAYED_RELEASE_CAPSULE | Freq: Two times a day (BID) | ORAL | 1 refills | Status: DC

## 2024-04-27 MED ORDER — METHOCARBAMOL 500 MG PO TABS: 500.0000 mg | ORAL_TABLET | Freq: Three times a day (TID) | ORAL | 0 refills | Status: AC | PRN

## 2024-04-27 MED ORDER — LANCET DEVICE MISC: 5 refills | Status: AC

## 2024-04-27 NOTE — Assessment & Plan Note (Signed)
 Chronic Right side Little improvement Advised f/u with eye doctor since right eye is not closing completely

## 2024-04-27 NOTE — Assessment & Plan Note (Signed)
 Chronic Continue atorvastatin  20 mg daily Check lipids today

## 2024-04-27 NOTE — Assessment & Plan Note (Signed)
 Chronic Intermittent Upper back and legs Methocarbamol 500 mg tid prn

## 2024-04-27 NOTE — Assessment & Plan Note (Signed)
Chronic Stable ? Related to OSA  Referred to neuro for further evaluate OSA severity May need to see hem/onc

## 2024-04-27 NOTE — Assessment & Plan Note (Addendum)
 Chronic GERD not controlled - has GERD at least 2/week and has a chronic cough Continue omeprazole  40 mg daily, but increase to twice weekly

## 2024-04-27 NOTE — Assessment & Plan Note (Signed)
 Chronic ?Check B12 level ?

## 2024-04-27 NOTE — Assessment & Plan Note (Signed)
 Chronic Associated with diabetic neuropathy Not ideally controlled Lab Results  Component Value Date   HGBA1C 8.4 (H) 04/27/2024   Continue Mounjaro  10 to mg weekly, glipizide  10 mg 2 times a day Will adjust medication to get A1c less than 7

## 2024-04-27 NOTE — Assessment & Plan Note (Signed)
Chronic Controlled Continue duloxetine 60 mg daily

## 2024-04-27 NOTE — Assessment & Plan Note (Signed)
 Chronic Continue Lyrica  75 mg bid, cymbalta  60 mg daily Stressed sugar control

## 2024-04-28 ENCOUNTER — Ambulatory Visit: Payer: Self-pay | Admitting: Internal Medicine

## 2024-05-05 ENCOUNTER — Other Ambulatory Visit: Payer: Self-pay

## 2024-05-05 MED ORDER — TIRZEPATIDE 12.5 MG/0.5ML ~~LOC~~ SOAJ: 12.5000 mg | SUBCUTANEOUS | 0 refills | Status: DC

## 2024-05-25 ENCOUNTER — Encounter: Payer: Self-pay | Admitting: Podiatry

## 2024-06-11 ENCOUNTER — Ambulatory Visit: Payer: Medicare (Managed Care) | Admitting: Internal Medicine

## 2024-06-14 ENCOUNTER — Ambulatory Visit (INDEPENDENT_AMBULATORY_CARE_PROVIDER_SITE_OTHER): Payer: Medicare (Managed Care) | Admitting: Podiatry

## 2024-06-14 ENCOUNTER — Encounter: Payer: Self-pay | Admitting: Podiatry

## 2024-06-14 DIAGNOSIS — M216X1 Other acquired deformities of right foot: Secondary | ICD-10-CM

## 2024-06-14 DIAGNOSIS — M216X2 Other acquired deformities of left foot: Secondary | ICD-10-CM

## 2024-06-14 DIAGNOSIS — L84 Corns and callosities: Secondary | ICD-10-CM | POA: Diagnosis not present

## 2024-06-14 DIAGNOSIS — M722 Plantar fascial fibromatosis: Secondary | ICD-10-CM

## 2024-06-14 NOTE — Patient Instructions (Signed)

## 2024-06-14 NOTE — Progress Notes (Unsigned)
 Right helel pain, dry pf

## 2024-06-15 ENCOUNTER — Other Ambulatory Visit: Payer: Self-pay | Admitting: Internal Medicine

## 2024-06-16 ENCOUNTER — Other Ambulatory Visit: Payer: Self-pay | Admitting: Internal Medicine

## 2024-06-18 ENCOUNTER — Other Ambulatory Visit: Payer: Self-pay

## 2024-06-18 MED ORDER — TIRZEPATIDE 12.5 MG/0.5ML ~~LOC~~ SOAJ: 12.5000 mg | SUBCUTANEOUS | 0 refills | Status: AC

## 2024-06-21 ENCOUNTER — Institutional Professional Consult (permissible substitution): Payer: Medicare (Managed Care) | Admitting: Neurology

## 2024-06-25 ENCOUNTER — Other Ambulatory Visit: Payer: Self-pay | Admitting: Internal Medicine

## 2024-07-13 ENCOUNTER — Encounter: Payer: Self-pay | Admitting: Neurology

## 2024-07-13 ENCOUNTER — Institutional Professional Consult (permissible substitution): Payer: Medicare (Managed Care) | Admitting: Neurology

## 2024-07-26 NOTE — Progress Notes (Unsigned)
 "     Subjective:    Patient ID: Monica Welch, female    DOB: 1961-05-07, 64 y.o.   MRN: [id number]     HPI Monica Welch is here for follow up of her chronic medical problems.    Medications and allergies reviewed with patient and updated if appropriate.  Medications Ordered Prior to Encounter[1]   Review of Systems     Objective:  There were no vitals filed for this visit. BP Readings from Last 3 Encounters:  04/27/24 122/82  03/05/24 120/78  02/28/24 114/79   Wt Readings from Last 3 Encounters:  04/27/24 180 lb (81.6 kg)  03/05/24 184 lb (83.5 kg)  02/28/24 183 lb 6.8 oz (83.2 kg)   There is no height or weight on file to calculate BMI.    Physical Exam     Lab Results  Component Value Date   WBC 10.2 04/27/2024   HGB 15.2 (H) 04/27/2024   HCT 45.6 04/27/2024   PLT 235.0 04/27/2024   GLUCOSE 150 (H) 04/27/2024   CHOL 149 01/26/2024   TRIG 154.0 (H) 01/26/2024   HDL 37.50 (L) 01/26/2024   LDLDIRECT 135.0 03/04/2022   LDLCALC 81 01/26/2024   ALT 26 04/27/2024   AST 22 04/27/2024   NA 139 04/27/2024   K 3.9 04/27/2024   CL 103 04/27/2024   CREATININE 0.93 04/27/2024   BUN 15 04/27/2024   CO2 27 04/27/2024   TSH 1.25 03/04/2022   INR 1.0 05/06/2022   HGBA1C 8.4 (H) 04/27/2024   MICROALBUR 4.2 (H) 10/28/2023     Assessment & Plan:    See Problem List for Assessment and Plan of chronic medical problems.       [1]  Current Outpatient Medications on File Prior to Visit  Medication Sig Dispense Refill   Accu-Chek Softclix Lancets lancets USE AS DIRECTED, CHECK TWICE DAILY 100 each 0   atorvastatin  (LIPITOR) 20 MG tablet TAKE 1 TABLET(20 MG) BY MOUTH DAILY 90 tablet 1   Blood Glucose Monitoring Suppl (ACCU-CHEK GUIDE ME) w/Device KIT USE TO TEST BLOOD GLUCOSE EVERY MORNING, NOON, AND AT BEDTIME 1 kit 0   cyanocobalamin  (VITAMIN B12) 1000 MCG tablet Take 1 tablet (1,000 mcg total) by mouth daily. 90 tablet 3   DULoxetine  (CYMBALTA ) 60 MG  capsule TAKE 1 CAPSULE(60 MG) BY MOUTH DAILY 90 capsule 1   glipiZIDE  (GLUCOTROL ) 10 MG tablet TAKE 1 TABLET(10 MG) BY MOUTH TWICE DAILY BEFORE A MEAL 180 tablet 1   glucose blood (ACCU-CHEK GUIDE TEST) test strip USE AS DIRECTED TWICE DAILY 100 strip 0   Glucose Blood (BLOOD GLUCOSE TEST STRIPS) STRP May substitute to any manufacturer covered by patient's insurance. 100 strip 5   Lancet Device MISC Use up to 3 times daily to check glucose. May substitute to any manufacturer covered by patient's insurance. 1 each 5   methocarbamol  (ROBAXIN ) 500 MG tablet Take 1 tablet (500 mg total) by mouth every 8 (eight) hours as needed for muscle spasms. 30 tablet 0   omeprazole  (PRILOSEC) 40 MG capsule TAKE 1 CAPSULE(40 MG) BY MOUTH DAILY 30 MINUTES BEFORE A MEAL 90 capsule 1   pregabalin  (LYRICA ) 100 MG capsule Take 1 capsule (100 mg total) by mouth 2 (two) times daily. 60 capsule 0   tirzepatide  (MOUNJARO ) 10 MG/0.5ML Pen Inject 10 mg into the skin once a week. 6 mL 0   tirzepatide  (MOUNJARO ) 12.5 MG/0.5ML Pen Inject 12.5 mg into the skin once a week. 6 mL 0  No current facility-administered medications on file prior to visit.   "

## 2024-07-28 ENCOUNTER — Ambulatory Visit: Payer: Medicare (Managed Care) | Admitting: Internal Medicine

## 2024-07-28 DIAGNOSIS — E1142 Type 2 diabetes mellitus with diabetic polyneuropathy: Secondary | ICD-10-CM

## 2024-07-28 DIAGNOSIS — E1169 Type 2 diabetes mellitus with other specified complication: Secondary | ICD-10-CM

## 2024-07-28 DIAGNOSIS — E1140 Type 2 diabetes mellitus with diabetic neuropathy, unspecified: Secondary | ICD-10-CM

## 2024-07-28 DIAGNOSIS — E66813 Obesity, class 3: Secondary | ICD-10-CM

## 2024-07-29 ENCOUNTER — Encounter: Payer: Self-pay | Admitting: Internal Medicine

## 2024-07-29 NOTE — Progress Notes (Unsigned)
" ° ° ° ° °  Subjective:    Patient ID: Monica Welch, female    DOB: 1961/06/13, 64 y.o.   MRN: [id number]     HPI Davie is here for follow up of her chronic medical problems.  He has meningitis vaccines-pharmacy  Medications and allergies reviewed with patient and updated if appropriate.  Medications Ordered Prior to Encounter[1]   Review of Systems     Objective:  There were no vitals filed for this visit. BP Readings from Last 3 Encounters:  04/27/24 122/82  03/05/24 120/78  02/28/24 114/79   Wt Readings from Last 3 Encounters:  04/27/24 180 lb (81.6 kg)  03/05/24 184 lb (83.5 kg)  02/28/24 183 lb 6.8 oz (83.2 kg)   There is no height or weight on file to calculate BMI.    Physical Exam     Lab Results  Component Value Date   WBC 10.2 04/27/2024   HGB 15.2 (H) 04/27/2024   HCT 45.6 04/27/2024   PLT 235.0 04/27/2024   GLUCOSE 150 (H) 04/27/2024   CHOL 149 01/26/2024   TRIG 154.0 (H) 01/26/2024   HDL 37.50 (L) 01/26/2024   LDLDIRECT 135.0 03/04/2022   LDLCALC 81 01/26/2024   ALT 26 04/27/2024   AST 22 04/27/2024   NA 139 04/27/2024   K 3.9 04/27/2024   CL 103 04/27/2024   CREATININE 0.93 04/27/2024   BUN 15 04/27/2024   CO2 27 04/27/2024   TSH 1.25 03/04/2022   INR 1.0 05/06/2022   HGBA1C 8.4 (H) 04/27/2024   MICROALBUR 4.2 (H) 10/28/2023     Assessment & Plan:    See Problem List for Assessment and Plan of chronic medical problems.        [1]  Current Outpatient Medications on File Prior to Visit  Medication Sig Dispense Refill   Accu-Chek Softclix Lancets lancets USE AS DIRECTED, CHECK TWICE DAILY 100 each 0   atorvastatin  (LIPITOR) 20 MG tablet TAKE 1 TABLET(20 MG) BY MOUTH DAILY 90 tablet 1   Blood Glucose Monitoring Suppl (ACCU-CHEK GUIDE ME) w/Device KIT USE TO TEST BLOOD GLUCOSE EVERY MORNING, NOON, AND AT BEDTIME 1 kit 0   cyanocobalamin  (VITAMIN B12) 1000 MCG tablet Take 1 tablet (1,000 mcg total) by mouth daily. 90 tablet  3   DULoxetine  (CYMBALTA ) 60 MG capsule TAKE 1 CAPSULE(60 MG) BY MOUTH DAILY 90 capsule 1   glipiZIDE  (GLUCOTROL ) 10 MG tablet TAKE 1 TABLET(10 MG) BY MOUTH TWICE DAILY BEFORE A MEAL 180 tablet 1   glucose blood (ACCU-CHEK GUIDE TEST) test strip USE AS DIRECTED TWICE DAILY 100 strip 0   Glucose Blood (BLOOD GLUCOSE TEST STRIPS) STRP May substitute to any manufacturer covered by patient's insurance. 100 strip 5   Lancet Device MISC Use up to 3 times daily to check glucose. May substitute to any manufacturer covered by patient's insurance. 1 each 5   methocarbamol  (ROBAXIN ) 500 MG tablet Take 1 tablet (500 mg total) by mouth every 8 (eight) hours as needed for muscle spasms. 30 tablet 0   omeprazole  (PRILOSEC) 40 MG capsule TAKE 1 CAPSULE(40 MG) BY MOUTH DAILY 30 MINUTES BEFORE A MEAL 90 capsule 1   pregabalin  (LYRICA ) 100 MG capsule Take 1 capsule (100 mg total) by mouth 2 (two) times daily. 60 capsule 0   tirzepatide  (MOUNJARO ) 12.5 MG/0.5ML Pen Inject 12.5 mg into the skin once a week. 6 mL 0   No current facility-administered medications on file prior to visit.   "

## 2024-07-29 NOTE — Patient Instructions (Addendum)
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  You need Menveo/Menactra + Men B or a combination vaccine if available     Medications changes include :   None    A referral was ordered and someone will call you to schedule an appointment.     Return in about 4 months (around 11/27/2024) for follow up.  "

## 2024-07-30 ENCOUNTER — Ambulatory Visit: Payer: Self-pay

## 2024-07-30 ENCOUNTER — Ambulatory Visit: Admitting: Internal Medicine

## 2024-07-30 DIAGNOSIS — E1142 Type 2 diabetes mellitus with diabetic polyneuropathy: Secondary | ICD-10-CM

## 2024-07-30 DIAGNOSIS — E1140 Type 2 diabetes mellitus with diabetic neuropathy, unspecified: Secondary | ICD-10-CM

## 2024-07-30 DIAGNOSIS — F419 Anxiety disorder, unspecified: Secondary | ICD-10-CM

## 2024-07-30 DIAGNOSIS — K219 Gastro-esophageal reflux disease without esophagitis: Secondary | ICD-10-CM

## 2024-07-30 DIAGNOSIS — E1169 Type 2 diabetes mellitus with other specified complication: Secondary | ICD-10-CM

## 2024-07-30 NOTE — Telephone Encounter (Signed)
 FYI Only or Action Required?: FYI only for provider: URGENT CARE.  Patient was last seen in primary care on 04/27/2024 by Geofm Glade PARAS, MD.  Called Nurse Triage reporting Fall.  Symptoms began today.  Interventions attempted: OTC medications: ibuprofen , Prescription medications: muscle relaxer, and Rest, hydration, or home remedies.  Symptoms are: gradually worsening.  Triage Disposition: Go to ED Now (Notify PCP)  Patient/caregiver understands and will follow disposition?: Yes  Message from [place] sent at 07/30/2024  8:35 AM EST  Reason for Triage: fall + extreme pain in buttock area   Reason for Disposition  Injury (or injuries) that need emergency care  Answer Assessment - Initial Assessment Questions Was walking her dog this morning before she was to go to AM appt. Left her phone at home. Dog pulled her onto ice and she fell onto her hands and knees. She twisted and pulled her right sciatica. It took her to walk back to the house due to pain 10/10. Hard to put weight on her leg due to pain. Ibuprofen  but hasn't had any relief yet.   Denies weakness, paresthesias, bleeding. Has scrapes on her hands.   Patient to be seen in UC or ED to assess.   1. MECHANISM: How did the fall happen?     Dog pulled  2. DOMESTIC VIOLENCE AND ELDER ABUSE SCREENING: Did you fall because someone pushed you or tried to hurt you? If Yes, ask: Are you safe now?     Denies  3. ONSET: When did the fall happen? (e.g., minutes, hours, or days ago)     Today- hour ago  4. LOCATION: What part of the body hit the ground? (e.g., back, buttocks, head, hips, knees, hands, head, stomach)     Fell onto her hands and knees- but twisted and pulled her hip  5. INJURY: Did you hurt (injure) yourself when you fell? If Yes, ask: What did you injure? Tell me more about this? (e.g., body area; type of injury; pain severity)     Right hip- pulled sciatica 6. PAIN: Is there any pain? If Yes,  ask: How bad is the pain? (e.g., Scale 0-10; or none, mild,      10/10 7. SIZE: For cuts, bruises, or swelling, ask: How large is it? (e.g., inches or centimeters)      Scraped hands  9. OTHER SYMPTOMS: Do you have any other symptoms? (e.g., dizziness, fever, weakness; new-onset or worsening).      Denies  10. CAUSE: What do you think caused the fall (or falling)? (e.g., dizzy spell, tripped)       Dog pulled her onto ice and fell  Protocols used: Falls and [facility]

## 2024-07-30 NOTE — Assessment & Plan Note (Signed)
 Chronic Continue Lyrica  75 mg bid, cymbalta  60 mg daily Stressed sugar control

## 2024-07-30 NOTE — Assessment & Plan Note (Signed)
 Chronic Associated with diabetic neuropathy, hyperlipidemia Not ideally controlled Lab Results  Component Value Date   HGBA1C 8.4 (H) 04/27/2024   Continue Mounjaro  12.5 mg weekly, glipizide  10 mg 2 times a day A1c today

## 2024-07-30 NOTE — Assessment & Plan Note (Signed)
 Chronic GERD improved Continue omeprazole  40 mg twice daily

## 2024-07-30 NOTE — Assessment & Plan Note (Signed)
Chronic Controlled Continue duloxetine 60 mg daily

## 2024-07-30 NOTE — Assessment & Plan Note (Signed)
 Chronic BMI On Mounjaro  for diabetes which has helped her lose weight Encouraged regular exercise, healthy diet high in protein and low in sugars and carbs

## 2024-07-30 NOTE — Assessment & Plan Note (Signed)
 Chronic Lab Results  Component Value Date   LDLCALC 81 01/26/2024   Continue atorvastatin  20 mg daily

## 2024-09-13 ENCOUNTER — Ambulatory Visit: Payer: Medicare (Managed Care) | Admitting: Podiatry

## 2024-10-29 ENCOUNTER — Ambulatory Visit: Payer: Medicare (Managed Care)

## 2024-10-29 ENCOUNTER — Encounter: Payer: Medicare (Managed Care) | Admitting: Internal Medicine
